# Patient Record
Sex: Male | Born: 1945 | Race: White | Hispanic: No | Marital: Married | Smoking: Former smoker
Health system: Southern US, Community
[De-identification: ages and names within clinical notes are randomized; demographics above are authoritative.]

## PROBLEM LIST (undated history)

## (undated) DIAGNOSIS — E669 Obesity, unspecified: Secondary | ICD-10-CM

## (undated) DIAGNOSIS — N529 Male erectile dysfunction, unspecified: Secondary | ICD-10-CM

## (undated) DIAGNOSIS — J4540 Moderate persistent asthma, uncomplicated: Secondary | ICD-10-CM

## (undated) DIAGNOSIS — R7989 Other specified abnormal findings of blood chemistry: Secondary | ICD-10-CM

## (undated) DIAGNOSIS — E7800 Pure hypercholesterolemia, unspecified: Secondary | ICD-10-CM

## (undated) DIAGNOSIS — K429 Umbilical hernia without obstruction or gangrene: Secondary | ICD-10-CM

## (undated) DIAGNOSIS — E291 Testicular hypofunction: Secondary | ICD-10-CM

## (undated) DIAGNOSIS — E559 Vitamin D deficiency, unspecified: Secondary | ICD-10-CM

## (undated) DIAGNOSIS — J339 Nasal polyp, unspecified: Secondary | ICD-10-CM

## (undated) DIAGNOSIS — B351 Tinea unguium: Secondary | ICD-10-CM

## (undated) DIAGNOSIS — J4520 Mild intermittent asthma, uncomplicated: Secondary | ICD-10-CM

## (undated) DIAGNOSIS — G4733 Obstructive sleep apnea (adult) (pediatric): Secondary | ICD-10-CM

## (undated) DIAGNOSIS — J45909 Unspecified asthma, uncomplicated: Secondary | ICD-10-CM

## (undated) DIAGNOSIS — J3089 Other allergic rhinitis: Secondary | ICD-10-CM

## (undated) DIAGNOSIS — F3341 Major depressive disorder, recurrent, in partial remission: Secondary | ICD-10-CM

## (undated) DIAGNOSIS — M7740 Metatarsalgia, unspecified foot: Secondary | ICD-10-CM

## (undated) DIAGNOSIS — I10 Essential (primary) hypertension: Secondary | ICD-10-CM

## (undated) DIAGNOSIS — E785 Hyperlipidemia, unspecified: Secondary | ICD-10-CM

## (undated) DIAGNOSIS — J309 Allergic rhinitis, unspecified: Secondary | ICD-10-CM

## (undated) HISTORY — DX: Unspecified asthma, uncomplicated: J45.909

## (undated) HISTORY — DX: Obesity, unspecified: E66.9

## (undated) HISTORY — DX: Tinea unguium: B35.1

## (undated) HISTORY — DX: Vitamin D deficiency, unspecified: E55.9

## (undated) HISTORY — DX: Nasal polyp, unspecified: J33.9

## (undated) HISTORY — DX: Other allergic rhinitis: J30.89

## (undated) HISTORY — DX: Male erectile dysfunction, unspecified: N52.9

## (undated) HISTORY — DX: Moderate persistent asthma, uncomplicated: J45.40

## (undated) HISTORY — DX: Allergic rhinitis, unspecified: J30.9

## (undated) HISTORY — DX: Mild intermittent asthma, uncomplicated: J45.20

## (undated) HISTORY — DX: Pure hypercholesterolemia, unspecified: E78.00

## (undated) HISTORY — DX: Hyperlipidemia, unspecified: E78.5

## (undated) HISTORY — PX: OTHER SURGICAL HISTORY: SHX169

## (undated) HISTORY — DX: Metatarsalgia, unspecified foot: M77.40

## (undated) HISTORY — PX: EYE SURGERY: SHX253

## (undated) HISTORY — DX: Testicular hypofunction: E29.1

## (undated) HISTORY — DX: Essential (primary) hypertension: I10

## (undated) HISTORY — DX: Major depressive disorder, recurrent, in partial remission: F33.41

## (undated) HISTORY — DX: Other specified abnormal findings of blood chemistry: R79.89

## (undated) HISTORY — DX: Obstructive sleep apnea (adult) (pediatric): G47.33

## (undated) HISTORY — PX: CARDIAC CATHETERIZATION: SHX172

---

## 2000-02-26 DIAGNOSIS — I10 Essential (primary) hypertension: Secondary | ICD-10-CM | POA: Insufficient documentation

## 2001-03-07 DIAGNOSIS — L209 Atopic dermatitis, unspecified: Secondary | ICD-10-CM | POA: Insufficient documentation

## 2001-03-16 DIAGNOSIS — E7800 Pure hypercholesterolemia, unspecified: Secondary | ICD-10-CM | POA: Insufficient documentation

## 2003-03-23 DIAGNOSIS — G4730 Sleep apnea, unspecified: Secondary | ICD-10-CM | POA: Insufficient documentation

## 2004-04-25 DIAGNOSIS — I872 Venous insufficiency (chronic) (peripheral): Secondary | ICD-10-CM | POA: Insufficient documentation

## 2008-11-09 DIAGNOSIS — E349 Endocrine disorder, unspecified: Secondary | ICD-10-CM | POA: Insufficient documentation

## 2008-11-09 DIAGNOSIS — N529 Male erectile dysfunction, unspecified: Secondary | ICD-10-CM | POA: Insufficient documentation

## 2012-07-28 DIAGNOSIS — J3089 Other allergic rhinitis: Secondary | ICD-10-CM | POA: Diagnosis not present

## 2012-08-18 DIAGNOSIS — L820 Inflamed seborrheic keratosis: Secondary | ICD-10-CM | POA: Diagnosis not present

## 2012-08-23 DIAGNOSIS — J3089 Other allergic rhinitis: Secondary | ICD-10-CM | POA: Diagnosis not present

## 2012-08-24 DIAGNOSIS — J3089 Other allergic rhinitis: Secondary | ICD-10-CM | POA: Diagnosis not present

## 2012-08-30 DIAGNOSIS — IMO0002 Reserved for concepts with insufficient information to code with codable children: Secondary | ICD-10-CM | POA: Diagnosis not present

## 2012-08-30 DIAGNOSIS — L509 Urticaria, unspecified: Secondary | ICD-10-CM | POA: Diagnosis not present

## 2012-08-30 DIAGNOSIS — I10 Essential (primary) hypertension: Secondary | ICD-10-CM | POA: Diagnosis not present

## 2012-08-30 DIAGNOSIS — T783XXA Angioneurotic edema, initial encounter: Secondary | ICD-10-CM | POA: Diagnosis not present

## 2012-08-31 DIAGNOSIS — J339 Nasal polyp, unspecified: Secondary | ICD-10-CM | POA: Diagnosis not present

## 2012-08-31 DIAGNOSIS — J3089 Other allergic rhinitis: Secondary | ICD-10-CM | POA: Diagnosis not present

## 2012-08-31 DIAGNOSIS — J449 Chronic obstructive pulmonary disease, unspecified: Secondary | ICD-10-CM | POA: Diagnosis not present

## 2012-08-31 DIAGNOSIS — J45902 Unspecified asthma with status asthmaticus: Secondary | ICD-10-CM | POA: Diagnosis not present

## 2012-08-31 DIAGNOSIS — J4489 Other specified chronic obstructive pulmonary disease: Secondary | ICD-10-CM | POA: Diagnosis not present

## 2012-08-31 DIAGNOSIS — J329 Chronic sinusitis, unspecified: Secondary | ICD-10-CM | POA: Diagnosis not present

## 2012-09-08 DIAGNOSIS — I10 Essential (primary) hypertension: Secondary | ICD-10-CM | POA: Diagnosis not present

## 2012-09-08 DIAGNOSIS — H6120 Impacted cerumen, unspecified ear: Secondary | ICD-10-CM | POA: Diagnosis not present

## 2012-09-20 DIAGNOSIS — J3089 Other allergic rhinitis: Secondary | ICD-10-CM | POA: Diagnosis not present

## 2012-10-19 DIAGNOSIS — J3089 Other allergic rhinitis: Secondary | ICD-10-CM | POA: Diagnosis not present

## 2012-11-03 DIAGNOSIS — J3089 Other allergic rhinitis: Secondary | ICD-10-CM | POA: Diagnosis not present

## 2012-11-18 DIAGNOSIS — J3089 Other allergic rhinitis: Secondary | ICD-10-CM | POA: Diagnosis not present

## 2012-12-15 DIAGNOSIS — J3089 Other allergic rhinitis: Secondary | ICD-10-CM | POA: Diagnosis not present

## 2012-12-27 DIAGNOSIS — G4733 Obstructive sleep apnea (adult) (pediatric): Secondary | ICD-10-CM | POA: Diagnosis not present

## 2013-01-16 DIAGNOSIS — J3089 Other allergic rhinitis: Secondary | ICD-10-CM | POA: Diagnosis not present

## 2013-02-13 DIAGNOSIS — J3089 Other allergic rhinitis: Secondary | ICD-10-CM | POA: Diagnosis not present

## 2013-03-07 DIAGNOSIS — T782XXA Anaphylactic shock, unspecified, initial encounter: Secondary | ICD-10-CM | POA: Diagnosis not present

## 2013-03-07 DIAGNOSIS — J339 Nasal polyp, unspecified: Secondary | ICD-10-CM | POA: Diagnosis not present

## 2013-03-07 DIAGNOSIS — J3089 Other allergic rhinitis: Secondary | ICD-10-CM | POA: Diagnosis not present

## 2013-03-07 DIAGNOSIS — G4733 Obstructive sleep apnea (adult) (pediatric): Secondary | ICD-10-CM | POA: Diagnosis not present

## 2013-03-07 DIAGNOSIS — J328 Other chronic sinusitis: Secondary | ICD-10-CM | POA: Diagnosis not present

## 2013-03-07 DIAGNOSIS — J4489 Other specified chronic obstructive pulmonary disease: Secondary | ICD-10-CM | POA: Diagnosis not present

## 2013-03-07 DIAGNOSIS — J449 Chronic obstructive pulmonary disease, unspecified: Secondary | ICD-10-CM | POA: Diagnosis not present

## 2013-03-23 DIAGNOSIS — J3089 Other allergic rhinitis: Secondary | ICD-10-CM | POA: Diagnosis not present

## 2013-04-06 DIAGNOSIS — J3089 Other allergic rhinitis: Secondary | ICD-10-CM | POA: Diagnosis not present

## 2013-04-26 DIAGNOSIS — J3089 Other allergic rhinitis: Secondary | ICD-10-CM | POA: Diagnosis not present

## 2013-05-03 DIAGNOSIS — Z125 Encounter for screening for malignant neoplasm of prostate: Secondary | ICD-10-CM | POA: Diagnosis not present

## 2013-05-03 DIAGNOSIS — I10 Essential (primary) hypertension: Secondary | ICD-10-CM | POA: Diagnosis not present

## 2013-05-03 DIAGNOSIS — Z136 Encounter for screening for cardiovascular disorders: Secondary | ICD-10-CM | POA: Diagnosis not present

## 2013-05-03 DIAGNOSIS — Z23 Encounter for immunization: Secondary | ICD-10-CM | POA: Diagnosis not present

## 2013-05-03 DIAGNOSIS — E7800 Pure hypercholesterolemia, unspecified: Secondary | ICD-10-CM | POA: Diagnosis not present

## 2013-05-03 DIAGNOSIS — Z0000 Encounter for general adult medical examination without abnormal findings: Secondary | ICD-10-CM | POA: Diagnosis not present

## 2013-05-08 DIAGNOSIS — J45909 Unspecified asthma, uncomplicated: Secondary | ICD-10-CM | POA: Diagnosis not present

## 2013-05-08 DIAGNOSIS — G4733 Obstructive sleep apnea (adult) (pediatric): Secondary | ICD-10-CM | POA: Diagnosis not present

## 2013-05-15 DIAGNOSIS — Z87891 Personal history of nicotine dependence: Secondary | ICD-10-CM | POA: Diagnosis not present

## 2013-05-15 DIAGNOSIS — Z136 Encounter for screening for cardiovascular disorders: Secondary | ICD-10-CM | POA: Diagnosis not present

## 2013-06-05 DIAGNOSIS — J3089 Other allergic rhinitis: Secondary | ICD-10-CM | POA: Diagnosis not present

## 2013-06-06 DIAGNOSIS — J3089 Other allergic rhinitis: Secondary | ICD-10-CM | POA: Diagnosis not present

## 2013-06-20 DIAGNOSIS — J3089 Other allergic rhinitis: Secondary | ICD-10-CM | POA: Diagnosis not present

## 2013-07-06 DIAGNOSIS — J3089 Other allergic rhinitis: Secondary | ICD-10-CM | POA: Diagnosis not present

## 2013-07-24 DIAGNOSIS — J3089 Other allergic rhinitis: Secondary | ICD-10-CM | POA: Diagnosis not present

## 2013-08-07 DIAGNOSIS — J3089 Other allergic rhinitis: Secondary | ICD-10-CM | POA: Diagnosis not present

## 2013-08-17 DIAGNOSIS — J111 Influenza due to unidentified influenza virus with other respiratory manifestations: Secondary | ICD-10-CM | POA: Diagnosis not present

## 2013-08-31 DIAGNOSIS — J45909 Unspecified asthma, uncomplicated: Secondary | ICD-10-CM | POA: Diagnosis not present

## 2013-08-31 DIAGNOSIS — J3089 Other allergic rhinitis: Secondary | ICD-10-CM | POA: Diagnosis not present

## 2013-08-31 DIAGNOSIS — G4733 Obstructive sleep apnea (adult) (pediatric): Secondary | ICD-10-CM | POA: Diagnosis not present

## 2013-09-06 DIAGNOSIS — J339 Nasal polyp, unspecified: Secondary | ICD-10-CM | POA: Diagnosis not present

## 2013-09-06 DIAGNOSIS — L508 Other urticaria: Secondary | ICD-10-CM | POA: Diagnosis not present

## 2013-09-06 DIAGNOSIS — J329 Chronic sinusitis, unspecified: Secondary | ICD-10-CM | POA: Diagnosis not present

## 2013-09-06 DIAGNOSIS — J45902 Unspecified asthma with status asthmaticus: Secondary | ICD-10-CM | POA: Diagnosis not present

## 2013-09-06 DIAGNOSIS — J3089 Other allergic rhinitis: Secondary | ICD-10-CM | POA: Diagnosis not present

## 2013-09-06 DIAGNOSIS — J4489 Other specified chronic obstructive pulmonary disease: Secondary | ICD-10-CM | POA: Diagnosis not present

## 2013-09-06 DIAGNOSIS — J449 Chronic obstructive pulmonary disease, unspecified: Secondary | ICD-10-CM | POA: Diagnosis not present

## 2013-09-06 DIAGNOSIS — G4733 Obstructive sleep apnea (adult) (pediatric): Secondary | ICD-10-CM | POA: Diagnosis not present

## 2013-09-06 LAB — PULMONARY FUNCTION TEST

## 2013-09-22 DIAGNOSIS — J3089 Other allergic rhinitis: Secondary | ICD-10-CM | POA: Diagnosis not present

## 2013-10-17 DIAGNOSIS — J3089 Other allergic rhinitis: Secondary | ICD-10-CM | POA: Diagnosis not present

## 2013-10-17 DIAGNOSIS — I10 Essential (primary) hypertension: Secondary | ICD-10-CM | POA: Diagnosis not present

## 2013-10-17 DIAGNOSIS — E7800 Pure hypercholesterolemia, unspecified: Secondary | ICD-10-CM | POA: Diagnosis not present

## 2013-10-17 DIAGNOSIS — H6120 Impacted cerumen, unspecified ear: Secondary | ICD-10-CM | POA: Diagnosis not present

## 2013-11-01 DIAGNOSIS — J3089 Other allergic rhinitis: Secondary | ICD-10-CM | POA: Diagnosis not present

## 2013-11-23 DIAGNOSIS — J45902 Unspecified asthma with status asthmaticus: Secondary | ICD-10-CM | POA: Diagnosis not present

## 2013-11-23 DIAGNOSIS — J4489 Other specified chronic obstructive pulmonary disease: Secondary | ICD-10-CM | POA: Diagnosis not present

## 2013-11-23 DIAGNOSIS — T783XXA Angioneurotic edema, initial encounter: Secondary | ICD-10-CM | POA: Diagnosis not present

## 2013-11-23 DIAGNOSIS — J339 Nasal polyp, unspecified: Secondary | ICD-10-CM | POA: Diagnosis not present

## 2013-11-23 DIAGNOSIS — J449 Chronic obstructive pulmonary disease, unspecified: Secondary | ICD-10-CM | POA: Diagnosis not present

## 2013-11-23 DIAGNOSIS — L508 Other urticaria: Secondary | ICD-10-CM | POA: Diagnosis not present

## 2013-11-23 DIAGNOSIS — J329 Chronic sinusitis, unspecified: Secondary | ICD-10-CM | POA: Diagnosis not present

## 2013-11-23 DIAGNOSIS — J3089 Other allergic rhinitis: Secondary | ICD-10-CM | POA: Diagnosis not present

## 2013-11-23 LAB — PULMONARY FUNCTION TEST

## 2013-11-29 DIAGNOSIS — J3089 Other allergic rhinitis: Secondary | ICD-10-CM | POA: Diagnosis not present

## 2013-12-21 DIAGNOSIS — J3089 Other allergic rhinitis: Secondary | ICD-10-CM | POA: Diagnosis not present

## 2013-12-22 DIAGNOSIS — J3089 Other allergic rhinitis: Secondary | ICD-10-CM | POA: Diagnosis not present

## 2014-01-10 DIAGNOSIS — J3089 Other allergic rhinitis: Secondary | ICD-10-CM | POA: Diagnosis not present

## 2014-01-24 DIAGNOSIS — J3089 Other allergic rhinitis: Secondary | ICD-10-CM | POA: Diagnosis not present

## 2014-02-06 DIAGNOSIS — J3089 Other allergic rhinitis: Secondary | ICD-10-CM | POA: Diagnosis not present

## 2014-02-28 DIAGNOSIS — J45909 Unspecified asthma, uncomplicated: Secondary | ICD-10-CM | POA: Diagnosis not present

## 2014-02-28 DIAGNOSIS — J3089 Other allergic rhinitis: Secondary | ICD-10-CM | POA: Diagnosis not present

## 2014-02-28 DIAGNOSIS — G4733 Obstructive sleep apnea (adult) (pediatric): Secondary | ICD-10-CM | POA: Diagnosis not present

## 2014-03-20 DIAGNOSIS — H43819 Vitreous degeneration, unspecified eye: Secondary | ICD-10-CM | POA: Diagnosis not present

## 2014-03-20 DIAGNOSIS — H26499 Other secondary cataract, unspecified eye: Secondary | ICD-10-CM | POA: Diagnosis not present

## 2014-03-20 DIAGNOSIS — J3089 Other allergic rhinitis: Secondary | ICD-10-CM | POA: Diagnosis not present

## 2014-04-01 DIAGNOSIS — G4733 Obstructive sleep apnea (adult) (pediatric): Secondary | ICD-10-CM | POA: Diagnosis not present

## 2014-04-11 DIAGNOSIS — J449 Chronic obstructive pulmonary disease, unspecified: Secondary | ICD-10-CM | POA: Diagnosis not present

## 2014-04-11 DIAGNOSIS — T783XXA Angioneurotic edema, initial encounter: Secondary | ICD-10-CM | POA: Diagnosis not present

## 2014-04-11 DIAGNOSIS — G4733 Obstructive sleep apnea (adult) (pediatric): Secondary | ICD-10-CM | POA: Diagnosis not present

## 2014-04-11 DIAGNOSIS — J338 Other polyp of sinus: Secondary | ICD-10-CM | POA: Diagnosis not present

## 2014-04-11 DIAGNOSIS — J329 Chronic sinusitis, unspecified: Secondary | ICD-10-CM | POA: Diagnosis not present

## 2014-04-11 DIAGNOSIS — J4489 Other specified chronic obstructive pulmonary disease: Secondary | ICD-10-CM | POA: Diagnosis not present

## 2014-04-11 DIAGNOSIS — J45902 Unspecified asthma with status asthmaticus: Secondary | ICD-10-CM | POA: Diagnosis not present

## 2014-04-11 DIAGNOSIS — J3089 Other allergic rhinitis: Secondary | ICD-10-CM | POA: Diagnosis not present

## 2014-04-11 LAB — PULMONARY FUNCTION TEST

## 2014-04-19 DIAGNOSIS — G4733 Obstructive sleep apnea (adult) (pediatric): Secondary | ICD-10-CM | POA: Diagnosis not present

## 2014-04-23 DIAGNOSIS — H26493 Other secondary cataract, bilateral: Secondary | ICD-10-CM | POA: Diagnosis not present

## 2014-04-23 DIAGNOSIS — H35373 Puckering of macula, bilateral: Secondary | ICD-10-CM | POA: Diagnosis not present

## 2014-04-23 DIAGNOSIS — H43811 Vitreous degeneration, right eye: Secondary | ICD-10-CM | POA: Diagnosis not present

## 2014-04-23 DIAGNOSIS — H524 Presbyopia: Secondary | ICD-10-CM | POA: Diagnosis not present

## 2014-05-07 DIAGNOSIS — J302 Other seasonal allergic rhinitis: Secondary | ICD-10-CM | POA: Diagnosis not present

## 2014-05-10 DIAGNOSIS — Z23 Encounter for immunization: Secondary | ICD-10-CM | POA: Diagnosis not present

## 2014-05-21 DIAGNOSIS — E785 Hyperlipidemia, unspecified: Secondary | ICD-10-CM | POA: Diagnosis not present

## 2014-05-21 DIAGNOSIS — Z125 Encounter for screening for malignant neoplasm of prostate: Secondary | ICD-10-CM | POA: Diagnosis not present

## 2014-05-21 DIAGNOSIS — I509 Heart failure, unspecified: Secondary | ICD-10-CM | POA: Diagnosis not present

## 2014-05-21 DIAGNOSIS — E291 Testicular hypofunction: Secondary | ICD-10-CM | POA: Diagnosis not present

## 2014-05-21 DIAGNOSIS — J309 Allergic rhinitis, unspecified: Secondary | ICD-10-CM | POA: Diagnosis not present

## 2014-05-31 DIAGNOSIS — J9801 Acute bronchospasm: Secondary | ICD-10-CM | POA: Diagnosis not present

## 2014-05-31 DIAGNOSIS — J218 Acute bronchiolitis due to other specified organisms: Secondary | ICD-10-CM | POA: Diagnosis not present

## 2014-06-04 DIAGNOSIS — J302 Other seasonal allergic rhinitis: Secondary | ICD-10-CM | POA: Diagnosis not present

## 2014-06-05 ENCOUNTER — Encounter: Payer: Self-pay | Admitting: Pulmonary Disease

## 2014-06-05 ENCOUNTER — Encounter (INDEPENDENT_AMBULATORY_CARE_PROVIDER_SITE_OTHER): Payer: Self-pay

## 2014-06-05 ENCOUNTER — Ambulatory Visit (INDEPENDENT_AMBULATORY_CARE_PROVIDER_SITE_OTHER): Payer: Medicare Other | Admitting: Pulmonary Disease

## 2014-06-05 VITALS — BP 124/70 | HR 69 | Temp 97.0°F | Ht 70.0 in | Wt 313.2 lb

## 2014-06-05 DIAGNOSIS — G4733 Obstructive sleep apnea (adult) (pediatric): Secondary | ICD-10-CM | POA: Diagnosis not present

## 2014-06-05 DIAGNOSIS — J45909 Unspecified asthma, uncomplicated: Secondary | ICD-10-CM | POA: Diagnosis not present

## 2014-06-05 DIAGNOSIS — I509 Heart failure, unspecified: Secondary | ICD-10-CM | POA: Diagnosis not present

## 2014-06-05 DIAGNOSIS — E785 Hyperlipidemia, unspecified: Secondary | ICD-10-CM | POA: Diagnosis not present

## 2014-06-05 DIAGNOSIS — I10 Essential (primary) hypertension: Secondary | ICD-10-CM | POA: Diagnosis not present

## 2014-06-05 NOTE — Progress Notes (Signed)
Subjective:    Patient ID: Erik Wiggins, male    DOB: 1946/07/07, 68 y.o.   MRN: [id number]  HPI The patient is a 68 year old male who comes in today as a self-referral to establish for management of obstructive sleep apnea. He underwent a sleep study in 2012 in [place] which showed severe obstructive sleep apnea, and was started on C Pap appropriately. He is currently on a pressure of 14 cm of water, and uses a full face mask. His supplies are fairly up-to-date. He feels that he has done very well with the C Pap device, and currently is satisfied with his sleep and daytime alertness. He feels rested in the mornings upon arising. He has just moved to the [place] area, and needs to establish with a home care company. His current machine is only about 75-38 years old.   Sleep Questionnaire What time do you typically go to bed?( Between what hours) 10pm-12AM 10pm-12AM at 1416 on 06/05/14 by Inge Rise, CMA How long does it take you to fall asleep? 10 min 10 min at 1416 on 06/05/14 by Inge Rise, CMA How many times during the night do you wake up? 4 4 at 1416 on 06/05/14 by Inge Rise, CMA What time do you get out of bed to start your day? 0730 0730 at 1416 on 06/05/14 by Inge Rise, CMA Do you drive or operate heavy machinery in your occupation? No No at 1416 on 06/05/14 by Inge Rise, CMA How much has your weight changed (up or down) over the past two years? (In pounds) 10 lb (4.536 kg) 10 lb (4.536 kg) at 1416 on 06/05/14 by Inge Rise, CMA Have you ever had a sleep study before? Yes Yes at 1416 on 06/05/14 by Inge Rise, CMA If yes, location of study? [place] at 1416 on 06/05/14 by Inge Rise, CMA If yes, date of study? 3-4 years ago 3-4 years ago at 1416 on 06/05/14 by Inge Rise, CMA Do you currently use CPAP? Yes Yes at 1416 on 06/05/14 by Inge Rise, CMA If so, what pressure? 14 14 at 1416 on 06/05/14 by Inge Rise, CMA  Do you wear oxygen at any time? No No at 1416 on 06/05/14 by Inge Rise, CMA   Review of Systems  Constitutional: Negative for fever and unexpected weight change.  HENT: Positive for congestion. Negative for dental problem, ear pain, nosebleeds, postnasal drip, rhinorrhea, sinus pressure, sneezing, sore throat and trouble swallowing.   Eyes: Negative for redness and itching.  Respiratory: Positive for cough and shortness of breath. Negative for chest tightness and wheezing.   Cardiovascular: Negative for palpitations and leg swelling.  Gastrointestinal: Negative for nausea and vomiting.  Genitourinary: Negative for dysuria.  Musculoskeletal: Positive for arthralgias. Negative for joint swelling.  Skin: Negative for rash.  Neurological: Negative for headaches.  Hematological: Does not bruise/bleed easily.  Psychiatric/Behavioral: Negative for dysphoric mood. The patient is not nervous/anxious.        Objective:   Physical Exam Constitutional:  Obese male, no acute distress  HENT:  Nares patent without discharge, large turbinates noted.  Oropharynx without exudate, palate and uvula are thick and elongated.  Eyes:  Perrla, eomi, no scleral icterus  Neck:  No JVD, no TMG  Cardiovascular:  Normal rate, regular rhythm, no rubs or gallops.  No murmurs        Intact distal pulses  Pulmonary :  Normal  breath sounds, no stridor or respiratory distress   No rales, rhonchi, or wheezing  Abdominal:  Soft, nondistended, bowel sounds present.  No tenderness noted.   Musculoskeletal:  mild lower extremity edema noted.  Lymph Nodes:  No cervical lymphadenopathy noted  Skin:  No cyanosis noted  Neurologic:  Alert, appropriate, moves all 4 extremities without obvious deficit.         Assessment & Plan:

## 2014-06-05 NOTE — Patient Instructions (Signed)
Will refer to a new home care company here in town for supplies. Work on weight loss If doing well, followup with me again in one year.

## 2014-06-05 NOTE — Assessment & Plan Note (Signed)
The patient has a history of severe obstructive sleep apnea, but has done very well since being on C Pap. He has now moved to the [place] area, and wishes to establish with a home care company. He feels that he sleeps well with his device, has a mask that fits very well, and is satisfied with his daytime alertness. I have asked him to keep up with his mask changes and supplies, and to work aggressively on weight loss. I will see him back in one year if he continues to do well.

## 2014-06-21 ENCOUNTER — Encounter: Payer: Self-pay | Admitting: Internal Medicine

## 2014-06-21 ENCOUNTER — Ambulatory Visit (INDEPENDENT_AMBULATORY_CARE_PROVIDER_SITE_OTHER): Payer: Medicare Other | Admitting: Internal Medicine

## 2014-06-21 VITALS — BP 130/62 | HR 70 | Ht 70.0 in | Wt 314.4 lb

## 2014-06-21 DIAGNOSIS — J3089 Other allergic rhinitis: Secondary | ICD-10-CM

## 2014-06-21 DIAGNOSIS — J339 Nasal polyp, unspecified: Secondary | ICD-10-CM

## 2014-06-21 DIAGNOSIS — J302 Other seasonal allergic rhinitis: Secondary | ICD-10-CM

## 2014-06-21 DIAGNOSIS — E669 Obesity, unspecified: Secondary | ICD-10-CM | POA: Diagnosis not present

## 2014-06-21 DIAGNOSIS — G4733 Obstructive sleep apnea (adult) (pediatric): Secondary | ICD-10-CM | POA: Diagnosis not present

## 2014-06-21 DIAGNOSIS — J309 Allergic rhinitis, unspecified: Secondary | ICD-10-CM | POA: Diagnosis not present

## 2014-06-21 HISTORY — DX: Obesity, unspecified: E66.9

## 2014-06-21 HISTORY — DX: Other allergic rhinitis: J30.89

## 2014-06-21 HISTORY — DX: Nasal polyp, unspecified: J33.9

## 2014-06-21 NOTE — Assessment & Plan Note (Signed)
He feels allergy vaccine has helped.  Plan- records requested. We can give his shots here and take over as his current vaccine runs out. Medication adjustment as needed.

## 2014-06-21 NOTE — Assessment & Plan Note (Signed)
Managed by Dr Gwenette Greet. Obesity, very narrow pharynx and nasal congestion all contribute.

## 2014-06-21 NOTE — Assessment & Plan Note (Signed)
Plan-continue nasal steroid with discussion

## 2014-06-21 NOTE — Assessment & Plan Note (Signed)
Significant obesity Plan-encourage weight loss

## 2014-06-21 NOTE — Patient Instructions (Signed)
When we get your records and left-over vaccine we will be able to continue your allergy vaccine here.   Please call if we can help

## 2014-06-21 NOTE — Progress Notes (Signed)
06/21/14- 66 yoM former smoker Self referral; moved here about 1 year ago and wants to establish here for allergy as well. Currently on vaccine through Dr Suanne Marker,  ([place]) Followed here by Dr Gwenette Greet for OSA             Has had flu vax History of allergic rhinitis, worse in spring and fall but perennial. Allergy evaluation in [place] with positive skin tests and started allergy vaccine in 2012. He feels he has been doing better on allergy shots. Wants to continue here. We are requesting leftover vaccine and shot records from his previous allergist. History of nasal polyps on CT scan. No recognized problems with aspirin and takes daily BASA. No ENT surgery. Asthma-mild intermittent, using only occasional rescue inhaler. He is unclear about specific triggers other than seasonal pollen changes.   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 Living in new home he built himself. Small dogs, no mold, no smokers. Dry skin often itches but without history of urticaria or atopic dermatitis. Denies food or insect sting problems.  Prior to Admission medications   Medication Sig Start Date End Date Taking? Authorizing Provider  ADVAIR DISKUS 250-50 MCG/DOSE AEPB 1 puff twice daily 05/31/14  Yes Historical Provider, MD  aspirin 81 MG tablet Take 81 mg by mouth daily.   Yes Historical Provider, MD  indapamide (LOZOL) 2.5 MG tablet Once dailt 05/31/14  Yes Historical Provider, MD  ipratropium (ATROVENT) 0.03 % nasal spray As needed 05/16/14  Yes Historical Provider, MD  montelukast (SINGULAIR) 10 MG tablet Once daily 05/31/14  Yes Historical Provider, MD  PROAIR HFA 108 (90 BASE) MCG/ACT inhaler 2 puffs every 6 hrs as needed 05/31/14  Yes Historical Provider, MD  simvastatin (ZOCOR) 40 MG tablet Once daily 05/31/14  Yes Historical Provider, MD  triamcinolone (NASACORT ALLERGY 24HR) 55 MCG/ACT AERO nasal inhaler Place 1 spray into the nose 2 (two) times daily.   Yes  Historical Provider, MD  testosterone cypionate (DEPOTESTOTERONE CYPIONATE) 100 MG/ML injection  06/05/14   Historical Provider, MD   Past Medical History  Diagnosis Date  . Hypertension   . Asthma   . High cholesterol   . OSA (obstructive sleep apnea)    No past surgical history on file. Family History  Problem Relation Age of Onset  . Cancer Maternal Grandfather   . Heart attack Father   . Multiple sclerosis Son     youngest   History   Social History  . Marital Status: Married    Spouse Name: N/A    Number of Children: 3  . Years of Education: N/A   Occupational History  . retired-warehouse supervisor    Social History Main Topics  . Smoking status: Former Smoker -- 1 years    Types: Cigars    Quit date: 07/20/1998  . Smokeless tobacco: Not on file  . Alcohol Use: No  . Drug Use: No  . Sexual Activity: Not on file   Other Topics Concern  . Not on file   Social History Narrative   ROS-see HPI Constitutional:   No-   weight loss, night sweats, fevers, chills, fatigue, lassitude. HEENT:   No-  headaches, difficulty swallowing, tooth/dental problems, sore throat,      + sneezing, itching, ear ache, + nasal congestion, + post nasal drip,  CV:  No-   chest pain, orthopnea, PND, swelling in lower extremities, anasarca,  dizziness, palpitations Resp: +  shortness of breath with exertion or at rest.              No-   productive cough,  + non-productive cough,  No- coughing up of blood.              No-   change in color of mucus.  No- wheezing.   Skin: No-   rash or lesions. GI:  No-   heartburn, indigestion, abdominal pain, nausea, vomiting, diarrhea,                 change in bowel habits, loss of appetite GU: No-   dysuria, change in color of urine, no urgency or frequency.  No- flank pain. MS:  + joint pain or swelling.  No- decreased range of motion.  No- back pain. Neuro-     nothing unusual Psych:  No- change in mood or  affect. No depression or anxiety.  No memory loss.  OBJ- Physical Exam General- Alert, Oriented, Affect-appropriate, Distress- none acute, + obese Skin- rash-none, lesions- none, excoriation- none, + dry hyperkeratotic Lymphadenopathy- none Head- atraumatic            Eyes- Gross vision intact, PERRLA, conjunctivae and secretions clear            Ears- Hearing, canals-normal            Nose- Clear, no-Septal dev, mucus, no-polyps, erosion, perforation             Throat- Mallampati IV , mucosa clear , drainage- none, tonsils- atrophic Neck- flexible , trachea midline, no stridor , thyroid nl, carotid no bruit Chest - symmetrical excursion , unlabored           Heart/CV- RRR , no murmur , no gallop  , no rub, nl s1 s2                           - JVD- none , edema- none, stasis changes- none, varices- none           Lung- clear to P&A, wheeze- none, cough- none , dullness-none, rub- none           Chest wall-  Abd- tender-no, distended-no, bowel sounds-present, HSM- no Br/ Gen/ Rectal- Not done, not indicated Extrem- cyanosis- none, clubbing, none, atrophy- none, strength- nl Neuro- grossly intact to observation

## 2014-06-22 DIAGNOSIS — E291 Testicular hypofunction: Secondary | ICD-10-CM | POA: Diagnosis not present

## 2014-06-28 ENCOUNTER — Telehealth: Payer: Self-pay

## 2014-06-28 ENCOUNTER — Telehealth: Payer: Self-pay | Admitting: Internal Medicine

## 2014-06-28 NOTE — Telephone Encounter (Signed)
Pt called back & states he is going to [facility] where his serum is. He is going to get his shot there and will get the serum & bring it back with him - keep it in the frig & will bring to our office on Monday.

## 2014-06-28 NOTE — Telephone Encounter (Signed)
Spoke with patient-he will get his allergy injection tomorrow at [facility] office and will ask about bringing serum here with him and continue shots here in our office. Pt is aware to call the office to speak with me directly if any troubles while at [place] office or they need to confirm patient will be bringing serum to our office. Nothing more needed at this time.

## 2014-06-28 NOTE — Telephone Encounter (Signed)
Err

## 2014-06-29 DIAGNOSIS — J302 Other seasonal allergic rhinitis: Secondary | ICD-10-CM | POA: Diagnosis not present

## 2014-07-04 ENCOUNTER — Emergency Department (HOSPITAL_BASED_OUTPATIENT_CLINIC_OR_DEPARTMENT_OTHER)
Admission: EM | Admit: 2014-07-04 | Discharge: 2014-07-04 | Disposition: A | Discharge status: Home / Self Care | Payer: Medicare Other | Attending: Emergency Medicine | Admitting: Emergency Medicine

## 2014-07-04 ENCOUNTER — Emergency Department (HOSPITAL_BASED_OUTPATIENT_CLINIC_OR_DEPARTMENT_OTHER): Payer: Medicare Other

## 2014-07-04 ENCOUNTER — Encounter (HOSPITAL_BASED_OUTPATIENT_CLINIC_OR_DEPARTMENT_OTHER): Payer: Self-pay | Admitting: *Deleted

## 2014-07-04 DIAGNOSIS — Z87891 Personal history of nicotine dependence: Secondary | ICD-10-CM | POA: Diagnosis not present

## 2014-07-04 DIAGNOSIS — E780 Pure hypercholesterolemia: Secondary | ICD-10-CM | POA: Diagnosis not present

## 2014-07-04 DIAGNOSIS — X58XXXA Exposure to other specified factors, initial encounter: Secondary | ICD-10-CM | POA: Diagnosis not present

## 2014-07-04 DIAGNOSIS — Y9289 Other specified places as the place of occurrence of the external cause: Secondary | ICD-10-CM | POA: Diagnosis not present

## 2014-07-04 DIAGNOSIS — S93401A Sprain of unspecified ligament of right ankle, initial encounter: Secondary | ICD-10-CM | POA: Insufficient documentation

## 2014-07-04 DIAGNOSIS — I10 Essential (primary) hypertension: Secondary | ICD-10-CM | POA: Insufficient documentation

## 2014-07-04 DIAGNOSIS — Z7951 Long term (current) use of inhaled steroids: Secondary | ICD-10-CM | POA: Diagnosis not present

## 2014-07-04 DIAGNOSIS — Y998 Other external cause status: Secondary | ICD-10-CM | POA: Diagnosis not present

## 2014-07-04 DIAGNOSIS — Z79899 Other long term (current) drug therapy: Secondary | ICD-10-CM | POA: Diagnosis not present

## 2014-07-04 DIAGNOSIS — Z8669 Personal history of other diseases of the nervous system and sense organs: Secondary | ICD-10-CM | POA: Diagnosis not present

## 2014-07-04 DIAGNOSIS — M25571 Pain in right ankle and joints of right foot: Secondary | ICD-10-CM | POA: Diagnosis not present

## 2014-07-04 DIAGNOSIS — Y9389 Activity, other specified: Secondary | ICD-10-CM | POA: Insufficient documentation

## 2014-07-04 DIAGNOSIS — S99921A Unspecified injury of right foot, initial encounter: Secondary | ICD-10-CM | POA: Insufficient documentation

## 2014-07-04 DIAGNOSIS — J45909 Unspecified asthma, uncomplicated: Secondary | ICD-10-CM | POA: Diagnosis not present

## 2014-07-04 DIAGNOSIS — M79671 Pain in right foot: Secondary | ICD-10-CM | POA: Diagnosis not present

## 2014-07-04 DIAGNOSIS — S99911A Unspecified injury of right ankle, initial encounter: Secondary | ICD-10-CM | POA: Diagnosis present

## 2014-07-04 DIAGNOSIS — T1490XA Injury, unspecified, initial encounter: Secondary | ICD-10-CM

## 2014-07-04 DIAGNOSIS — S99929A Unspecified injury of unspecified foot, initial encounter: Secondary | ICD-10-CM

## 2014-07-04 IMAGING — CR DG ANKLE COMPLETE 3+V*R*
3 series · 3 of 3 positions shown · non-contrast
Comparison: None.

CLINICAL DATA: Right ankle twisting injury, pain

EXAM:
RIGHT ANKLE - COMPLETE 3+ VIEW

[t ankle joint ap right]
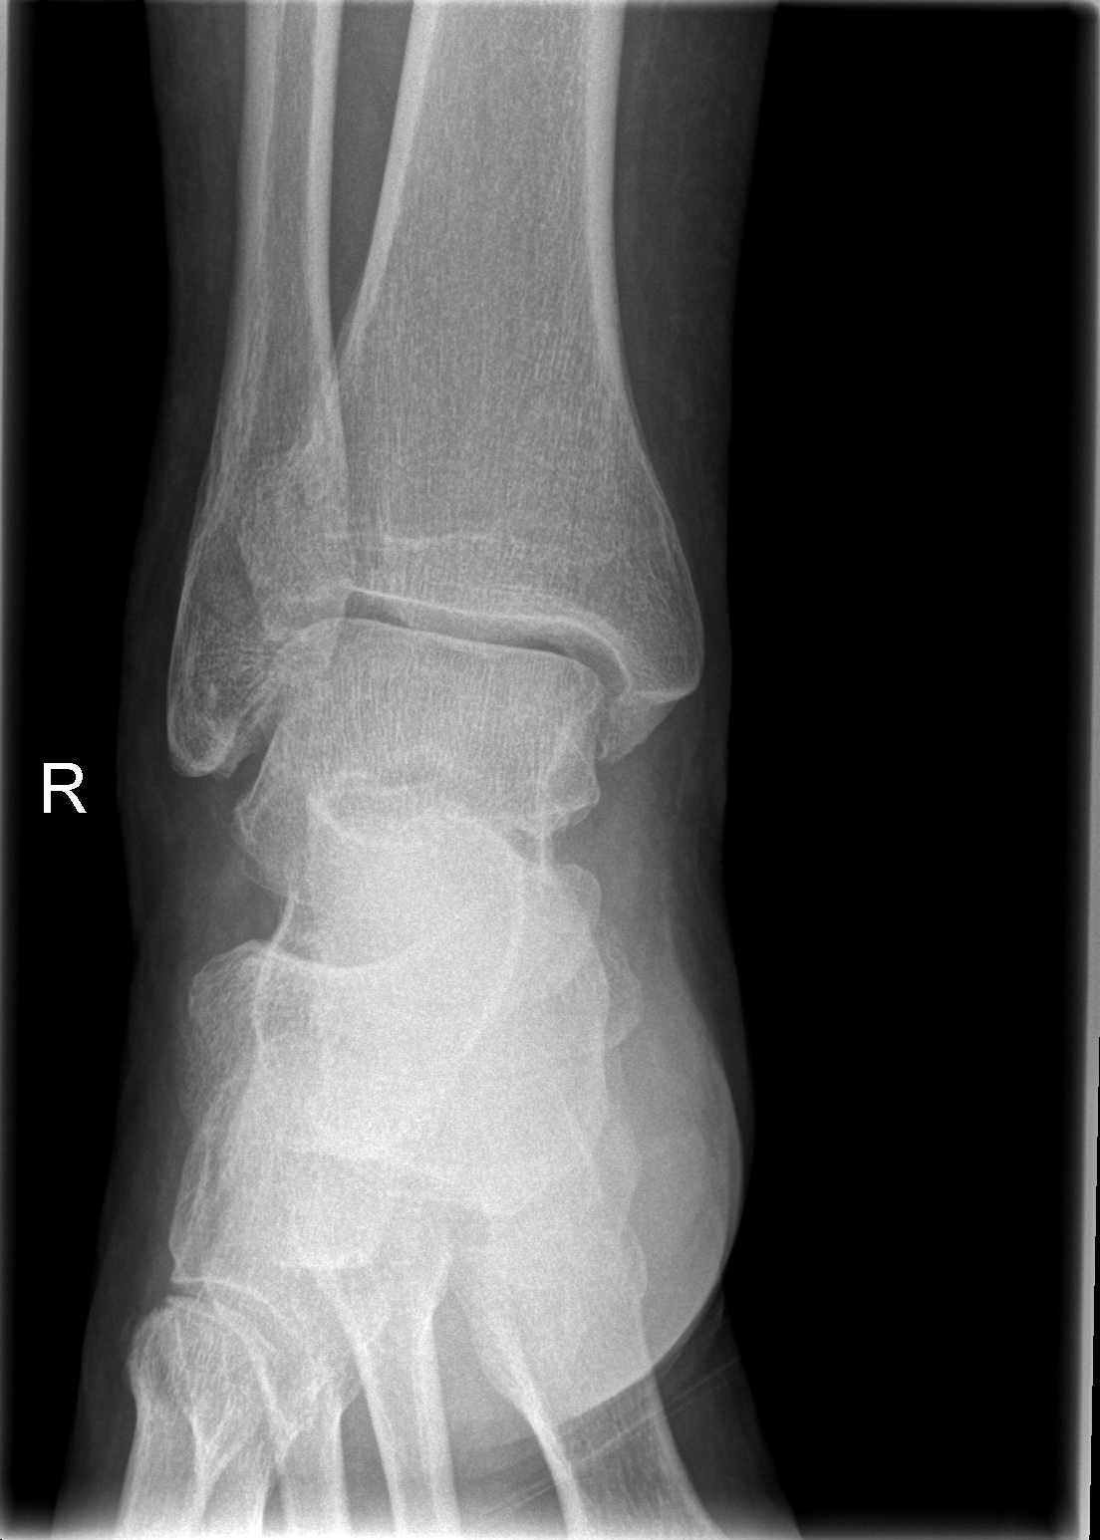

[t ankle joint oblique right]
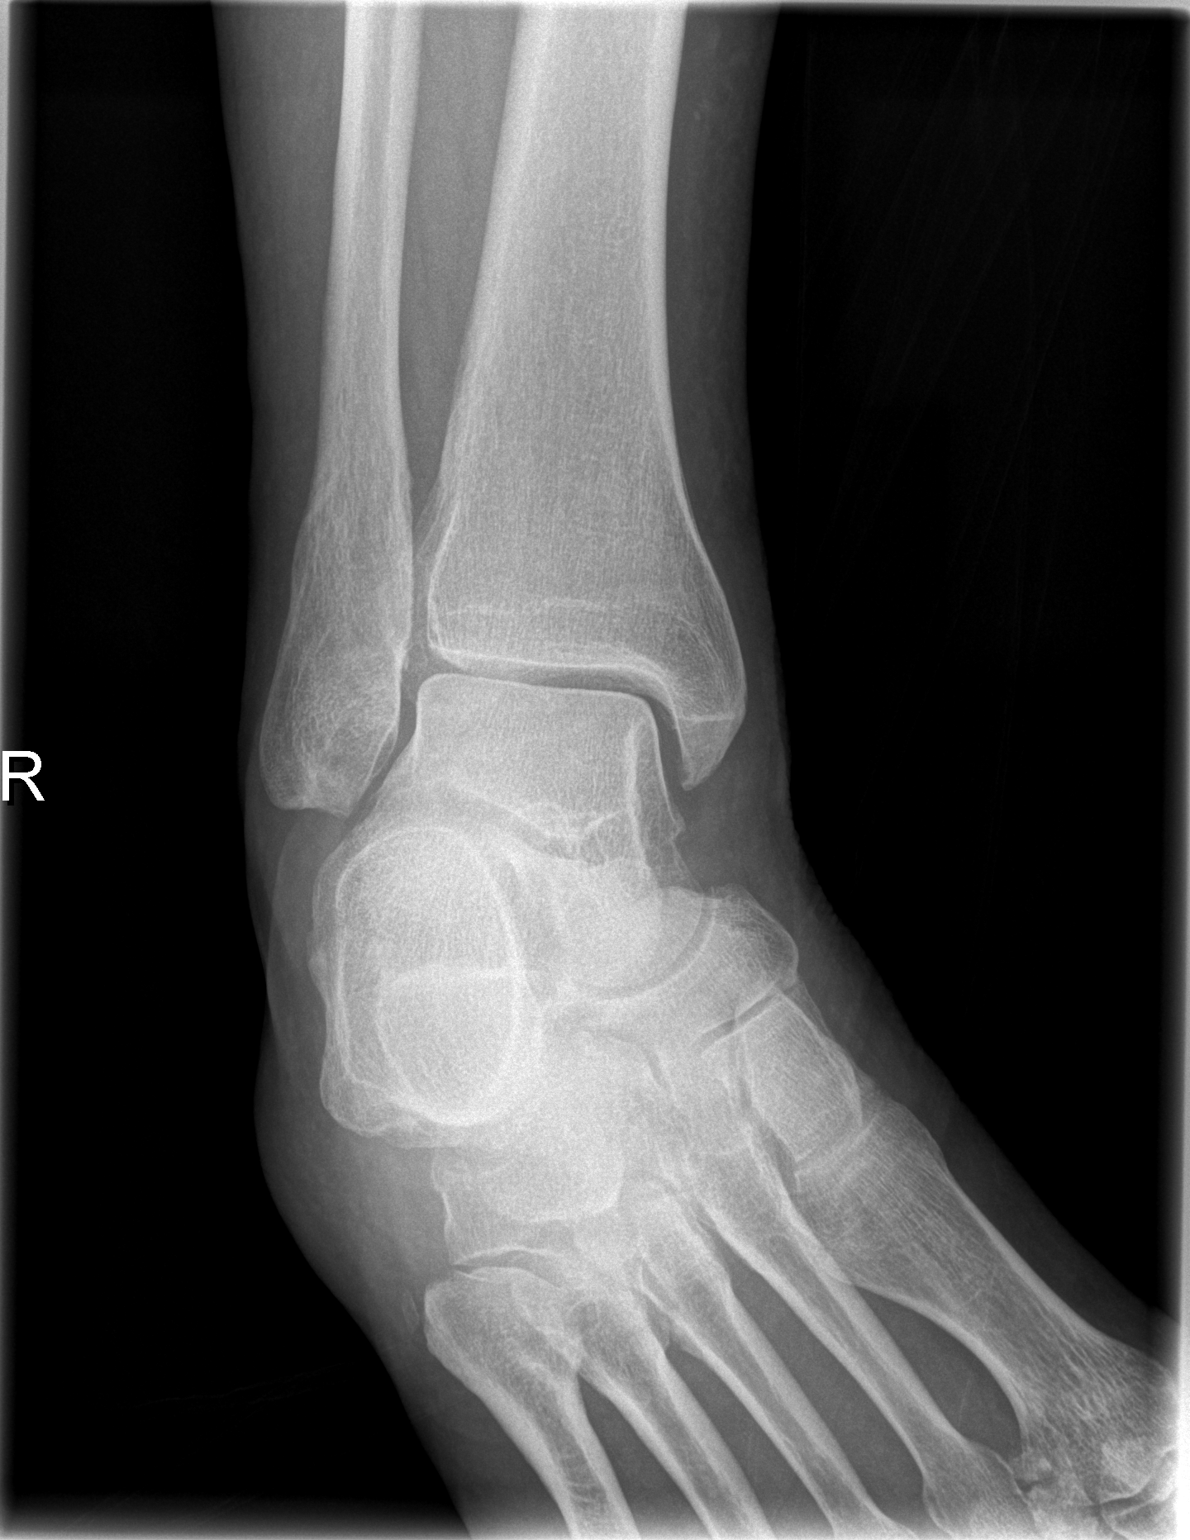

[t ankle joint lat right]
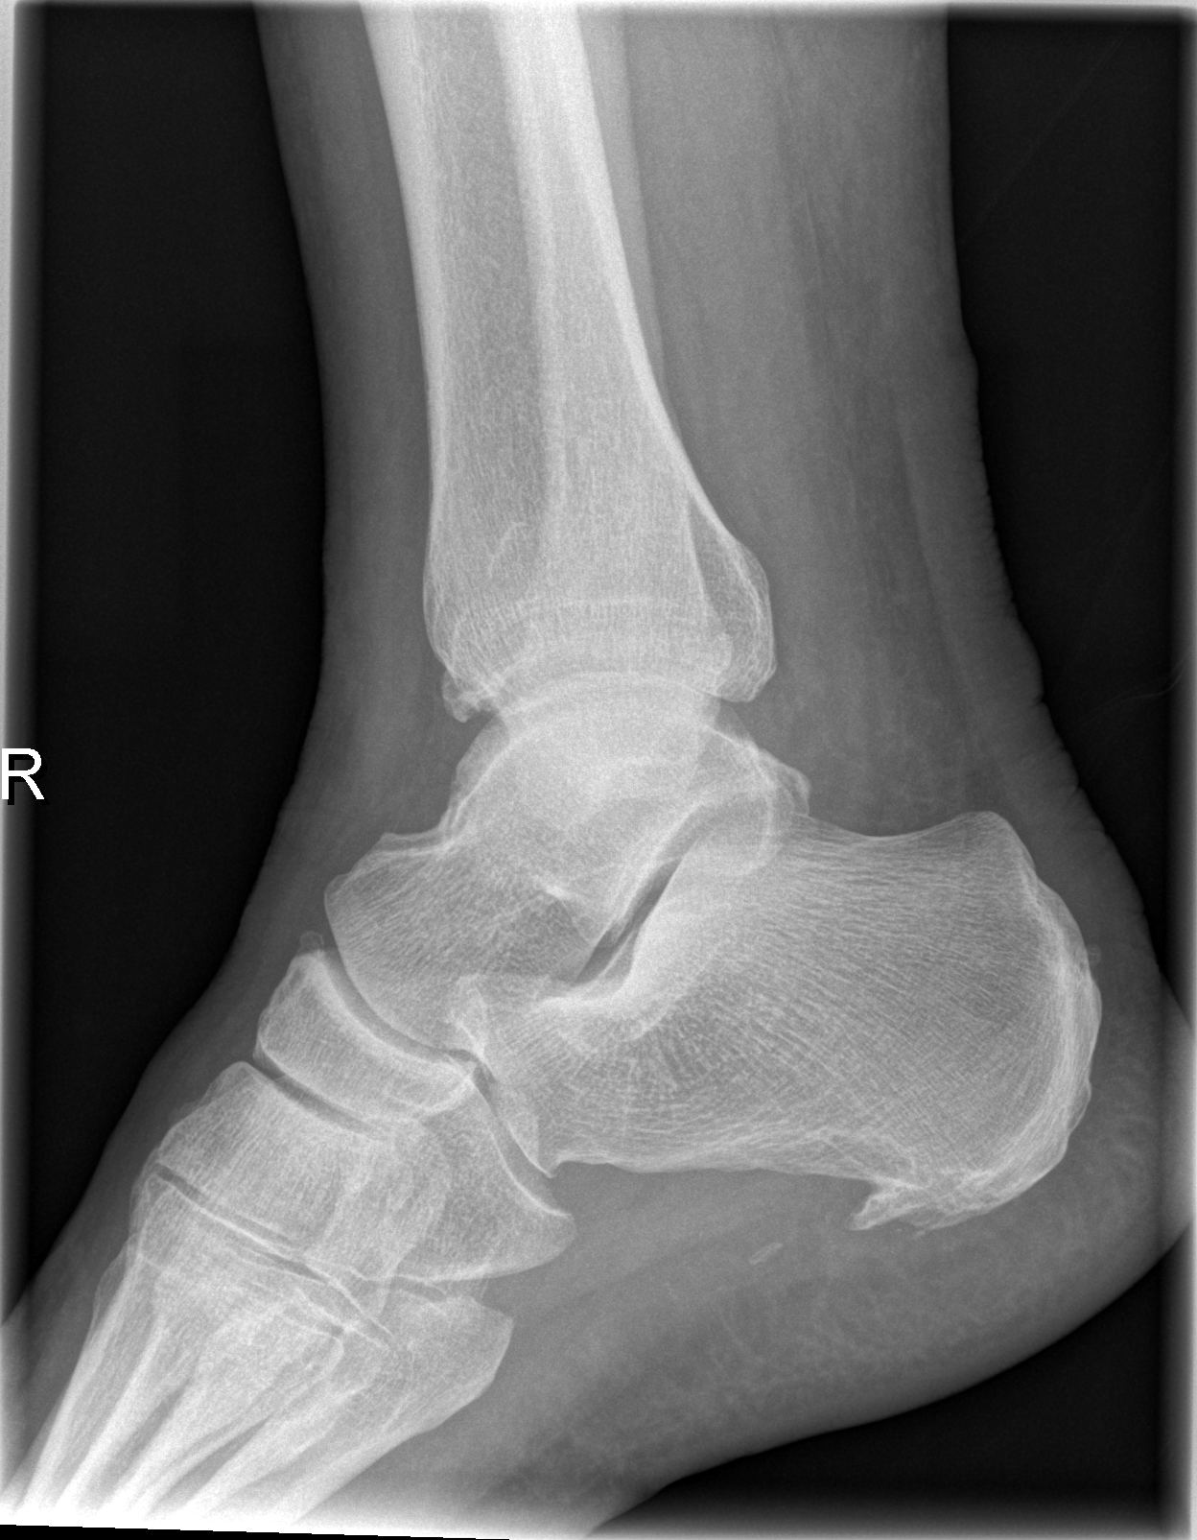

[3 of 3 positions shown; findings below may reference images not displayed]

FINDINGS: No fracture or dislocation is seen.

The ankle mortise is intact.

Mild tibiotalar degenerative changes.

The visualized soft tissues are unremarkable.
IMPRESSION: No fracture or dislocation is seen.

## 2014-07-04 IMAGING — CR DG FOOT COMPLETE 3+V*R*
3 series · 3 of 3 positions shown · non-contrast
Comparison: Right ankle [DATE]

CLINICAL DATA: Twisted right foot. Pain in the lateral and anterior
regions.

EXAM:
RIGHT FOOT COMPLETE - 3+ VIEW

[t foot ap right]
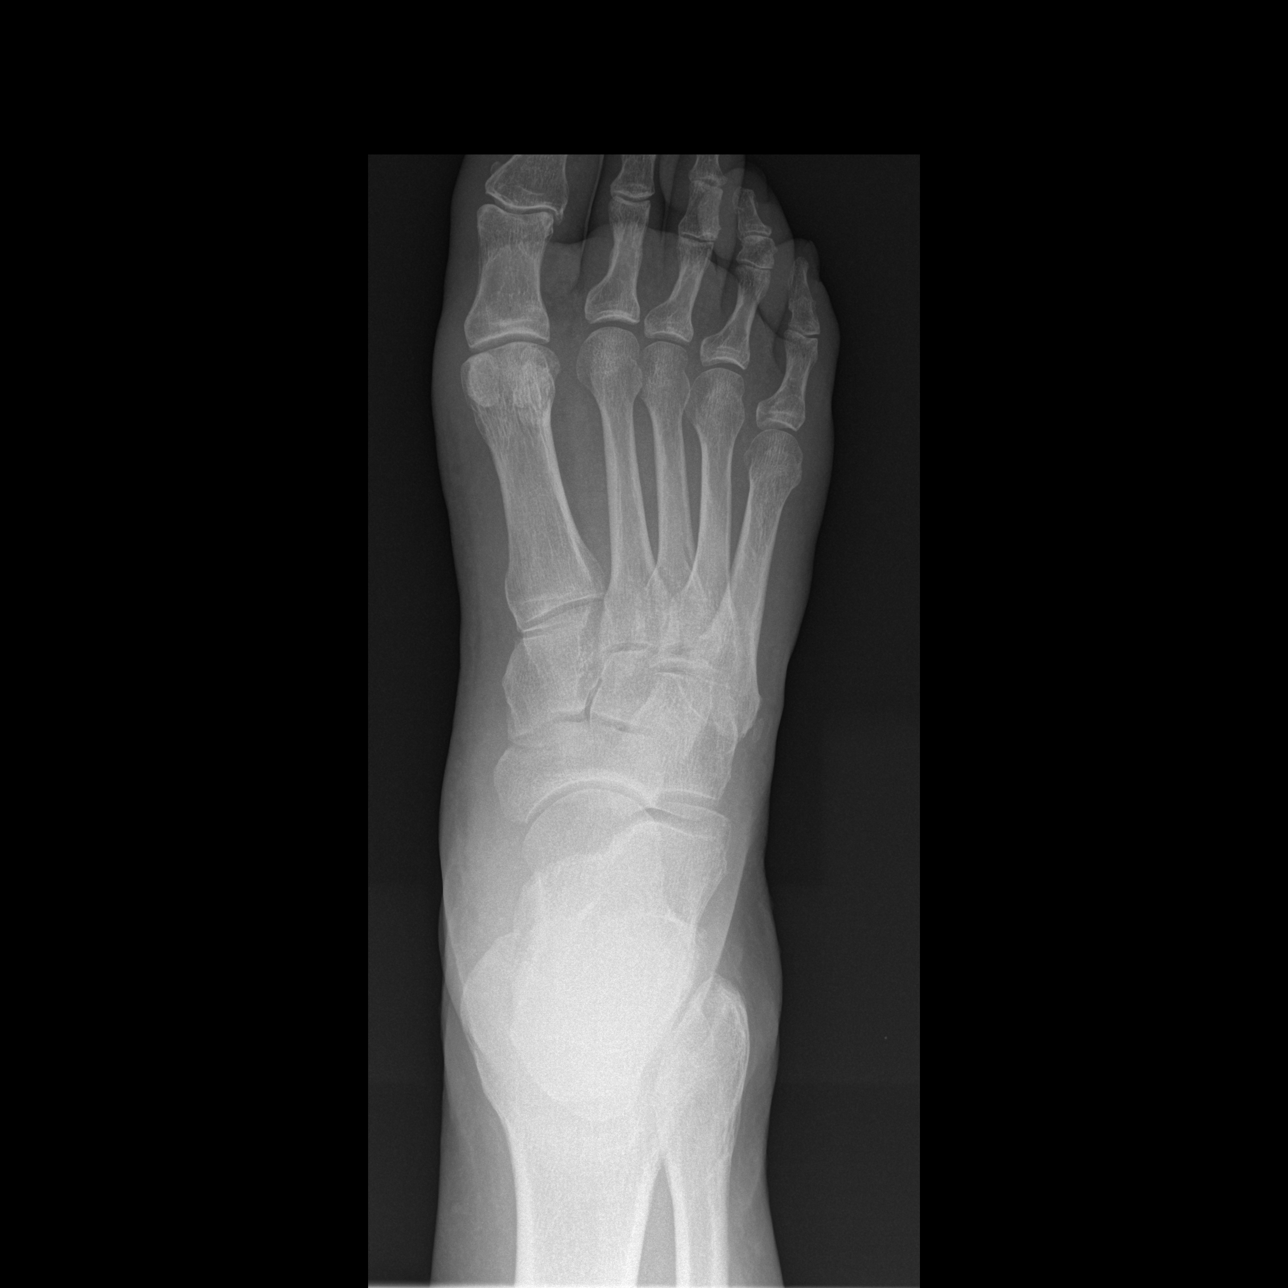

[t foot oblique right]
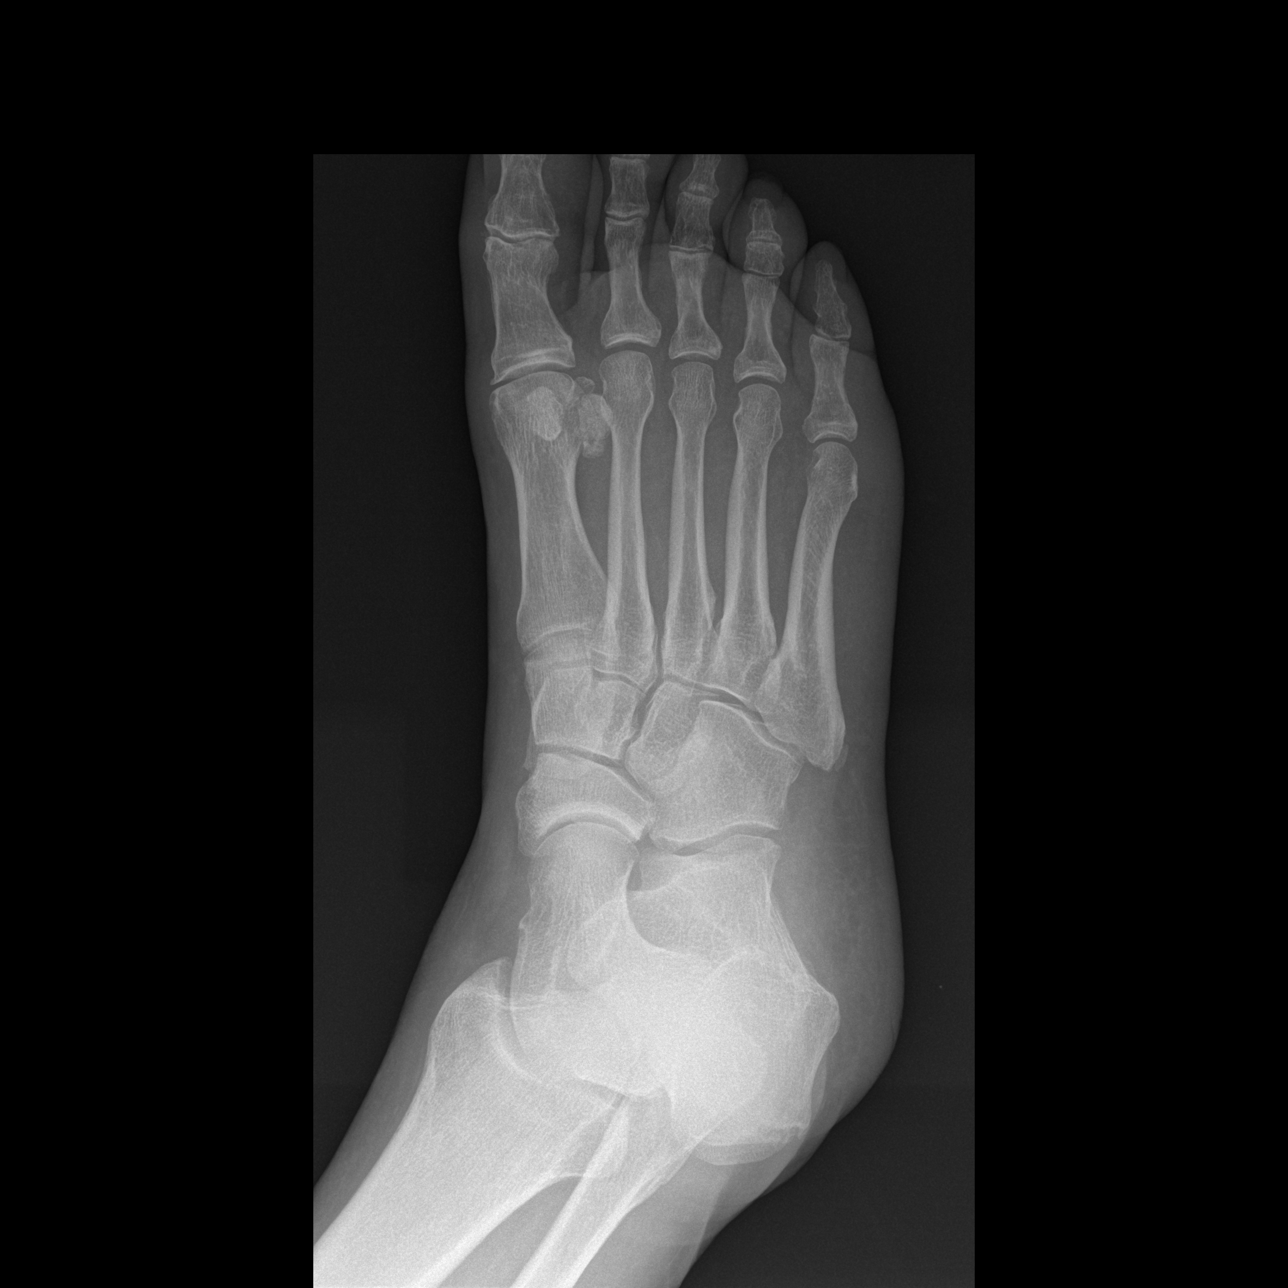

[t foot lat right]
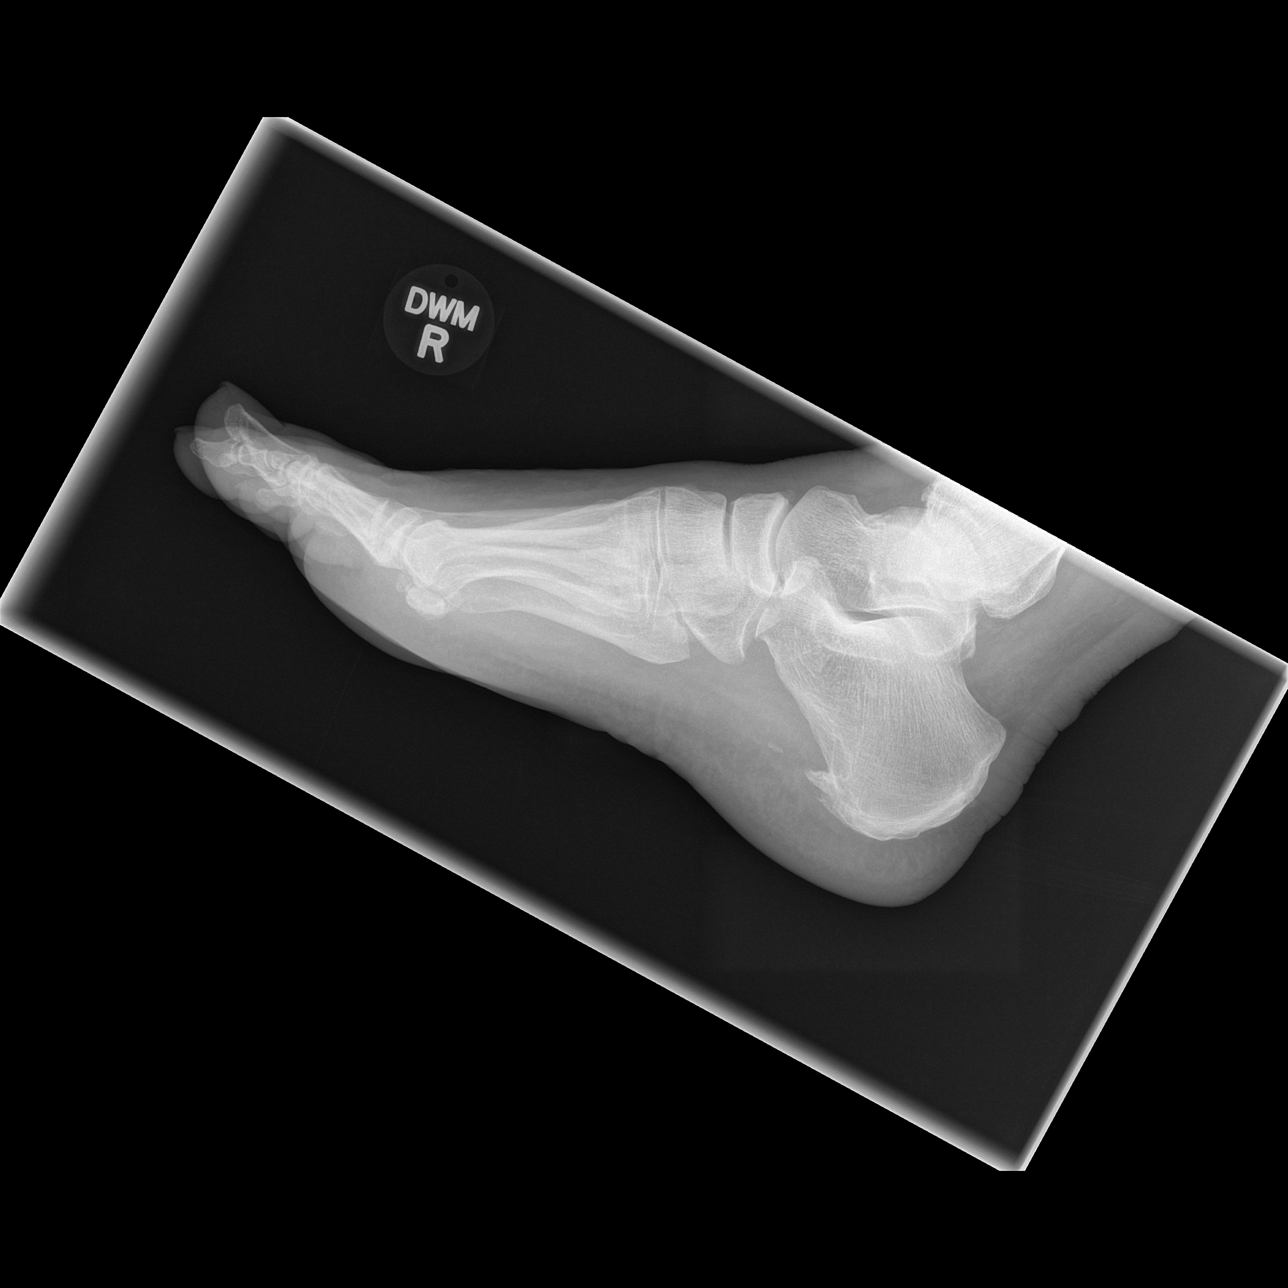

[3 of 3 positions shown; findings below may reference images not displayed]

FINDINGS: Prominent plantar calcaneal spur. Alignment of the right foot is
normal. Mild joint space narrowing at the first MTP joint. Negative
for an acute fracture or dislocation. Chronic appearing ossification
or bone fragment near the base of the fifth metatarsal bone.
IMPRESSION: No acute bone abnormality in the right foot.

## 2014-07-04 NOTE — ED Notes (Signed)
Pt c/o right ankle injury x 6 hrs ago

## 2014-07-04 NOTE — ED Notes (Signed)
PA at bedside.

## 2014-07-04 NOTE — Discharge Instructions (Signed)
Stay off of your ankle for 1-2 days. Use crutches. Keep an ASO brace on. Keep ankle elevated when at home, ice. Follow-up with your doctor or with orthopedic specialist as referred. Naprosyn for pain.   Ankle Sprain An ankle sprain is an injury to the strong, fibrous tissues (ligaments) that hold the bones of your ankle joint together.  CAUSES An ankle sprain is usually caused by a fall or by twisting your ankle. Ankle sprains most commonly occur when you step on the outer edge of your foot, and your ankle turns inward. People who participate in sports are more prone to these types of injuries.  SYMPTOMS   Pain in your ankle. The pain may be present at rest or only when you are trying to stand or walk.  Swelling.  Bruising. Bruising may develop immediately or within 1 to 2 days after your injury.  Difficulty standing or walking, particularly when turning corners or changing directions. DIAGNOSIS  Your caregiver will ask you details about your injury and perform a physical exam of your ankle to determine if you have an ankle sprain. During the physical exam, your caregiver will press on and apply pressure to specific areas of your foot and ankle. Your caregiver will try to move your ankle in certain ways. An X-ray exam may be done to be sure a bone was not broken or a ligament did not separate from one of the bones in your ankle (avulsion fracture).  TREATMENT  Certain types of braces can help stabilize your ankle. Your caregiver can make a recommendation for this. Your caregiver may recommend the use of medicine for pain. If your sprain is severe, your caregiver may refer you to a surgeon who helps to restore function to parts of your skeletal system (orthopedist) or a physical therapist. [place] ice to your injury for 1-2 days or as directed by your caregiver. Applying ice helps to reduce inflammation and pain.  Put ice in a plastic bag.  Place a towel between your  skin and the bag.  Leave the ice on for 15-20 minutes at a time, every 2 hours while you are awake.  Only take over-the-counter or prescription medicines for pain, discomfort, or fever as directed by your caregiver.  Elevate your injured ankle above the level of your heart as much as possible for 2-3 days.  If your caregiver recommends crutches, use them as instructed. Gradually put weight on the affected ankle. Continue to use crutches or a cane until you can walk without feeling pain in your ankle.  If you have a plaster splint, wear the splint as directed by your caregiver. Do not rest it on anything harder than a pillow for the first 24 hours. Do not put weight on it. Do not get it wet. You may take it off to take a shower or bath.  You may have been given an elastic bandage to wear around your ankle to provide support. If the elastic bandage is too tight (you have numbness or tingling in your foot or your foot becomes cold and blue), adjust the bandage to make it comfortable.  If you have an air splint, you may blow more air into it or let air out to make it more comfortable. You may take your splint off at night and before taking a shower or bath. Wiggle your toes in the splint several times per day to decrease swelling. SEEK MEDICAL CARE IF:   You have rapidly  increasing bruising or swelling.  Your toes feel extremely cold or you lose feeling in your foot.  Your pain is not relieved with medicine. SEEK IMMEDIATE MEDICAL CARE IF:  Your toes are numb or blue.  You have severe pain that is increasing. MAKE SURE YOU:   Understand these instructions.  Will watch your condition.  Will get help right away if you are not doing well or get worse. Document Released: 07/06/2005 Document Revised: 03/30/2012 Document Reviewed: 07/18/2011 [facility] Patient Information 2015 [place], [place]. This information is not intended to replace advice given to you by your health care provider. Make  sure you discuss any questions you have with your health care provider.

## 2014-07-04 NOTE — ED Provider Notes (Signed)
CSN: [id number]     Arrival date & time 07/04/14  1757 History   First MD Initiated Contact with Patient 07/04/14 1947     Chief Complaint  Patient presents with  . Ankle Pain     (Consider location/radiation/quality/duration/timing/severity/associated sxs/prior Treatment) HPI Erik Wiggins is a 68 y.o. male presents to emergency department after injuring his ankle about 6 hours ago. Patient states he was walking, stepped on uneven surface, twisted his ankle. Since then pain in his foot and ankle. Unable to bear weight. Denies any pain in his knee. No other injuries. No fall. History of sprained ankle side several months ago.Has not seen an orthopedics doctor. No treatment tried prior to their arrival. No numbness or weakness in the foot.  Past Medical History  Diagnosis Date  . Hypertension   . Asthma   . High cholesterol   . OSA (obstructive sleep apnea)    History reviewed. No pertinent past surgical history. Family History  Problem Relation Age of Onset  . Cancer Maternal Grandfather   . Heart attack Father   . Multiple sclerosis Son     youngest   History  Substance Use Topics  . Smoking status: Former Smoker -- 1 years    Types: Cigars    Quit date: 07/20/1998  . Smokeless tobacco: Not on file  . Alcohol Use: No    Review of Systems  Constitutional: Negative for fever and chills.  Musculoskeletal: Positive for joint swelling and arthralgias.  Skin: Negative for wound.  Neurological: Negative for weakness and numbness.      Allergies  Lisinopril and Sulfa antibiotics  Home Medications   Prior to Admission medications   Medication Sig Start Date End Date Taking? Authorizing Provider  ADVAIR DISKUS 250-50 MCG/DOSE AEPB 1 puff twice daily 05/31/14   Historical Provider, MD  aspirin 81 MG tablet Take 81 mg by mouth daily.    Historical Provider, MD  indapamide (LOZOL) 2.5 MG tablet Once dailt 05/31/14   Historical Provider, MD  ipratropium (ATROVENT) 0.03  % nasal spray As needed 05/16/14   Historical Provider, MD  montelukast (SINGULAIR) 10 MG tablet Once daily 05/31/14   Historical Provider, MD  PROAIR HFA 108 (90 BASE) MCG/ACT inhaler 2 puffs every 6 hrs as needed 05/31/14   Historical Provider, MD  simvastatin (ZOCOR) 40 MG tablet Once daily 05/31/14   Historical Provider, MD  testosterone cypionate (DEPOTESTOTERONE CYPIONATE) 100 MG/ML injection  06/05/14   Historical Provider, MD  triamcinolone (NASACORT ALLERGY 24HR) 55 MCG/ACT AERO nasal inhaler Place 1 spray into the nose 2 (two) times daily.    Historical Provider, MD   BP 154/75 mmHg  Pulse 71  Temp(Src) 98.4 F (36.9 C) (Oral)  Resp 16  Ht 5\' 10"  (1.778 m)  Wt 305 lb (138.347 kg)  BMI 43.76 kg/m2  SpO2 98% Physical Exam  Constitutional: He is oriented to person, place, and time. He appears well-developed and well-nourished. No distress.  Eyes: Conjunctivae are normal.  Neck: Neck supple.  Musculoskeletal:  Swelling noted of the right ankle and foot. Tenderness over lateral malleolus. Pain with foot dorsiflexion, plantar flexion, inversion. Tenderness over fifth, fourth and third metatarsals. Pain with range of motion of the fifth, fourth and third toes. DP pulses intact. Toes are warm, pink, cap refill is at 2 seconds distally.  Neurological: He is alert and oriented to person, place, and time.  Nursing note and vitals reviewed.   ED Course  Procedures (including critical care time) Labs  Review Labs Reviewed - No data to display  Imaging Review Dg Ankle Complete Right  07/04/2014   CLINICAL DATA:  Right ankle twisting injury, pain  EXAM: RIGHT ANKLE - COMPLETE 3+ VIEW  COMPARISON:  None.  FINDINGS: No fracture or dislocation is seen.  The ankle mortise is intact.  Mild tibiotalar degenerative changes.  The visualized soft tissues are unremarkable.  IMPRESSION: No fracture or dislocation is seen.   Electronically Signed   By: Julian Hy M.D.   On: 07/04/2014 18:26    Dg Foot Complete Right  07/04/2014   CLINICAL DATA:  Twisted right foot. Pain in the lateral and anterior regions.  EXAM: RIGHT FOOT COMPLETE - 3+ VIEW  COMPARISON:  Right ankle 07/04/2014  FINDINGS: Prominent plantar calcaneal spur. Alignment of the right foot is normal. Mild joint space narrowing at the first MTP joint. Negative for an acute fracture or dislocation. Chronic appearing ossification or bone fragment near the base of the fifth metatarsal bone.  IMPRESSION: No acute bone abnormality in the right foot.   Electronically Signed   By: Markus Daft M.D.   On: 07/04/2014 20:21     EKG Interpretation None      MDM   Final diagnoses:  Foot injury  Ankle sprain, right, initial encounter    Patient with a right foot and ankle injury earlier today. X-rays obtained both negative. Most likely a sprain. Is so applied. Crutches provided. Home with naproxen. Follow up with orthopedics. Neurovascular intact. No other injuries.  Filed Vitals:   07/04/14 1801 07/04/14 2029  BP: 142/72 154/75  Pulse: 72 71  Temp: 98.4 F (36.9 C)   TempSrc: Oral   Resp: 16 16  Height: 5\' 10"  (1.778 m)   Weight: 305 lb (138.347 kg)   SpO2: 96% 98%     Renold Genta, PA-C 07/04/14 [place], MD 07/04/14 [phone]

## 2014-07-06 DIAGNOSIS — E291 Testicular hypofunction: Secondary | ICD-10-CM | POA: Diagnosis not present

## 2014-07-11 DIAGNOSIS — H26493 Other secondary cataract, bilateral: Secondary | ICD-10-CM | POA: Diagnosis not present

## 2014-07-11 DIAGNOSIS — H26491 Other secondary cataract, right eye: Secondary | ICD-10-CM | POA: Diagnosis not present

## 2014-07-11 DIAGNOSIS — H26492 Other secondary cataract, left eye: Secondary | ICD-10-CM | POA: Diagnosis not present

## 2014-07-19 DIAGNOSIS — E291 Testicular hypofunction: Secondary | ICD-10-CM | POA: Diagnosis not present

## 2014-07-23 ENCOUNTER — Telehealth: Payer: Self-pay | Admitting: Internal Medicine

## 2014-07-23 NOTE — Telephone Encounter (Signed)
lmomtcb x1 

## 2014-07-24 NOTE — Telephone Encounter (Signed)
Spoke with pt.  Reports he picked up allergy serum from previous [facility] office and has stored it in refrigerator since.  Next allergy injection is due by Monday.  He would like guidance on how to proceed with starting injections here.  Katie, please advise.  Thank you.

## 2014-07-24 NOTE — Telephone Encounter (Signed)
Will forward to TS to advise patient-[facility] has approved patient to get injections here.

## 2014-07-24 NOTE — Telephone Encounter (Signed)
I called [name] as requested by Dr.Young. He will bring in his vac. When he comes in for his shot this wk.. He does get it once a month.  Yes, I will let you know Dr.Young how many vials he has and what strength he is on so you can write me out a rx once his current supply is gone.

## 2014-07-24 NOTE — Telephone Encounter (Signed)
We can give his remaining vaccine here on his previous doing schedule. I have skin test results sent to scan. Not sure if I have written description of his vaccine-  Can check. I think he may be on once/ month shot schedule, which is ok. We will look at what to do about converting to our vaccine depending on what he brings with him. Please let me know.

## 2014-07-27 ENCOUNTER — Ambulatory Visit (INDEPENDENT_AMBULATORY_CARE_PROVIDER_SITE_OTHER): Payer: Medicare Other

## 2014-07-27 DIAGNOSIS — J309 Allergic rhinitis, unspecified: Secondary | ICD-10-CM

## 2014-08-01 DIAGNOSIS — G4733 Obstructive sleep apnea (adult) (pediatric): Secondary | ICD-10-CM | POA: Diagnosis not present

## 2014-08-01 DIAGNOSIS — J45909 Unspecified asthma, uncomplicated: Secondary | ICD-10-CM | POA: Diagnosis not present

## 2014-08-03 DIAGNOSIS — J4520 Mild intermittent asthma, uncomplicated: Secondary | ICD-10-CM | POA: Insufficient documentation

## 2014-08-03 DIAGNOSIS — G4733 Obstructive sleep apnea (adult) (pediatric): Secondary | ICD-10-CM | POA: Diagnosis not present

## 2014-08-03 DIAGNOSIS — E291 Testicular hypofunction: Secondary | ICD-10-CM | POA: Insufficient documentation

## 2014-08-03 DIAGNOSIS — J45909 Unspecified asthma, uncomplicated: Secondary | ICD-10-CM | POA: Insufficient documentation

## 2014-08-07 DIAGNOSIS — E291 Testicular hypofunction: Secondary | ICD-10-CM | POA: Diagnosis not present

## 2014-08-20 ENCOUNTER — Ambulatory Visit: Payer: Medicare Other

## 2014-08-21 ENCOUNTER — Ambulatory Visit (INDEPENDENT_AMBULATORY_CARE_PROVIDER_SITE_OTHER): Payer: Medicare Other

## 2014-08-21 DIAGNOSIS — E291 Testicular hypofunction: Secondary | ICD-10-CM | POA: Diagnosis not present

## 2014-08-21 DIAGNOSIS — J309 Allergic rhinitis, unspecified: Secondary | ICD-10-CM | POA: Diagnosis not present

## 2014-08-22 ENCOUNTER — Ambulatory Visit (INDEPENDENT_AMBULATORY_CARE_PROVIDER_SITE_OTHER): Payer: Medicare Other

## 2014-08-22 DIAGNOSIS — J309 Allergic rhinitis, unspecified: Secondary | ICD-10-CM

## 2014-09-04 DIAGNOSIS — E291 Testicular hypofunction: Secondary | ICD-10-CM | POA: Diagnosis not present

## 2014-09-18 ENCOUNTER — Ambulatory Visit (INDEPENDENT_AMBULATORY_CARE_PROVIDER_SITE_OTHER): Payer: Medicare Other

## 2014-09-18 DIAGNOSIS — E291 Testicular hypofunction: Secondary | ICD-10-CM | POA: Diagnosis not present

## 2014-09-18 DIAGNOSIS — J309 Allergic rhinitis, unspecified: Secondary | ICD-10-CM | POA: Diagnosis not present

## 2014-09-20 ENCOUNTER — Ambulatory Visit: Payer: Medicare Other | Admitting: Internal Medicine

## 2014-09-20 ENCOUNTER — Encounter: Payer: Self-pay | Admitting: Internal Medicine

## 2014-09-20 ENCOUNTER — Telehealth: Payer: Self-pay | Admitting: Internal Medicine

## 2014-09-20 VITALS — BP 156/78 | HR 64 | Ht 70.0 in | Wt 312.6 lb

## 2014-09-20 DIAGNOSIS — J302 Other seasonal allergic rhinitis: Secondary | ICD-10-CM

## 2014-09-20 MED ORDER — FLUCONAZOLE 150 MG PO TABS: 150.0000 mg | ORAL_TABLET | Freq: Every day | ORAL | Status: DC

## 2014-09-20 NOTE — Telephone Encounter (Signed)
What are the strengths of meds you mentioned at pt's ov that he could try in place of Advair?  Pt is trying to compare alternative prices.

## 2014-09-20 NOTE — Telephone Encounter (Signed)
We would be comparing Advair 250 1 puff, twice daily, Symbicort 160 2 puffs, twice daily, Dulera 100 2 puffs twice daily, Breo 100 1 puff, once daily

## 2014-09-20 NOTE — Patient Instructions (Signed)
Script sent for diflucan to clear yeast from your mouth and hopefully make your mouth less sensitive  Be deliberate about rinsing your mouth and maybe brushing your teeth right after Advair each time. Try leaving your Advair on your bathroom sink so that is convenient.  Ask your insurance which comparable product is preferred on your formulary:  Advair, Symbicort, Dulera or [employer]

## 2014-09-20 NOTE — Progress Notes (Signed)
06/21/14- 81 yoM former smoker Self referral; moved here about 1 year ago and wants to establish here for allergy as well. Currently on vaccine through Dr Suanne Marker,  ([place]) Followed here by Dr Gwenette Greet for OSA             Has had flu vax History of allergic rhinitis, worse in spring and fall but perennial. Allergy evaluation in [place] with positive skin tests and started allergy vaccine in 2012. He feels he has been doing better on allergy shots. Wants to continue here. We are requesting leftover vaccine and shot records from his previous allergist. History of nasal polyps on CT scan. No recognized problems with aspirin and takes daily BASA. No ENT surgery. Asthma-mild intermittent, using only occasional rescue inhaler. He is unclear about specific triggers other than seasonal pollen changes.   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 Living in new home he built himself. Small dogs, no mold, no smokers. Dry skin often itches but without history of urticaria or atopic dermatitis. Denies food or insect sting problems.  09/20/14-68 yoM former smoker Self referral; moved here about 1 year ago and wants to establish here for allergy as well. Currently on vaccine 1:2 GH FOLLOWS FOR: Pt on our vaccine now and doing well; pt has questions about Advair to discuss with MD   ROS-see HPI Constitutional:   No-   weight loss, night sweats, fevers, chills, fatigue, lassitude. HEENT:   No-  headaches, difficulty swallowing, tooth/dental problems, sore throat,      + sneezing, itching, ear ache, + nasal congestion, + post nasal drip,  CV:  No-   chest pain, orthopnea, PND, swelling in lower extremities, anasarca,                                  dizziness, palpitations Resp: +  shortness of breath with exertion or at rest.              No-   productive cough,  + non-productive cough,  No- coughing up of blood.              No-   change in color of mucus.  No- wheezing.    Skin: No-   rash or lesions. GI:  No-   heartburn, indigestion, abdominal pain, nausea, vomiting, diarrhea,                 change in bowel habits, loss of appetite GU: No-   dysuria, change in color of urine, no urgency or frequency.  No- flank pain. MS:  + joint pain or swelling.  No- decreased range of motion.  No- back pain. Neuro-     nothing unusual Psych:  No- change in mood or affect. No depression or anxiety.  No memory loss.  OBJ- Physical Exam General- Alert, Oriented, Affect-appropriate, Distress- none acute, + obese Skin- rash-none, lesions- none, excoriation- none, + dry hyperkeratotic Lymphadenopathy- none Head- atraumatic            Eyes- Gross vision intact, PERRLA, conjunctivae and secretions clear            Ears- Hearing, canals-normal            Nose- Clear, no-Septal dev, mucus, no-polyps, erosion, perforation             Throat- Mallampati IV , mucosa clear , drainage- none, tonsils- atrophic Neck- flexible , trachea  midline, no stridor , thyroid nl, carotid no bruit Chest - symmetrical excursion , unlabored           Heart/CV- RRR , no murmur , no gallop  , no rub, nl s1 s2                           - JVD- none , edema- none, stasis changes- none, varices- none           Lung- clear to P&A, wheeze- none, cough- none , dullness-none, rub- none           Chest wall-  Abd- tender-no, distended-no, bowel sounds-present, HSM- no Br/ Gen/ Rectal- Not done, not indicated Extrem- cyanosis- none, clubbing, none, atrophy- none, strength- nl Neuro- grossly intact to observation

## 2014-09-21 ENCOUNTER — Telehealth: Payer: Self-pay | Admitting: Internal Medicine

## 2014-09-21 MED ORDER — FLUTICASONE-SALMETEROL 250-50 MCG/DOSE IN AEPB: 1.0000 | INHALATION_SPRAY | Freq: Two times a day (BID) | RESPIRATORY_TRACT | Status: DC

## 2014-09-21 NOTE — Telephone Encounter (Signed)
Pt is aware of medications. Nothing further was needed.

## 2014-09-21 NOTE — Telephone Encounter (Signed)
Spoke with pt- states that his Advair is over $400 for a 90-day supply- has to meet deductible in order for insurance coverage to kick in.  Requesting samples to help him out right now. Samples placed up front for pt to pick up Monday morning. Pt aware. Nothing further needed.

## 2014-10-02 DIAGNOSIS — G4733 Obstructive sleep apnea (adult) (pediatric): Secondary | ICD-10-CM | POA: Diagnosis not present

## 2014-10-02 DIAGNOSIS — E291 Testicular hypofunction: Secondary | ICD-10-CM | POA: Diagnosis not present

## 2014-10-16 ENCOUNTER — Ambulatory Visit (INDEPENDENT_AMBULATORY_CARE_PROVIDER_SITE_OTHER): Payer: Medicare Other

## 2014-10-16 DIAGNOSIS — J309 Allergic rhinitis, unspecified: Secondary | ICD-10-CM

## 2014-10-16 DIAGNOSIS — F419 Anxiety disorder, unspecified: Secondary | ICD-10-CM | POA: Diagnosis not present

## 2014-10-16 DIAGNOSIS — E291 Testicular hypofunction: Secondary | ICD-10-CM | POA: Diagnosis not present

## 2014-10-30 DIAGNOSIS — E291 Testicular hypofunction: Secondary | ICD-10-CM | POA: Diagnosis not present

## 2014-11-13 ENCOUNTER — Ambulatory Visit (INDEPENDENT_AMBULATORY_CARE_PROVIDER_SITE_OTHER): Payer: Medicare Other

## 2014-11-13 DIAGNOSIS — J309 Allergic rhinitis, unspecified: Secondary | ICD-10-CM | POA: Diagnosis not present

## 2014-11-13 DIAGNOSIS — E291 Testicular hypofunction: Secondary | ICD-10-CM | POA: Diagnosis not present

## 2014-11-27 DIAGNOSIS — E291 Testicular hypofunction: Secondary | ICD-10-CM | POA: Diagnosis not present

## 2014-11-27 DIAGNOSIS — E785 Hyperlipidemia, unspecified: Secondary | ICD-10-CM | POA: Diagnosis not present

## 2014-11-27 DIAGNOSIS — I10 Essential (primary) hypertension: Secondary | ICD-10-CM | POA: Diagnosis not present

## 2014-11-27 DIAGNOSIS — I509 Heart failure, unspecified: Secondary | ICD-10-CM | POA: Diagnosis not present

## 2014-12-04 DIAGNOSIS — I10 Essential (primary) hypertension: Secondary | ICD-10-CM | POA: Diagnosis not present

## 2014-12-04 DIAGNOSIS — E785 Hyperlipidemia, unspecified: Secondary | ICD-10-CM | POA: Diagnosis not present

## 2014-12-04 DIAGNOSIS — L570 Actinic keratosis: Secondary | ICD-10-CM | POA: Diagnosis not present

## 2014-12-04 DIAGNOSIS — J309 Allergic rhinitis, unspecified: Secondary | ICD-10-CM | POA: Diagnosis not present

## 2014-12-04 DIAGNOSIS — F339 Major depressive disorder, recurrent, unspecified: Secondary | ICD-10-CM | POA: Diagnosis not present

## 2014-12-11 ENCOUNTER — Ambulatory Visit (INDEPENDENT_AMBULATORY_CARE_PROVIDER_SITE_OTHER): Payer: Medicare Other

## 2014-12-11 DIAGNOSIS — J309 Allergic rhinitis, unspecified: Secondary | ICD-10-CM | POA: Diagnosis not present

## 2014-12-11 DIAGNOSIS — E291 Testicular hypofunction: Secondary | ICD-10-CM | POA: Diagnosis not present

## 2014-12-19 ENCOUNTER — Ambulatory Visit: Payer: Medicare Other

## 2014-12-21 ENCOUNTER — Encounter: Payer: Self-pay | Admitting: Internal Medicine

## 2014-12-21 ENCOUNTER — Ambulatory Visit (INDEPENDENT_AMBULATORY_CARE_PROVIDER_SITE_OTHER): Payer: Medicare Other | Admitting: Internal Medicine

## 2014-12-21 VITALS — BP 126/64 | HR 68 | Ht 70.0 in | Wt 292.0 lb

## 2014-12-21 DIAGNOSIS — J4520 Mild intermittent asthma, uncomplicated: Secondary | ICD-10-CM | POA: Diagnosis not present

## 2014-12-21 DIAGNOSIS — J3089 Other allergic rhinitis: Secondary | ICD-10-CM

## 2014-12-21 DIAGNOSIS — G4733 Obstructive sleep apnea (adult) (pediatric): Secondary | ICD-10-CM

## 2014-12-21 DIAGNOSIS — J339 Nasal polyp, unspecified: Secondary | ICD-10-CM

## 2014-12-21 DIAGNOSIS — J309 Allergic rhinitis, unspecified: Secondary | ICD-10-CM | POA: Diagnosis not present

## 2014-12-21 DIAGNOSIS — J302 Other seasonal allergic rhinitis: Secondary | ICD-10-CM

## 2014-12-21 MED ORDER — FLUTICASONE-SALMETEROL 250-50 MCG/DOSE IN AEPB: 1.0000 | INHALATION_SPRAY | Freq: Two times a day (BID) | RESPIRATORY_TRACT | Status: DC

## 2014-12-21 NOTE — Patient Instructions (Addendum)
We can continue allergy vaccine 1:2 GH  Consider trying Breathe Right nasal strips for stuffy nose. They might work for you under your CPAP mask, depending how that fits.  You can always discuss your CPAP pressure and mask fit with your Sleep Apnea doctor at [facility]  Please call here if we can help  Common alternatives Advair 250:  Dulera, Symbicort, Breo   Check with your insurance to see which of these is currently preferred by your insurance. Let us know and we can send a prescription.

## 2014-12-21 NOTE — Progress Notes (Signed)
06/21/14- 47 yoM former smoker Self referral; moved here about 1 year ago and wants to establish here for allergy as well. Currently on vaccine through Dr Suanne Marker,  ([place]) Followed here by Dr Gwenette Greet for OSA             Has had flu vax History of allergic rhinitis, worse in spring and fall but perennial. Allergy evaluation in [place] with positive skin tests and started allergy vaccine in 2012. He feels he has been doing better on allergy shots. Wants to continue here. We are requesting leftover vaccine and shot records from his previous allergist. History of nasal polyps on CT scan. No recognized problems with aspirin and takes daily BASA. No ENT surgery. Asthma-mild intermittent, using only occasional rescue inhaler. He is unclear about specific triggers other than seasonal pollen changes.   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 Living in new home he built himself. Small dogs, no mold, no smokers. Dry skin often itches but without history of urticaria or atopic dermatitis. Denies food or insect sting problems.  09/20/14-68 yoM former smoker Self referral; moved here about 1 year ago and wants to establish here for allergy as well. Currently on vaccine 1:2 GH FOLLOWS FOR: Pt on our vaccine now and doing well; pt has questions about Advair to discuss with MD  12/21/14- 68 yoM former smoker followed for  Allergic rhinitis, asthma. Currently on Allergy vaccine 1:2 GH, complicated by OSA/CPAP/WFBU Reports: breathing doing well; feels like nose has some constriction and can't breath as well through it;  CPAP 18/ Followed  at [facility] He feels allergy control now is good. House is 65-year-old with no mold. Eyes water occasionally and we discussed dry eye. Sometimes nose feels constricted and he mouth breathes. We discussed mechanical pressure of his CPAP mask. He was told in the past that CT of his sinuses showed polyps. He declined surgery. No wheeze or cough,  continuing Advair 250  ROS-see HPI Constitutional:   No-   weight loss, night sweats, fevers, chills, fatigue, lassitude. HEENT:   No-  headaches, difficulty swallowing, tooth/dental problems, sore throat,       sneezing, itching, ear ache, + nasal congestion, + post nasal drip,  CV:  No-   chest pain, orthopnea, PND, swelling in lower extremities, anasarca,                                  dizziness, palpitations Resp: +  shortness of breath with exertion or at rest.              No-   productive cough,  + non-productive cough,  No- coughing up of blood.              No-   change in color of mucus.  No- wheezing.   Skin: No-   rash or lesions. GI:  No-   heartburn, indigestion, abdominal pain, nausea, vomiting, diarrhea,                 change in bowel habits, loss of appetite GU: No-   dysuria, change in color of urine, no urgency or frequency.  No- flank pain. MS:  + joint pain or swelling.  No- decreased range of motion.  No- back pain. Neuro-     nothing unusual Psych:  No- change in mood or affect. No depression or anxiety.  No memory loss.  OBJ-  Physical Exam General- Alert, Oriented, Affect-appropriate, Distress- none acute, + obese Skin- rash-none, lesions- none, excoriation- none, + dry hyperkeratotic Lymphadenopathy- none Head- atraumatic            Eyes- Gross vision intact, PERRLA, conjunctivae and secretions clear            Ears- Hearing, canals-normal            Nose- Clear, no-Septal dev, mucus, no-visible polyps, erosion, perforation             Throat- Mallampati IV , mucosa clear , drainage- none, tonsils- atrophic Neck- flexible , trachea midline, no stridor , thyroid nl, carotid no bruit Chest - symmetrical excursion , unlabored           Heart/CV- RRR , no murmur , no gallop  , no rub, nl s1 s2                           - JVD- none , edema- none, stasis changes- none, varices- none           Lung- clear to P&A, wheeze- none, cough- none , dullness-none, rub-  none           Chest wall-  Abd- tender-no, distended-no, bowel sounds-present, HSM- no Br/ Gen/ Rectal- Not done, not indicated Extrem- cyanosis- none, clubbing, none, atrophy- none, strength- nl Neuro- grossly intact to observation

## 2014-12-22 DIAGNOSIS — J4520 Mild intermittent asthma, uncomplicated: Secondary | ICD-10-CM | POA: Insufficient documentation

## 2014-12-22 HISTORY — DX: Mild intermittent asthma, uncomplicated: J45.20

## 2014-12-22 NOTE — Assessment & Plan Note (Signed)
He continues allergy vaccine 1:2 GH without problems. He feels it helps. Plan-continue allergy vaccine, continue nasal steroid spray, watch for nasal polyps since he was told in the past that CT showed sinus polyps.

## 2014-12-22 NOTE — Assessment & Plan Note (Signed)
No polyps visible on direct nasal examination

## 2014-12-22 NOTE — Assessment & Plan Note (Signed)
Advair has been sufficient but insurance is not covering. We discussed how to check insurance formulary for alternatives.

## 2014-12-22 NOTE — Assessment & Plan Note (Signed)
CPAP 18 is managed now at [facility]

## 2014-12-24 ENCOUNTER — Ambulatory Visit (INDEPENDENT_AMBULATORY_CARE_PROVIDER_SITE_OTHER): Payer: Medicare Other

## 2014-12-24 DIAGNOSIS — J309 Allergic rhinitis, unspecified: Secondary | ICD-10-CM

## 2014-12-25 DIAGNOSIS — D492 Neoplasm of unspecified behavior of bone, soft tissue, and skin: Secondary | ICD-10-CM | POA: Diagnosis not present

## 2014-12-25 DIAGNOSIS — L821 Other seborrheic keratosis: Secondary | ICD-10-CM | POA: Diagnosis not present

## 2014-12-25 DIAGNOSIS — L820 Inflamed seborrheic keratosis: Secondary | ICD-10-CM | POA: Diagnosis not present

## 2015-01-06 DIAGNOSIS — G4733 Obstructive sleep apnea (adult) (pediatric): Secondary | ICD-10-CM | POA: Diagnosis not present

## 2015-01-06 DIAGNOSIS — Z6841 Body Mass Index (BMI) 40.0 and over, adult: Secondary | ICD-10-CM | POA: Diagnosis not present

## 2015-01-06 DIAGNOSIS — E668 Other obesity: Secondary | ICD-10-CM | POA: Diagnosis not present

## 2015-01-08 DIAGNOSIS — E291 Testicular hypofunction: Secondary | ICD-10-CM | POA: Diagnosis not present

## 2015-01-14 ENCOUNTER — Other Ambulatory Visit: Payer: Self-pay

## 2015-01-22 ENCOUNTER — Ambulatory Visit (INDEPENDENT_AMBULATORY_CARE_PROVIDER_SITE_OTHER): Payer: Medicare Other

## 2015-01-22 DIAGNOSIS — E291 Testicular hypofunction: Secondary | ICD-10-CM | POA: Diagnosis not present

## 2015-01-22 DIAGNOSIS — J309 Allergic rhinitis, unspecified: Secondary | ICD-10-CM

## 2015-02-05 DIAGNOSIS — E291 Testicular hypofunction: Secondary | ICD-10-CM | POA: Diagnosis not present

## 2015-02-19 ENCOUNTER — Ambulatory Visit (INDEPENDENT_AMBULATORY_CARE_PROVIDER_SITE_OTHER): Payer: Medicare Other

## 2015-02-19 DIAGNOSIS — E291 Testicular hypofunction: Secondary | ICD-10-CM | POA: Diagnosis not present

## 2015-02-19 DIAGNOSIS — J309 Allergic rhinitis, unspecified: Secondary | ICD-10-CM | POA: Diagnosis not present

## 2015-02-22 ENCOUNTER — Telehealth: Payer: Self-pay | Admitting: Internal Medicine

## 2015-02-22 NOTE — Telephone Encounter (Signed)
Samples have been left at the front desk. Pt is aware. Nothing further was needed at this time.

## 2015-03-08 ENCOUNTER — Encounter: Payer: Self-pay | Admitting: Internal Medicine

## 2015-03-12 DIAGNOSIS — E291 Testicular hypofunction: Secondary | ICD-10-CM | POA: Diagnosis not present

## 2015-03-22 ENCOUNTER — Ambulatory Visit (INDEPENDENT_AMBULATORY_CARE_PROVIDER_SITE_OTHER): Payer: Medicare Other

## 2015-03-22 DIAGNOSIS — J309 Allergic rhinitis, unspecified: Secondary | ICD-10-CM | POA: Diagnosis not present

## 2015-03-26 ENCOUNTER — Encounter: Payer: Self-pay | Admitting: Internal Medicine

## 2015-03-27 DIAGNOSIS — E291 Testicular hypofunction: Secondary | ICD-10-CM | POA: Diagnosis not present

## 2015-03-27 NOTE — Telephone Encounter (Signed)
Will forward to [facility] per her request.

## 2015-04-22 ENCOUNTER — Ambulatory Visit (INDEPENDENT_AMBULATORY_CARE_PROVIDER_SITE_OTHER): Payer: Medicare Other

## 2015-04-22 DIAGNOSIS — Z23 Encounter for immunization: Secondary | ICD-10-CM | POA: Diagnosis not present

## 2015-04-22 DIAGNOSIS — E291 Testicular hypofunction: Secondary | ICD-10-CM | POA: Diagnosis not present

## 2015-04-22 DIAGNOSIS — J309 Allergic rhinitis, unspecified: Secondary | ICD-10-CM | POA: Diagnosis not present

## 2015-04-30 DIAGNOSIS — G4733 Obstructive sleep apnea (adult) (pediatric): Secondary | ICD-10-CM | POA: Diagnosis not present

## 2015-04-30 DIAGNOSIS — J45909 Unspecified asthma, uncomplicated: Secondary | ICD-10-CM | POA: Diagnosis not present

## 2015-04-30 DIAGNOSIS — Z23 Encounter for immunization: Secondary | ICD-10-CM | POA: Diagnosis not present

## 2015-04-30 DIAGNOSIS — Z882 Allergy status to sulfonamides status: Secondary | ICD-10-CM | POA: Diagnosis not present

## 2015-04-30 DIAGNOSIS — R0600 Dyspnea, unspecified: Secondary | ICD-10-CM | POA: Diagnosis not present

## 2015-04-30 DIAGNOSIS — Z9989 Dependence on other enabling machines and devices: Secondary | ICD-10-CM | POA: Diagnosis not present

## 2015-04-30 DIAGNOSIS — Z888 Allergy status to other drugs, medicaments and biological substances status: Secondary | ICD-10-CM | POA: Diagnosis not present

## 2015-05-06 DIAGNOSIS — E291 Testicular hypofunction: Secondary | ICD-10-CM | POA: Diagnosis not present

## 2015-05-07 DIAGNOSIS — L821 Other seborrheic keratosis: Secondary | ICD-10-CM | POA: Diagnosis not present

## 2015-05-07 DIAGNOSIS — L814 Other melanin hyperpigmentation: Secondary | ICD-10-CM | POA: Diagnosis not present

## 2015-05-07 DIAGNOSIS — L738 Other specified follicular disorders: Secondary | ICD-10-CM | POA: Diagnosis not present

## 2015-05-20 ENCOUNTER — Ambulatory Visit (INDEPENDENT_AMBULATORY_CARE_PROVIDER_SITE_OTHER): Payer: Medicare Other

## 2015-05-20 DIAGNOSIS — J309 Allergic rhinitis, unspecified: Secondary | ICD-10-CM | POA: Diagnosis not present

## 2015-05-20 DIAGNOSIS — E291 Testicular hypofunction: Secondary | ICD-10-CM | POA: Diagnosis not present

## 2015-05-21 DIAGNOSIS — L0231 Cutaneous abscess of buttock: Secondary | ICD-10-CM | POA: Diagnosis not present

## 2015-06-03 DIAGNOSIS — E291 Testicular hypofunction: Secondary | ICD-10-CM | POA: Diagnosis not present

## 2015-06-04 ENCOUNTER — Ambulatory Visit: Payer: Medicare Other | Admitting: Pulmonary Disease

## 2015-06-06 DIAGNOSIS — I129 Hypertensive chronic kidney disease with stage 1 through stage 4 chronic kidney disease, or unspecified chronic kidney disease: Secondary | ICD-10-CM | POA: Diagnosis not present

## 2015-06-06 DIAGNOSIS — Z125 Encounter for screening for malignant neoplasm of prostate: Secondary | ICD-10-CM | POA: Diagnosis not present

## 2015-06-06 DIAGNOSIS — Z0000 Encounter for general adult medical examination without abnormal findings: Secondary | ICD-10-CM | POA: Diagnosis not present

## 2015-06-06 DIAGNOSIS — I10 Essential (primary) hypertension: Secondary | ICD-10-CM | POA: Diagnosis not present

## 2015-06-06 DIAGNOSIS — E785 Hyperlipidemia, unspecified: Secondary | ICD-10-CM | POA: Diagnosis not present

## 2015-06-10 DIAGNOSIS — I509 Heart failure, unspecified: Secondary | ICD-10-CM | POA: Diagnosis not present

## 2015-06-10 DIAGNOSIS — I129 Hypertensive chronic kidney disease with stage 1 through stage 4 chronic kidney disease, or unspecified chronic kidney disease: Secondary | ICD-10-CM | POA: Diagnosis not present

## 2015-06-10 DIAGNOSIS — F339 Major depressive disorder, recurrent, unspecified: Secondary | ICD-10-CM | POA: Diagnosis not present

## 2015-06-17 ENCOUNTER — Ambulatory Visit (INDEPENDENT_AMBULATORY_CARE_PROVIDER_SITE_OTHER): Payer: Medicare Other

## 2015-06-17 DIAGNOSIS — E291 Testicular hypofunction: Secondary | ICD-10-CM | POA: Diagnosis not present

## 2015-06-17 DIAGNOSIS — J309 Allergic rhinitis, unspecified: Secondary | ICD-10-CM | POA: Diagnosis not present

## 2015-07-01 DIAGNOSIS — E291 Testicular hypofunction: Secondary | ICD-10-CM | POA: Diagnosis not present

## 2015-07-01 DIAGNOSIS — H6120 Impacted cerumen, unspecified ear: Secondary | ICD-10-CM | POA: Diagnosis not present

## 2015-07-16 ENCOUNTER — Ambulatory Visit (INDEPENDENT_AMBULATORY_CARE_PROVIDER_SITE_OTHER): Payer: Medicare Other

## 2015-07-16 DIAGNOSIS — J309 Allergic rhinitis, unspecified: Secondary | ICD-10-CM | POA: Diagnosis not present

## 2015-07-17 DIAGNOSIS — E291 Testicular hypofunction: Secondary | ICD-10-CM | POA: Diagnosis not present

## 2015-07-31 DIAGNOSIS — E291 Testicular hypofunction: Secondary | ICD-10-CM | POA: Diagnosis not present

## 2015-08-09 DIAGNOSIS — H04123 Dry eye syndrome of bilateral lacrimal glands: Secondary | ICD-10-CM | POA: Diagnosis not present

## 2015-08-09 DIAGNOSIS — Z961 Presence of intraocular lens: Secondary | ICD-10-CM | POA: Diagnosis not present

## 2015-08-09 DIAGNOSIS — H35373 Puckering of macula, bilateral: Secondary | ICD-10-CM | POA: Diagnosis not present

## 2015-08-11 ENCOUNTER — Encounter: Payer: Self-pay | Admitting: Internal Medicine

## 2015-08-12 ENCOUNTER — Ambulatory Visit: Payer: Medicare Other

## 2015-08-14 ENCOUNTER — Ambulatory Visit (INDEPENDENT_AMBULATORY_CARE_PROVIDER_SITE_OTHER): Payer: Medicare Other

## 2015-08-14 DIAGNOSIS — E291 Testicular hypofunction: Secondary | ICD-10-CM | POA: Diagnosis not present

## 2015-08-14 DIAGNOSIS — J309 Allergic rhinitis, unspecified: Secondary | ICD-10-CM | POA: Diagnosis not present

## 2015-08-28 DIAGNOSIS — E291 Testicular hypofunction: Secondary | ICD-10-CM | POA: Diagnosis not present

## 2015-09-11 ENCOUNTER — Ambulatory Visit (INDEPENDENT_AMBULATORY_CARE_PROVIDER_SITE_OTHER): Payer: Medicare Other

## 2015-09-11 DIAGNOSIS — E291 Testicular hypofunction: Secondary | ICD-10-CM | POA: Diagnosis not present

## 2015-09-11 DIAGNOSIS — J309 Allergic rhinitis, unspecified: Secondary | ICD-10-CM | POA: Diagnosis not present

## 2015-10-01 DIAGNOSIS — E291 Testicular hypofunction: Secondary | ICD-10-CM | POA: Diagnosis not present

## 2015-10-09 ENCOUNTER — Ambulatory Visit (INDEPENDENT_AMBULATORY_CARE_PROVIDER_SITE_OTHER): Payer: Medicare Other | Admitting: *Deleted

## 2015-10-09 DIAGNOSIS — J309 Allergic rhinitis, unspecified: Secondary | ICD-10-CM

## 2015-10-15 DIAGNOSIS — E291 Testicular hypofunction: Secondary | ICD-10-CM | POA: Diagnosis not present

## 2015-10-30 ENCOUNTER — Ambulatory Visit (INDEPENDENT_AMBULATORY_CARE_PROVIDER_SITE_OTHER): Payer: Medicare Other | Admitting: *Deleted

## 2015-10-30 DIAGNOSIS — J019 Acute sinusitis, unspecified: Secondary | ICD-10-CM | POA: Diagnosis not present

## 2015-10-30 DIAGNOSIS — E291 Testicular hypofunction: Secondary | ICD-10-CM | POA: Diagnosis not present

## 2015-10-30 DIAGNOSIS — J309 Allergic rhinitis, unspecified: Secondary | ICD-10-CM

## 2015-11-13 DIAGNOSIS — E291 Testicular hypofunction: Secondary | ICD-10-CM | POA: Diagnosis not present

## 2015-11-27 ENCOUNTER — Ambulatory Visit (INDEPENDENT_AMBULATORY_CARE_PROVIDER_SITE_OTHER): Payer: Medicare Other | Admitting: *Deleted

## 2015-11-27 DIAGNOSIS — J309 Allergic rhinitis, unspecified: Secondary | ICD-10-CM | POA: Diagnosis not present

## 2015-11-27 DIAGNOSIS — E291 Testicular hypofunction: Secondary | ICD-10-CM | POA: Diagnosis not present

## 2015-11-29 ENCOUNTER — Telehealth: Payer: Self-pay | Admitting: Internal Medicine

## 2015-11-29 DIAGNOSIS — J309 Allergic rhinitis, unspecified: Secondary | ICD-10-CM | POA: Diagnosis not present

## 2015-11-29 NOTE — Telephone Encounter (Signed)
Allergy Serum Extract Date Mixed: 11/29/15 Vial: 1 Strength: 1:2 Here/Mail/Pick Up: here Mixed By: tbs Last OV: 12/21/14 Pending OV: 12/23/15

## 2015-12-05 ENCOUNTER — Encounter: Payer: Self-pay | Admitting: Internal Medicine

## 2015-12-09 DIAGNOSIS — M8580 Other specified disorders of bone density and structure, unspecified site: Secondary | ICD-10-CM | POA: Diagnosis not present

## 2015-12-09 DIAGNOSIS — Z0000 Encounter for general adult medical examination without abnormal findings: Secondary | ICD-10-CM | POA: Diagnosis not present

## 2015-12-09 DIAGNOSIS — Z125 Encounter for screening for malignant neoplasm of prostate: Secondary | ICD-10-CM | POA: Diagnosis not present

## 2015-12-09 DIAGNOSIS — Z23 Encounter for immunization: Secondary | ICD-10-CM | POA: Diagnosis not present

## 2015-12-09 DIAGNOSIS — Z8781 Personal history of (healed) traumatic fracture: Secondary | ICD-10-CM | POA: Diagnosis not present

## 2015-12-09 DIAGNOSIS — M859 Disorder of bone density and structure, unspecified: Secondary | ICD-10-CM | POA: Diagnosis not present

## 2015-12-09 DIAGNOSIS — E785 Hyperlipidemia, unspecified: Secondary | ICD-10-CM | POA: Diagnosis not present

## 2015-12-09 DIAGNOSIS — I129 Hypertensive chronic kidney disease with stage 1 through stage 4 chronic kidney disease, or unspecified chronic kidney disease: Secondary | ICD-10-CM | POA: Diagnosis not present

## 2015-12-09 DIAGNOSIS — I10 Essential (primary) hypertension: Secondary | ICD-10-CM | POA: Diagnosis not present

## 2015-12-09 DIAGNOSIS — Z1389 Encounter for screening for other disorder: Secondary | ICD-10-CM | POA: Diagnosis not present

## 2015-12-12 DIAGNOSIS — E291 Testicular hypofunction: Secondary | ICD-10-CM | POA: Diagnosis not present

## 2015-12-17 DIAGNOSIS — F3341 Major depressive disorder, recurrent, in partial remission: Secondary | ICD-10-CM | POA: Diagnosis not present

## 2015-12-17 DIAGNOSIS — F17291 Nicotine dependence, other tobacco product, in remission: Secondary | ICD-10-CM | POA: Diagnosis not present

## 2015-12-17 DIAGNOSIS — E785 Hyperlipidemia, unspecified: Secondary | ICD-10-CM | POA: Diagnosis not present

## 2015-12-17 DIAGNOSIS — I129 Hypertensive chronic kidney disease with stage 1 through stage 4 chronic kidney disease, or unspecified chronic kidney disease: Secondary | ICD-10-CM | POA: Diagnosis not present

## 2015-12-23 ENCOUNTER — Ambulatory Visit (INDEPENDENT_AMBULATORY_CARE_PROVIDER_SITE_OTHER): Payer: Medicare Other | Admitting: Internal Medicine

## 2015-12-23 ENCOUNTER — Encounter: Payer: Self-pay | Admitting: Internal Medicine

## 2015-12-23 VITALS — BP 124/66 | HR 72 | Ht 70.0 in | Wt 296.0 lb

## 2015-12-23 DIAGNOSIS — J309 Allergic rhinitis, unspecified: Secondary | ICD-10-CM

## 2015-12-23 DIAGNOSIS — G4733 Obstructive sleep apnea (adult) (pediatric): Secondary | ICD-10-CM | POA: Diagnosis not present

## 2015-12-23 DIAGNOSIS — J302 Other seasonal allergic rhinitis: Secondary | ICD-10-CM

## 2015-12-23 DIAGNOSIS — J3089 Other allergic rhinitis: Secondary | ICD-10-CM

## 2015-12-23 NOTE — Patient Instructions (Signed)
Ok to coninue getting asthma medicines through your primary physician  Ok to continue your VPAP through [facility]. You can call them about pressure adjustments and renewal orders for supplies  As discussed- we can continue allergy vaccine through this year, or stop at any time  Please call as needed

## 2015-12-23 NOTE — Progress Notes (Signed)
06/21/14- 72 yoM former smoker Self referral; moved here about 1 year ago and wants to establish here for allergy as well. Currently on vaccine through Dr Suanne Marker,  ([place]) Followed here by Dr Gwenette Greet for OSA             Has had flu vax History of allergic rhinitis, worse in spring and fall but perennial. Allergy evaluation in [place] with positive skin tests and started allergy vaccine in 2012. He feels he has been doing better on allergy shots. Wants to continue here. We are requesting leftover vaccine and shot records from his previous allergist. History of nasal polyps on CT scan. No recognized problems with aspirin and takes daily BASA. No ENT surgery. Asthma-mild intermittent, using only occasional rescue inhaler. He is unclear about specific triggers other than seasonal pollen changes.   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 Living in new home he built himself. Small dogs, no mold, no smokers. Dry skin often itches but without history of urticaria or atopic dermatitis. Denies food or insect sting problems.  09/20/14-68 yoM former smoker Self referral; moved here about 1 year ago and wants to establish here for allergy as well. Currently on vaccine 1:2 GH FOLLOWS FOR: Pt on our vaccine now and doing well; pt has questions about Advair to discuss with MD  12/21/14- 68 yoM former smoker followed for  Allergic rhinitis, asthma. Currently on Allergy vaccine 1:2 GH, complicated by OSA/CPAP/[facility]BU Reports: breathing doing well; feels like nose has some constriction and can't breath as well through it;  CPAP 18/ Followed  at [facility] He feels allergy control now is good. House is 39-year-old with no mold. Eyes water occasionally and we discussed dry eye. Sometimes nose feels constricted and he mouth breathes. We discussed mechanical pressure of his CPAP mask. He was told in the past that CT of his sinuses showed polyps. He declined surgery. No wheeze or cough,  continuing Advair 250  12/23/2015-70 year old male former smoker followed for Allergic rhinitis, asthma, complicated by OSA/CPAP/WFBU CPAP [facility] VPAP max Insp 25, Min Exp 12 managed by [facility] Allergy Vaccine 1:2 [place] QM FOLLOWS FOR: DME [facility]-pt wears CPAP nightly-DL attached; will need new order for supplies. Continues allergy vaccine and denies any reactions.   ROS-see HPI Constitutional:   No-   weight loss, night sweats, fevers, chills, fatigue, lassitude. HEENT:   No-  headaches, difficulty swallowing, tooth/dental problems, sore throat,       sneezing, itching, ear ache, + nasal congestion, + post nasal drip,  CV:  No-   chest pain, orthopnea, PND, swelling in lower extremities, anasarca,                                                             dizziness, palpitations Resp: +  shortness of breath with exertion or at rest.              No-   productive cough,  + non-productive cough,  No- coughing up of blood.              No-   change in color of mucus.  No- wheezing.   Skin: No-   rash or lesions. GI:  No-   heartburn, indigestion, abdominal pain, nausea, vomiting, diarrhea,  change in bowel habits, loss of appetite GU: No-   dysuria, change in color of urine, no urgency or frequency.  No- flank pain. MS:  + joint pain or swelling.  No- decreased range of motion.  No- back pain. Neuro-     nothing unusual Psych:  No- change in mood or affect. No depression or anxiety.  No memory loss.  OBJ- Physical Exam General- Alert, Oriented, Affect-appropriate, Distress- none acute, + obese Skin- rash-none, lesions- none, excoriation- none, + dry hyperkeratotic Lymphadenopathy- none Head- atraumatic            Eyes- Gross vision intact, PERRLA, conjunctivae and secretions clear            Ears- Hearing, canals-normal            Nose- Clear, no-Septal dev, mucus, no-visible polyps, erosion, perforation             Throat- Mallampati IV , mucosa clear , drainage- none,  tonsils- atrophic Neck- flexible , trachea midline, no stridor , thyroid nl, carotid no bruit Chest - symmetrical excursion , unlabored           Heart/CV- RRR , no murmur , no gallop  , no rub, nl s1 s2                           - JVD- none , edema- none, stasis changes- none, varices- none           Lung- clear to P&A, wheeze- none, cough- none , dullness-none, rub- none           Chest wall-  Abd-  Br/ Gen/ Rectal- Not done, not indicated Extrem- cyanosis- none, clubbing, none, atrophy- none, strength- nl Neuro- grossly intact to observation

## 2015-12-25 ENCOUNTER — Ambulatory Visit (INDEPENDENT_AMBULATORY_CARE_PROVIDER_SITE_OTHER): Payer: Medicare Other | Admitting: *Deleted

## 2015-12-25 DIAGNOSIS — J309 Allergic rhinitis, unspecified: Secondary | ICD-10-CM

## 2015-12-25 NOTE — Assessment & Plan Note (Signed)
CPAP [facility] VPAP max Insp 25, Min Exp 12 managed by [facility] He needs replacement supplies. Since he is being managed from [facility], we asked that he contact them. He describes good compliance and control.

## 2015-12-25 NOTE — Assessment & Plan Note (Signed)
Vaccine covers cat and dog. He denies problems. We discussed anticipated closing of the allergy clinic at this office next year. He will have option to stop vaccine or transfer care to another allergy practice.

## 2016-01-02 DIAGNOSIS — E291 Testicular hypofunction: Secondary | ICD-10-CM | POA: Diagnosis not present

## 2016-01-03 DIAGNOSIS — R9431 Abnormal electrocardiogram [ECG] [EKG]: Secondary | ICD-10-CM | POA: Diagnosis not present

## 2016-01-09 ENCOUNTER — Other Ambulatory Visit (HOSPITAL_COMMUNITY): Payer: Self-pay | Admitting: Internal Medicine

## 2016-01-09 DIAGNOSIS — R9439 Abnormal result of other cardiovascular function study: Secondary | ICD-10-CM

## 2016-01-13 ENCOUNTER — Encounter: Payer: Self-pay | Admitting: Internal Medicine

## 2016-01-15 DIAGNOSIS — E291 Testicular hypofunction: Secondary | ICD-10-CM | POA: Diagnosis not present

## 2016-01-16 DIAGNOSIS — M6748 Ganglion, other site: Secondary | ICD-10-CM | POA: Diagnosis not present

## 2016-01-16 DIAGNOSIS — L729 Follicular cyst of the skin and subcutaneous tissue, unspecified: Secondary | ICD-10-CM | POA: Diagnosis not present

## 2016-01-22 ENCOUNTER — Telehealth (HOSPITAL_COMMUNITY): Payer: Self-pay | Admitting: *Deleted

## 2016-01-22 NOTE — Telephone Encounter (Signed)
Patient given detailed instructions per Myocardial Perfusion Study Information Sheet for the test on 01/27/16. Patient notified to arrive 15 minutes early and that it is imperative to arrive on time for appointment to keep from having the test rescheduled.  If you need to cancel or reschedule your appointment, please call the office within 24 hours of your appointment. Failure to do so may result in a cancellation of your appointment, and a $50 no show fee. Patient verbalized understanding. Hubbard Robinson, RN

## 2016-01-27 ENCOUNTER — Ambulatory Visit (HOSPITAL_COMMUNITY): Payer: Medicare Other | Attending: Cardiology

## 2016-01-27 ENCOUNTER — Ambulatory Visit: Payer: Medicare Other

## 2016-01-27 DIAGNOSIS — R9439 Abnormal result of other cardiovascular function study: Secondary | ICD-10-CM | POA: Insufficient documentation

## 2016-01-27 DIAGNOSIS — I10 Essential (primary) hypertension: Secondary | ICD-10-CM | POA: Diagnosis not present

## 2016-01-27 IMAGING — NM NM MISC PROCEDURE
9 series · 54 of 54 positions shown · non-contrast
Comparison: none

[Series 1: stress-sum-em · 6.40mm/px · 6 of 64 frames shown]
[frame 6/64]
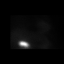
[frame 16/64]
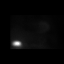
[frame 27/64]
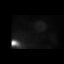
[frame 38/64]
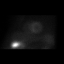
[frame 48/64]
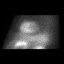
[frame 59/64]
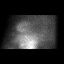

[Series 1: wbr_s-proj_st stress-gsp · 6.40mm/px · 6 of 512 frames shown]
[frame 43/512]
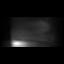
[frame 128/512]
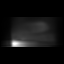
[frame 214/512]
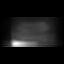
[frame 299/512]
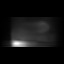
[frame 384/512]
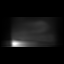
[frame 470/512]
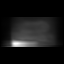

[Series 1: stress-gsp_(id)_sa · 6.4mm · 6.40mm/px · 6 of 512 frames shown]
[frame 43/512]
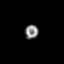
[frame 128/512]
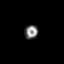
[frame 214/512]
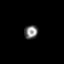
[frame 299/512]
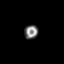
[frame 384/512]
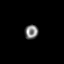
[frame 470/512]
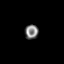

[Series 1: wbr_s-proj_st stress-sum-em · 6.40mm/px · 6 of 64 frames shown]
[frame 6/64]
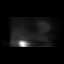
[frame 16/64]
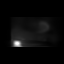
[frame 27/64]
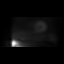
[frame 38/64]
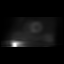
[frame 48/64]
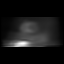
[frame 59/64]
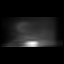

[Series 1: stress-gsp · 6.40mm/px · 6 of 512 frames shown]
[frame 43/512]
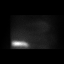
[frame 128/512]
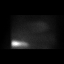
[frame 214/512]
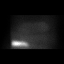
[frame 299/512]
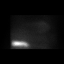
[frame 384/512]
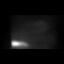
[frame 470/512]
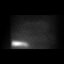

[Series 1: stress-sum-em_(id)_sa · 6.4mm · 6.40mm/px · 6 of 64 frames shown]
[frame 6/64]
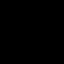
[frame 16/64]
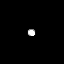
[frame 27/64]
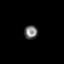
[frame 38/64]
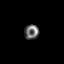
[frame 48/64]
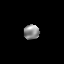
[frame 59/64]
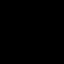

[Series 1: rest · 6.40mm/px · 6 of 64 frames shown]
[frame 6/64]
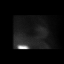
[frame 16/64]
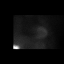
[frame 27/64]
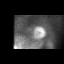
[frame 38/64]
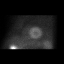
[frame 48/64]
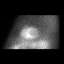
[frame 59/64]
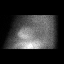

[Series 1: rest_(id)_sa · 6.4mm · 6.40mm/px · 6 of 64 frames shown]
[frame 6/64]
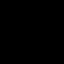
[frame 16/64]
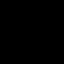
[frame 27/64]
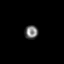
[frame 38/64]
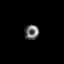
[frame 48/64]
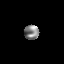
[frame 59/64]
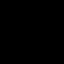

[Series 1: wbr_r-proj_st rest · 6.40mm/px · 6 of 64 frames shown]
[frame 6/64]
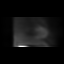
[frame 16/64]
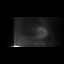
[frame 27/64]
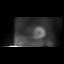
[frame 38/64]
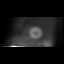
[frame 48/64]
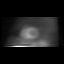
[frame 59/64]
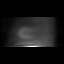

[54 of 54 positions shown; findings below may reference images not displayed]

Canned report from images found in remote index.

Refer to host system for actual result text.

## 2016-01-27 MED ORDER — REGADENOSON 0.4 MG/5ML IV SOLN
0.4000 mg | Freq: Once | INTRAVENOUS | Status: AC
  Administered 2016-01-27: 0.4 mg via INTRAVENOUS

## 2016-01-27 MED ORDER — TECHNETIUM TC 99M TETROFOSMIN IV KIT
33.0000 | PACK | Freq: Once | INTRAVENOUS | Status: AC | PRN
  Administered 2016-01-27: 33 via INTRAVENOUS
  Filled 2016-01-27: qty 33

## 2016-01-28 ENCOUNTER — Ambulatory Visit (HOSPITAL_COMMUNITY): Payer: Medicare Other | Attending: Cardiology

## 2016-01-28 MED ORDER — TECHNETIUM TC 99M TETROFOSMIN IV KIT
32.8000 | PACK | Freq: Once | INTRAVENOUS | Status: AC | PRN
  Administered 2016-01-28: 32.8 via INTRAVENOUS
  Filled 2016-01-28: qty 33

## 2016-01-29 DIAGNOSIS — E291 Testicular hypofunction: Secondary | ICD-10-CM | POA: Diagnosis not present

## 2016-01-29 LAB — MYOCARDIAL PERFUSION IMAGING
LV dias vol: 128 mL (ref 62–150)
LV sys vol: 46 mL
Peak HR: 55 {beats}/min
RATE: 0.18
Rest HR: 55 {beats}/min
SDS: 1
SRS: 0
SSS: 1
TID: 0.87

## 2016-02-03 DIAGNOSIS — L738 Other specified follicular disorders: Secondary | ICD-10-CM | POA: Diagnosis not present

## 2016-02-11 DIAGNOSIS — E291 Testicular hypofunction: Secondary | ICD-10-CM | POA: Diagnosis not present

## 2016-02-26 DIAGNOSIS — E291 Testicular hypofunction: Secondary | ICD-10-CM | POA: Diagnosis not present

## 2016-03-11 ENCOUNTER — Ambulatory Visit (INDEPENDENT_AMBULATORY_CARE_PROVIDER_SITE_OTHER): Payer: Medicare Other

## 2016-03-11 DIAGNOSIS — E291 Testicular hypofunction: Secondary | ICD-10-CM | POA: Diagnosis not present

## 2016-03-11 DIAGNOSIS — J309 Allergic rhinitis, unspecified: Secondary | ICD-10-CM

## 2016-03-16 ENCOUNTER — Other Ambulatory Visit: Payer: Self-pay

## 2016-03-18 ENCOUNTER — Ambulatory Visit (INDEPENDENT_AMBULATORY_CARE_PROVIDER_SITE_OTHER): Payer: Medicare Other | Admitting: *Deleted

## 2016-03-18 DIAGNOSIS — J309 Allergic rhinitis, unspecified: Secondary | ICD-10-CM

## 2016-03-25 ENCOUNTER — Ambulatory Visit: Payer: Medicare Other

## 2016-03-26 ENCOUNTER — Ambulatory Visit (INDEPENDENT_AMBULATORY_CARE_PROVIDER_SITE_OTHER): Payer: Medicare Other | Admitting: *Deleted

## 2016-03-26 DIAGNOSIS — J309 Allergic rhinitis, unspecified: Secondary | ICD-10-CM

## 2016-04-22 ENCOUNTER — Ambulatory Visit (INDEPENDENT_AMBULATORY_CARE_PROVIDER_SITE_OTHER): Payer: Medicare Other | Admitting: *Deleted

## 2016-04-22 DIAGNOSIS — J309 Allergic rhinitis, unspecified: Secondary | ICD-10-CM | POA: Diagnosis not present

## 2016-05-05 DIAGNOSIS — E291 Testicular hypofunction: Secondary | ICD-10-CM | POA: Diagnosis not present

## 2016-05-19 DIAGNOSIS — E291 Testicular hypofunction: Secondary | ICD-10-CM | POA: Diagnosis not present

## 2016-05-20 ENCOUNTER — Ambulatory Visit (INDEPENDENT_AMBULATORY_CARE_PROVIDER_SITE_OTHER): Payer: Medicare Other | Admitting: *Deleted

## 2016-05-20 ENCOUNTER — Ambulatory Visit (INDEPENDENT_AMBULATORY_CARE_PROVIDER_SITE_OTHER): Payer: Medicare Other

## 2016-05-20 DIAGNOSIS — Z23 Encounter for immunization: Secondary | ICD-10-CM | POA: Diagnosis not present

## 2016-05-20 DIAGNOSIS — J309 Allergic rhinitis, unspecified: Secondary | ICD-10-CM | POA: Diagnosis not present

## 2016-06-02 DIAGNOSIS — E291 Testicular hypofunction: Secondary | ICD-10-CM | POA: Diagnosis not present

## 2016-06-16 DIAGNOSIS — E291 Testicular hypofunction: Secondary | ICD-10-CM | POA: Diagnosis not present

## 2016-06-17 ENCOUNTER — Ambulatory Visit (INDEPENDENT_AMBULATORY_CARE_PROVIDER_SITE_OTHER): Payer: Medicare Other | Admitting: *Deleted

## 2016-06-17 DIAGNOSIS — J309 Allergic rhinitis, unspecified: Secondary | ICD-10-CM | POA: Diagnosis not present

## 2016-06-30 DIAGNOSIS — E291 Testicular hypofunction: Secondary | ICD-10-CM | POA: Diagnosis not present

## 2016-07-14 ENCOUNTER — Ambulatory Visit: Payer: Medicare Other | Admitting: *Deleted

## 2016-07-14 DIAGNOSIS — J309 Allergic rhinitis, unspecified: Secondary | ICD-10-CM

## 2016-07-15 DIAGNOSIS — E291 Testicular hypofunction: Secondary | ICD-10-CM | POA: Diagnosis not present

## 2016-07-29 DIAGNOSIS — E291 Testicular hypofunction: Secondary | ICD-10-CM | POA: Diagnosis not present

## 2016-07-30 DIAGNOSIS — E785 Hyperlipidemia, unspecified: Secondary | ICD-10-CM | POA: Diagnosis not present

## 2016-07-30 DIAGNOSIS — M8580 Other specified disorders of bone density and structure, unspecified site: Secondary | ICD-10-CM | POA: Diagnosis not present

## 2016-07-30 DIAGNOSIS — E559 Vitamin D deficiency, unspecified: Secondary | ICD-10-CM | POA: Diagnosis not present

## 2016-07-30 DIAGNOSIS — I10 Essential (primary) hypertension: Secondary | ICD-10-CM | POA: Diagnosis not present

## 2016-07-30 DIAGNOSIS — I129 Hypertensive chronic kidney disease with stage 1 through stage 4 chronic kidney disease, or unspecified chronic kidney disease: Secondary | ICD-10-CM | POA: Diagnosis not present

## 2016-07-30 DIAGNOSIS — E291 Testicular hypofunction: Secondary | ICD-10-CM | POA: Diagnosis not present

## 2016-07-31 DIAGNOSIS — H01025 Squamous blepharitis left lower eyelid: Secondary | ICD-10-CM | POA: Diagnosis not present

## 2016-07-31 DIAGNOSIS — Z961 Presence of intraocular lens: Secondary | ICD-10-CM | POA: Diagnosis not present

## 2016-07-31 DIAGNOSIS — H04123 Dry eye syndrome of bilateral lacrimal glands: Secondary | ICD-10-CM | POA: Diagnosis not present

## 2016-07-31 DIAGNOSIS — H43392 Other vitreous opacities, left eye: Secondary | ICD-10-CM | POA: Diagnosis not present

## 2016-07-31 DIAGNOSIS — H01022 Squamous blepharitis right lower eyelid: Secondary | ICD-10-CM | POA: Diagnosis not present

## 2016-07-31 DIAGNOSIS — H35373 Puckering of macula, bilateral: Secondary | ICD-10-CM | POA: Diagnosis not present

## 2016-08-04 DIAGNOSIS — E785 Hyperlipidemia, unspecified: Secondary | ICD-10-CM | POA: Diagnosis not present

## 2016-08-04 DIAGNOSIS — N183 Chronic kidney disease, stage 3 (moderate): Secondary | ICD-10-CM | POA: Diagnosis not present

## 2016-08-04 DIAGNOSIS — I129 Hypertensive chronic kidney disease with stage 1 through stage 4 chronic kidney disease, or unspecified chronic kidney disease: Secondary | ICD-10-CM | POA: Diagnosis not present

## 2016-08-04 DIAGNOSIS — F3341 Major depressive disorder, recurrent, in partial remission: Secondary | ICD-10-CM | POA: Diagnosis not present

## 2016-08-12 ENCOUNTER — Ambulatory Visit (INDEPENDENT_AMBULATORY_CARE_PROVIDER_SITE_OTHER): Payer: Medicare Other | Admitting: Allergy & Immunology

## 2016-08-12 ENCOUNTER — Encounter: Payer: Self-pay | Admitting: Allergy & Immunology

## 2016-08-12 VITALS — BP 130/82 | HR 66 | Temp 97.8°F | Ht 67.0 in | Wt 303.3 lb

## 2016-08-12 DIAGNOSIS — J4540 Moderate persistent asthma, uncomplicated: Secondary | ICD-10-CM

## 2016-08-12 DIAGNOSIS — J3089 Other allergic rhinitis: Secondary | ICD-10-CM | POA: Diagnosis not present

## 2016-08-12 DIAGNOSIS — E291 Testicular hypofunction: Secondary | ICD-10-CM | POA: Diagnosis not present

## 2016-08-12 DIAGNOSIS — G4733 Obstructive sleep apnea (adult) (pediatric): Secondary | ICD-10-CM | POA: Diagnosis not present

## 2016-08-12 HISTORY — DX: Moderate persistent asthma, uncomplicated: J45.40

## 2016-08-12 MED ORDER — AZELASTINE HCL 0.1 % NA SOLN: 1.0000 | Freq: Two times a day (BID) | NASAL | 3 refills | Status: DC

## 2016-08-12 MED ORDER — FLUTICASONE-SALMETEROL 113-14 MCG/ACT IN AEPB: 1.0000 | INHALATION_SPRAY | Freq: Two times a day (BID) | RESPIRATORY_TRACT | 3 refills | Status: DC

## 2016-08-12 MED ORDER — ALBUTEROL SULFATE HFA 108 (90 BASE) MCG/ACT IN AERS: 4.0000 | INHALATION_SPRAY | RESPIRATORY_TRACT | 2 refills | Status: DC | PRN

## 2016-08-12 NOTE — Patient Instructions (Addendum)
1. Perennial allergic rhinitis - We will get some blood work to see if you have evidence of allergies. - Continue with nasal saline lavage twice daily. - Continue with Nasacort one spray per nostril twice daily. - Add Astelin nasal spray one spray per nostril twice daily.   2. Moderate persistent asthma, uncomplicated - Lung testing looked great today. - We will try switching to a cheaper medication: AirDuo 113/14 one inhalation daily. - Daily controller medication(s): AirDuo 113/14 one inhalation daily. - Rescue medications: ProAir 4 puffs every 4-6 hours as needed - Asthma control goals:  * Full participation in all desired activities (may need albuterol before activity) * Albuterol use two time or less a week on average (not counting use with activity) * Cough interfering with sleep two time or less a month * Oral steroids no more than once a year * No hospitalizations  3. OSA (obstructive sleep apnea) - Continue with CPAP nightly.  4. Return in about 3 months (around 11/10/2016).  Please inform us of any Emergency Department visits, hospitalizations, or changes in symptoms. Call us before going to the ED for breathing or allergy symptoms since we might be able to fit you in for a sick visit. Feel free to contact us anytime with any questions, problems, or concerns.  It was a pleasure to meet you today! Best wishes in the [place] Year!   Websites that have reliable patient information: 1. American Academy of Asthma, Allergy, and Immunology: www.aaaai.org 2. Food Allergy Research and Education (FARE): foodallergy.org 3. Mothers of Asthmatics: http://www.asthmacommunitynetwork.org 4. American College of Allergy, Asthma, and Immunology: www.acaai.org

## 2016-08-12 NOTE — Progress Notes (Signed)
NEW PATIENT  Date of Service/Encounter:  08/12/16  Referring provider: Thressa Sheller, MD   Assessment:   Perennial allergic rhinitis - Plan: Allergen Zone 3  Moderate persistent asthma, uncomplicated  OSA (obstructive sleep apnea)   Asthma Reportables:  Severity: moderate persistent  Risk: low Control: well controlled  Seasonal Influenza Vaccine: yes   Plan/Recommendations:   1. Perennial allergic rhinitis - We will get some blood work to see if [name] has evidence of allergies. - I did receive his allergy injection today. - Continue with nasal saline lavage twice daily. - Continue with Nasacort one spray per nostril twice daily. - Add Astelin nasal spray one spray per nostril twice daily.   2. Moderate persistent asthma, uncomplicated - Lung testing looked great today. - We will try switching to a cheaper medication: AirDuo 113/14 one inhalation twice daily. - Daily controller medication(s): AirDuo 113/14 one inhalation twice daily. - Rescue medications: ProAir 4 puffs every 4-6 hours as needed - Asthma control goals:  * Full participation in all desired activities (may need albuterol before activity) * Albuterol use two time or less a week on average (not counting use with activity) * Cough interfering with sleep two time or less a month * Oral steroids no more than once a year * No hospitalizations  3. OSA (obstructive sleep apnea) - Continue with CPAP nightly.  4. Return in about 3 months (around 11/10/2016).   Subjective:   [place] is a 71 y.o. male presenting today for evaluation of  Chief Complaint  Patient presents with  . New Evaluation    Transfer of care from Dr. Farrel Gobble has a history of the following: Patient Active Problem List   Diagnosis Date Noted  . Moderate persistent asthma, uncomplicated 0000000  . Asthma, mild intermittent, well-controlled 12/22/2014  . Perennial allergic rhinitis 06/21/2014    . Nasal polyps 06/21/2014  . Obesity 06/21/2014  . OSA (obstructive sleep apnea) 06/05/2014    History obtained from: chart review and patient.  [place] was referred by Thressa Sheller, MD.     [place] is a 71 y.o. male presenting to establish care. He was most recently followed by Dr. Annamaria Boots and prior to that was followed by Dr. Elpidio Eric at the [facility] in [place]  He has a history of allergic rhinitis as well as asthma. He receives immunotherapy weekly that contains cat and dog (0.5 mL 1:2 concentration per the last injection note). He is on monthly immunotherapy.   [place] was first diagnosed with allergies around 5-6 years ago. At the time, he was worked up in [place], [place]. [place] has asthma (diagnosed 10-11 years ago) and is on Singulair daily as well as Advair 250/50 one puff twice daily. He feels that this is working but his prescriptions have become so expensive. He has never been on Symbicort in the past or other combined ICS/LABA. Even after the doughnut hole, he pays $60-$70 per month. He has now changed to once puff once daily.   [place] also had allergy testing that was positive to cats and dogs. He moved down to [place] three years ago and transferred his care to Dr. Annamaria Boots. He does feel better on the allergy shots and feels that he was breathing much better. He uses Nasacort twice daily and ipratropium nasal spray as needed. He has thought of getting off of his shots. He has two dogs at home who do not  shed much. He has never been on Astelin or Dymista. Last shot was the end of December.   [place] has a diagnosis of OSA and is on CPAP. He was iagnosed with nasal polyps at some point. Surgery was recommended but he never went through with it. He does NOT have NSAID sensitivity.  Despite the medications, he does feel short of breath at times. He was more fully worked up for this within the last few months. His PCP did do an EKG that showed  some abnormalities. He then had a stress test done and was out of breath in a pretty short time per the patient. According to the patient there was something abnormal so he was sent for a nuclear medicine test that was normal.  Otherwise, there is no history of other atopic diseases, including drug allergies, food allergies, stinging insect allergies, or urticaria. There is no significant infectious history. Vaccinations are up to date.    Past Medical History: Patient Active Problem List   Diagnosis Date Noted  . Moderate persistent asthma, uncomplicated 0000000  . Asthma, mild intermittent, well-controlled 12/22/2014  . Perennial allergic rhinitis 06/21/2014  . Nasal polyps 06/21/2014  . Obesity 06/21/2014  . OSA (obstructive sleep apnea) 06/05/2014    Medication List:  Allergies as of 08/12/2016      Reactions   Lisinopril    Swollen lips   Sulfa Antibiotics    hives      Medication List       Accurate as of 08/12/16 12:00 PM. Always use your most recent med list.          aspirin 81 MG tablet Take 81 mg by mouth daily.   cetirizine 10 MG tablet Commonly known as:  ZYRTEC Take 10 mg by mouth daily.   Fluticasone-Salmeterol 250-50 MCG/DOSE Aepb Commonly known as:  ADVAIR DISKUS Inhale 1 puff into the lungs 2 (two) times daily.   indapamide 2.5 MG tablet Commonly known as:  LOZOL Once dailt   ipratropium 0.03 % nasal spray Commonly known as:  ATROVENT As needed   montelukast 10 MG tablet Commonly known as:  SINGULAIR Once daily   NASACORT ALLERGY 24HR 55 MCG/ACT Aero nasal inhaler Generic drug:  triamcinolone Place 1 spray into the nose 2 (two) times daily.   NONFORMULARY OR COMPOUNDED ITEM Allergy Vaccine 1:2 Given at [facility]   PROAIR HFA 108 (90 Base) MCG/ACT inhaler Generic drug:  albuterol 2 puffs every 6 hrs as needed   simvastatin 40 MG tablet Commonly known as:  ZOCOR Once daily   testosterone cypionate 100 MG/ML  injection Commonly known as:  DEPOTESTOTERONE CYPIONATE   Vitamin D 2000 units Caps Take 1 capsule by mouth daily.       Birth History: non-contributory.  Developmental History non-contributory.  Past Surgical History: History reviewed. No pertinent surgical history.   Family History: Family History  Problem Relation Age of Onset  . Heart attack Father   . Cancer Maternal Grandfather   . Multiple sclerosis Son     youngest  . Allergic rhinitis Neg Hx   . Angioedema Neg Hx   . Asthma Neg Hx   . Atopy Neg Hx   . Eczema Neg Hx   . Immunodeficiency Neg Hx   . Urticaria Neg Hx      Social History: [place] lives at home with His wife. They live in [address]. They have hardwood in the main living area is carpeting in the bedrooms. They  have gas heating and central cooling. There were 2 small dogs or non-shedding. Do not have dust mite covers. There is no tobacco exposure. He has never been a smoker at all. 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 He needs to [place] after he retired because he has 2 sons who live in the area. He has 6 grandchildren who keep him quite busy.   Review of Systems: a 14-point review of systems is pertinent for what is mentioned in HPI.  Otherwise, all other systems were negative. Constitutional: negative other than that listed in the HPI Eyes: negative other than that listed in the HPI Ears, nose, mouth, throat, and face: negative other than that listed in the HPI Respiratory: negative other than that listed in the HPI Cardiovascular: negative other than that listed in the HPI Gastrointestinal: negative other than that listed in the HPI Genitourinary: negative other than that listed in the HPI Integument: negative other than that listed in the HPI Hematologic: negative other than that listed in the HPI Musculoskeletal: negative other than that listed in the HPI Neurological: negative  other than that listed in the HPI Allergy/Immunologic: negative other than that listed in the HPI    Objective:   Blood pressure 130/82, pulse 66, temperature 97.8 F (36.6 C), temperature source Oral, height 5\' 7"  (1.702 m), weight (!) 303 lb 4.8 oz (137.6 kg). Body mass index is 47.5 kg/m.   Physical Exam:  General: Alert, interactive, in no acute distress. Obese male. Friendly.  Eyes: No conjunctival injection present on the right, No conjunctival injection present on the left, PERRL bilaterally, No discharge on the right, No discharge on the left and No Horner-Trantas dots present Ears: Right TM unable to be visualized due to cerumen impaction and Left TM unable to be visualized due to cerumen impaction.  Nose/Throat: External nose within normal limits and septum midline, turbinates markedly edematous and pale with clear discharge, post-pharynx erythematous with cobblestoning in the posterior oropharynx. Tonsils 2+ without exudates Neck: Supple without thyromegaly. Adenopathy: no enlarged lymph nodes appreciated in the anterior cervical, occipital, axillary, epitrochlear, inguinal, or popliteal regions Lungs: Clear to auscultation without wheezing, rhonchi or rales. No increased work of breathing. CV: Normal S1/S2, no murmurs. Capillary refill <2 seconds.  Abdomen: Nondistended, nontender. No guarding or rebound tenderness. Bowel sounds present in all fields and hypoactive  Skin: Warm and dry, without lesions or rashes. Extremities:  No clubbing, cyanosis or edema. Neuro:   Grossly intact. No focal deficits appreciated. Responsive to questions.  Diagnostic studies:  Spirometry: results normal (FEV1: 2.64/96%, FVC: 3.15/90%, FEV1/FVC: 83%).    Spirometry consistent with normal pattern.  Allergy Studies: None (patient had taken cetirizine last night)      Salvatore Marvel, MD [facility]

## 2016-08-18 ENCOUNTER — Encounter: Payer: Self-pay | Admitting: *Deleted

## 2016-08-26 DIAGNOSIS — E291 Testicular hypofunction: Secondary | ICD-10-CM | POA: Diagnosis not present

## 2016-08-27 DIAGNOSIS — R0602 Shortness of breath: Secondary | ICD-10-CM | POA: Diagnosis not present

## 2016-08-27 DIAGNOSIS — F39 Unspecified mood [affective] disorder: Secondary | ICD-10-CM | POA: Diagnosis not present

## 2016-08-28 ENCOUNTER — Other Ambulatory Visit (HOSPITAL_COMMUNITY): Payer: Self-pay | Admitting: Respiratory Therapy

## 2016-08-28 DIAGNOSIS — R0602 Shortness of breath: Secondary | ICD-10-CM

## 2016-09-08 DIAGNOSIS — H6123 Impacted cerumen, bilateral: Secondary | ICD-10-CM | POA: Diagnosis not present

## 2016-09-08 DIAGNOSIS — E291 Testicular hypofunction: Secondary | ICD-10-CM | POA: Diagnosis not present

## 2016-09-16 DIAGNOSIS — H9313 Tinnitus, bilateral: Secondary | ICD-10-CM | POA: Diagnosis not present

## 2016-09-16 DIAGNOSIS — J342 Deviated nasal septum: Secondary | ICD-10-CM | POA: Diagnosis not present

## 2016-09-16 DIAGNOSIS — H903 Sensorineural hearing loss, bilateral: Secondary | ICD-10-CM | POA: Diagnosis not present

## 2016-09-22 DIAGNOSIS — E291 Testicular hypofunction: Secondary | ICD-10-CM | POA: Diagnosis not present

## 2016-09-24 DIAGNOSIS — F418 Other specified anxiety disorders: Secondary | ICD-10-CM | POA: Diagnosis not present

## 2016-10-06 DIAGNOSIS — E291 Testicular hypofunction: Secondary | ICD-10-CM | POA: Diagnosis not present

## 2016-10-15 DIAGNOSIS — E291 Testicular hypofunction: Secondary | ICD-10-CM | POA: Diagnosis not present

## 2016-10-30 DIAGNOSIS — E559 Vitamin D deficiency, unspecified: Secondary | ICD-10-CM | POA: Diagnosis not present

## 2016-10-30 DIAGNOSIS — E291 Testicular hypofunction: Secondary | ICD-10-CM | POA: Diagnosis not present

## 2016-10-30 DIAGNOSIS — E785 Hyperlipidemia, unspecified: Secondary | ICD-10-CM | POA: Diagnosis not present

## 2016-10-30 DIAGNOSIS — I129 Hypertensive chronic kidney disease with stage 1 through stage 4 chronic kidney disease, or unspecified chronic kidney disease: Secondary | ICD-10-CM | POA: Diagnosis not present

## 2016-11-03 ENCOUNTER — Other Ambulatory Visit: Payer: Self-pay | Admitting: Allergy

## 2016-11-03 MED ORDER — AZELASTINE HCL 0.1 % NA SOLN: 1.0000 | Freq: Two times a day (BID) | NASAL | 1 refills | Status: DC

## 2016-11-11 DIAGNOSIS — J45909 Unspecified asthma, uncomplicated: Secondary | ICD-10-CM | POA: Diagnosis not present

## 2016-11-11 DIAGNOSIS — Z7982 Long term (current) use of aspirin: Secondary | ICD-10-CM | POA: Diagnosis not present

## 2016-11-11 DIAGNOSIS — E7800 Pure hypercholesterolemia, unspecified: Secondary | ICD-10-CM | POA: Diagnosis not present

## 2016-11-11 DIAGNOSIS — G4733 Obstructive sleep apnea (adult) (pediatric): Secondary | ICD-10-CM | POA: Diagnosis not present

## 2016-11-11 DIAGNOSIS — Z9989 Dependence on other enabling machines and devices: Secondary | ICD-10-CM | POA: Diagnosis not present

## 2016-11-11 DIAGNOSIS — Z7951 Long term (current) use of inhaled steroids: Secondary | ICD-10-CM | POA: Diagnosis not present

## 2016-11-11 DIAGNOSIS — Z79899 Other long term (current) drug therapy: Secondary | ICD-10-CM | POA: Diagnosis not present

## 2016-11-12 ENCOUNTER — Ambulatory Visit (INDEPENDENT_AMBULATORY_CARE_PROVIDER_SITE_OTHER): Payer: Medicare Other | Admitting: Allergy & Immunology

## 2016-11-12 ENCOUNTER — Encounter: Payer: Self-pay | Admitting: Allergy & Immunology

## 2016-11-12 ENCOUNTER — Ambulatory Visit: Payer: Medicare Other | Admitting: Allergy & Immunology

## 2016-11-12 VITALS — BP 142/72 | HR 77 | Temp 98.2°F | Resp 20 | Ht 68.5 in | Wt 286.6 lb

## 2016-11-12 DIAGNOSIS — J4540 Moderate persistent asthma, uncomplicated: Secondary | ICD-10-CM

## 2016-11-12 DIAGNOSIS — G4733 Obstructive sleep apnea (adult) (pediatric): Secondary | ICD-10-CM

## 2016-11-12 DIAGNOSIS — J3089 Other allergic rhinitis: Secondary | ICD-10-CM

## 2016-11-12 NOTE — Progress Notes (Signed)
FOLLOW UP  Date of Service/Encounter:  11/12/16   Assessment:   Moderate persistent asthma, uncomplicated  Perennial allergic rhinitis (weeds, molds, dust mite, cat, cockroach)  OSA (obstructive sleep apnea)   Asthma Reportables:  Severity: moderate persistent  Risk: low Control: well controlled  Plan/Recommendations:   1. Perennial allergic rhinitis - Testing was positive to: weeds, molds, dust mite, cat, and cockroach - Avoidance measures provided.  - If you start to have worsening symptoms, we can restart your allergy shots. - The skin testing will allow [place] to have that information to mix the shots.  - Continue with nasal saline lavage twice daily. - Continue with Nasacort one spray per nostril twice daily. - Continue with Astelin nasal spray one spray per nostril twice daily.   2. Moderate persistent asthma, uncomplicated - Lung testing looked great today. - Daily controller medication(s): Advair 250/50 one inhalation once daily - Rescue medications: ProAir 4 puffs every 4-6 hours as needed - Asthma control goals:  * Full participation in all desired activities (may need albuterol before activity) * Albuterol use two time or less a week on average (not counting use with activity) * Cough interfering with sleep two time or less a month * Oral steroids no more than once a year * No hospitalizations  3. OSA (obstructive sleep apnea) - Continue with CPAP nightly.  4. Return in about 6 months (around 05/14/2017).  Subjective:   [place] is a 71 y.o. male presenting today for follow up of  Chief Complaint  Patient presents with  . Allergies  . Asthma  . Immunotherapy    [place] has a history of the following: Patient Active Problem List   Diagnosis Date Noted  . Moderate persistent asthma, uncomplicated 42/59/5638  . Asthma, mild intermittent, well-controlled 12/22/2014  . Perennial allergic rhinitis 06/21/2014  . Nasal polyps  06/21/2014  . Obesity 06/21/2014  . OSA (obstructive sleep apnea) 06/05/2014    History obtained from: chart review and patient.  [place] was referred by Thressa Sheller, MD.     [place] is a 71 y.o. male presenting for a follow up visit. We last saw the patient is a new patient in January 2018. At that time, we did blood testing to look for evidence of allergies since he had not taken his antihistamines recently. It does not seem that this has been done. At the last visit, he was changed to AirDuo 113/14 one inhalation once daily.   Since the last visit, he has done well. He is on the Advair 250/50 one inhalation twice daily. We had tried to change it to a cheaper alternative but it seems that AirDuo is still more expensive. He is spending $280 per month for his Advair until he meets his deductible. He does feel that this is working well. [place]'s asthma has been well controlled. He has not required rescue medication, experienced nocturnal awakenings due to lower respiratory symptoms, nor have activities of daily living been limited.   From a rhinitis perspective he has done well. He feels that that the Astelin once daily is working well. He also using Nasacort on a daily basis as well. He has ipratropium that he uses as needed. He has not had an injection since January and actually does not feel a difference being off of them. Typically his worst time of the year is now but he reports that his symptoms are actually fairly tolerable. He does keep busy in his retirement with projects  around his new house, which was built three years ago [place] He does have two dogs at home as well as keeping active in a workshop.   I did have labs performed at his last visit with his PCP last week, but this seems to be that it was just routine labs. He was noted to have a low vitamin D and is on a supplement 50,000 units once weekly. This was retested and he was still low  for vitamin D. Otherwise, there have been no changes to his past medical history, surgical history, family history, or social history.    Review of Systems: a 14-point review of systems is pertinent for what is mentioned in HPI.  Otherwise, all other systems were negative. Constitutional: negative other than that listed in the HPI Eyes: negative other than that listed in the HPI Ears, nose, mouth, throat, and face: negative other than that listed in the HPI Respiratory: negative other than that listed in the HPI Cardiovascular: negative other than that listed in the HPI Gastrointestinal: negative other than that listed in the HPI Genitourinary: negative other than that listed in the HPI Integument: negative other than that listed in the HPI Hematologic: negative other than that listed in the HPI Musculoskeletal: negative other than that listed in the HPI Neurological: negative other than that listed in the HPI Allergy/Immunologic: negative other than that listed in the HPI    Objective:   Blood pressure (!) 142/72, pulse 77, temperature 98.2 F (36.8 C), temperature source Oral, resp. rate 20, height 5' 8.5" (1.74 m), weight 286 lb 9.6 oz (130 kg), SpO2 97 %. Body mass index is 42.94 kg/m.   Physical Exam:  General: Alert, interactive, in no acute distress. Cooperative with exam.  Eyes: No conjunctival injection present on the right, No conjunctival injection present on the left, PERRL bilaterally, No discharge on the right, No discharge on the left and No Horner-Trantas dots present Ears: Right TM pearly gray with normal light reflex, Left TM pearly gray with normal light reflex, Right TM intact without perforation and Left TM intact without perforation.  Nose/Throat: External nose within normal limits, nasal crease present and septum midline, turbinates edematous and pale with clear discharge, post-pharynx erythematous without cobblestoning in the posterior oropharynx. Tonsils 2+  without exudates Neck: Supple without thyromegaly. Lungs: Clear to auscultation without wheezing, rhonchi or rales. No increased work of breathing. CV: Normal S1/S2, no murmurs. Capillary refill <2 seconds.  Skin: Warm and dry, without lesions or rashes. Neuro:   Grossly intact. No focal deficits appreciated. Responsive to questions.   Diagnostic studies:  Spirometry: results normal (FEV1: 2.57/83%, FVC: 3.49/90%, FEV1/FVC: 73%).    Spirometry consistent with normal pattern.   Allergy Studies:   Indoor/Outdoor Percutaneous Adult Environmental Panel: positive to Alternaria, Phoma, Tricophyton, Df mite and cat. Otherwise negative with adequate controls.  Indoor/Outdoor Selected Intradermal Environmental Panel: positive to weed mix, mold mix #2, mold mix #3 and cockroach. Otherwise negative with adequate controls.    Salvatore Marvel, MD [facility]

## 2016-11-12 NOTE — Patient Instructions (Addendum)
1. Perennial allergic rhinitis - Come back at 3pm for allergy testing today.  - If you start to have worsening symptoms, we can restart your allergy shots. - The skin testing will allow [place] to have that information to mix the shots.  - Continue with nasal saline lavage twice daily. - Continue with Nasacort one spray per nostril twice daily. - Continue with Astelin nasal spray one spray per nostril twice daily.   2. Moderate persistent asthma, uncomplicated - Lung testing looked great today. - Daily controller medication(s): Advair 250/50 one inhalation once daily - Rescue medications: ProAir 4 puffs every 4-6 hours as needed - Asthma control goals:  * Full participation in all desired activities (may need albuterol before activity) * Albuterol use two time or less a week on average (not counting use with activity) * Cough interfering with sleep two time or less a month * Oral steroids no more than once a year * No hospitalizations  3. OSA (obstructive sleep apnea) - Continue with CPAP nightly.  4. Return in about 6 months (around 05/14/2017).  Please inform us of any Emergency Department visits, hospitalizations, or changes in symptoms. Call us before going to the ED for breathing or allergy symptoms since we might be able to fit you in for a sick visit. Feel free to contact us anytime with any questions, problems, or concerns.  It was a pleasure to see you again today! Happy spring!   Websites that have reliable patient information: 1. American Academy of Asthma, Allergy, and Immunology: www.aaaai.org 2. Food Allergy Research and Education (FARE): foodallergy.org 3. Mothers of Asthmatics: http://www.asthmacommunitynetwork.org 4. American College of Allergy, Asthma, and Immunology: www.acaai.org

## 2016-11-12 NOTE — Progress Notes (Signed)
Mr. Lamoine returns for allergy testing following his appointment this morning. Results charted in the previous encounter from earlier today.   Salvatore Marvel, MD [facility] [place]

## 2016-11-12 NOTE — Patient Instructions (Addendum)
1. Perennial allergic rhinitis - Testing today showed: weeds, molds, dust mite, cat, and cockroach. - Avoidance measures provided.  - If you start to have worsening symptoms, we can restart your allergy shots. - The skin testing will allow [place] to have that information to mix the shots.  - Continue with nasal saline lavage twice daily. - Continue with Nasacort one spray per nostril twice daily. - Continue with Astelin nasal spray one spray per nostril twice daily.   2. Moderate persistent asthma, uncomplicated - Lung testing looked great today. - Daily controller medication(s): Advair 250/50 one inhalation once daily - Rescue medications: ProAir 4 puffs every 4-6 hours as needed - Asthma control goals:  * Full participation in all desired activities (may need albuterol before activity) * Albuterol use two time or less a week on average (not counting use with activity) * Cough interfering with sleep two time or less a month * Oral steroids no more than once a year * No hospitalizations  3. OSA (obstructive sleep apnea) - Continue with CPAP nightly.  4. Return in about 6 months (around 05/14/2017).  Please inform us of any Emergency Department visits, hospitalizations, or changes in symptoms. Call us before going to the ED for breathing or allergy symptoms since we might be able to fit you in for a sick visit. Feel free to contact us anytime with any questions, problems, or concerns.  It was a pleasure to see you again today! Happy spring!   Websites that have reliable patient information: 1. American Academy of Asthma, Allergy, and Immunology: www.aaaai.org 2. Food Allergy Research and Education (FARE): foodallergy.org 3. Mothers of Asthmatics: http://www.asthmacommunitynetwork.org 4. American College of Allergy, Asthma, and Immunology: www.acaai.org  Reducing Pollen Exposure  The American Academy of Allergy, Asthma and Immunology suggests the following steps to reduce your exposure  to pollen during allergy seasons.    1. Do not hang sheets or clothing out to dry; pollen may collect on these items. 2. Do not mow lawns or spend time around freshly cut grass; mowing stirs up pollen. 3. Keep windows closed at night.  Keep car windows closed while driving. 4. Minimize morning activities outdoors, a time when pollen counts are usually at their highest. 5. Stay indoors as much as possible when pollen counts or humidity is high and on windy days when pollen tends to remain in the air longer. 6. Use air conditioning when possible.  Many air conditioners have filters that trap the pollen spores. 7. Use a HEPA room air filter to remove pollen form the indoor air you breathe.  Control of Mold Allergen  Mold and fungi can grow on a variety of surfaces provided certain temperature and moisture conditions exist.  Outdoor molds grow on plants, decaying vegetation and soil.  The major outdoor mold, Alternaria and Cladosporium, are found in very high numbers during hot and dry conditions.  Generally, a late Summer - Fall peak is seen for common outdoor fungal spores.  Rain will temporarily lower outdoor mold spore count, but counts rise rapidly when the rainy period ends.  The most important indoor molds are Aspergillus and Penicillium.  Dark, humid and poorly ventilated basements are ideal sites for mold growth.  The next most common sites of mold growth are the bathroom and the kitchen.  Outdoor [employer] 1. Use air conditioning and keep windows closed 2. Avoid exposure to decaying vegetation. 3. Avoid leaf raking. 4. Avoid grain handling. 5. Consider wearing a face mask if working in  moldy areas.  Indoor Mold Control 1. Maintain humidity below 50%. 2. Clean washable surfaces with 5% bleach solution. 3. Remove sources e.g. contaminated carpets.  Control of Dog or Cat Allergen  Avoidance is the best way to manage a dog or cat allergy. If you have a dog or cat and are allergic to  dog or cats, consider removing the dog or cat from the home. If you have a dog or cat but don't want to find it a new home, or if your family wants a pet even though someone in the household is allergic, here are some strategies that may help keep symptoms at bay:  1. Keep the pet out of your bedroom and restrict it to only a few rooms. Be advised that keeping the dog or cat in only one room will not limit the allergens to that room. 2. Don't pet, hug or kiss the dog or cat; if you do, wash your hands with soap and water. 3. High-efficiency particulate air (HEPA) cleaners run continuously in a bedroom or living room can reduce allergen levels over time. 4. Regular use of a high-efficiency vacuum cleaner or a central vacuum can reduce allergen levels. 5. Giving your dog or cat a bath at least once a week can reduce airborne allergen.  Control of House Dust Mite Allergen    House dust mites play a major role in allergic asthma and rhinitis.  They occur in environments with high humidity wherever human skin, the food for dust mites is found. High levels have been detected in dust obtained from mattresses, pillows, carpets, upholstered furniture, bed covers, clothes and soft toys.  The principal allergen of the house dust mite is found in its feces.  A gram of dust may contain 1,000 mites and 250,000 fecal particles.  Mite antigen is easily measured in the air during house cleaning activities.    1. Encase mattresses, including the box spring, and pillow, in an air tight cover.  Seal the zipper end of the encased mattresses with wide adhesive tape. 2. Wash the bedding in water of 130 degrees Farenheit weekly.  Avoid cotton comforters/quilts and flannel bedding: the most ideal bed covering is the dacron comforter. 3. Remove all upholstered furniture from the bedroom. 4. Remove carpets, carpet padding, rugs, and non-washable window drapes from the bedroom.  Wash drapes weekly or use plastic window  coverings. 5. Remove all non-washable stuffed toys from the bedroom.  Wash stuffed toys weekly. 6. Have the room cleaned frequently with a vacuum cleaner and a damp dust-mop.  The patient should not be in a room which is being cleaned and should wait 1 hour after cleaning before going into the room. 7. Close and seal all heating outlets in the bedroom.  Otherwise, the room will become filled with dust-laden air.  An electric heater can be used to heat the room. 8. Reduce indoor humidity to less than 50%.  Do not use a humidifier.   Control of Cockroach Allergen  Cockroach allergen has been identified as an important cause of acute attacks of asthma, especially in urban settings.  There are fifty-five species of cockroach that exist in the [place], however only three, the [place], [place] species produce allergen that can affect patients with Asthma.  Allergens can be obtained from fecal particles, egg casings and secretions from cockroaches.    1. Remove food sources. 2. Reduce access to water. 3. Seal access and entry points. 4. Spray runways with 0.5-1% Diazinon  or Chlorpyrifos 5. Blow boric acid power under stoves and refrigerator. 6. Place bait stations (hydramethylnon) at feeding sites.

## 2016-11-13 DIAGNOSIS — E291 Testicular hypofunction: Secondary | ICD-10-CM | POA: Diagnosis not present

## 2016-11-27 DIAGNOSIS — E291 Testicular hypofunction: Secondary | ICD-10-CM | POA: Diagnosis not present

## 2016-12-11 DIAGNOSIS — E291 Testicular hypofunction: Secondary | ICD-10-CM | POA: Diagnosis not present

## 2016-12-30 DIAGNOSIS — E291 Testicular hypofunction: Secondary | ICD-10-CM | POA: Diagnosis not present

## 2017-01-13 DIAGNOSIS — E291 Testicular hypofunction: Secondary | ICD-10-CM | POA: Diagnosis not present

## 2017-01-27 DIAGNOSIS — E291 Testicular hypofunction: Secondary | ICD-10-CM | POA: Diagnosis not present

## 2017-02-08 DIAGNOSIS — L738 Other specified follicular disorders: Secondary | ICD-10-CM | POA: Diagnosis not present

## 2017-02-08 DIAGNOSIS — L0101 Non-bullous impetigo: Secondary | ICD-10-CM | POA: Diagnosis not present

## 2017-02-08 DIAGNOSIS — L821 Other seborrheic keratosis: Secondary | ICD-10-CM | POA: Diagnosis not present

## 2017-02-09 DIAGNOSIS — E291 Testicular hypofunction: Secondary | ICD-10-CM | POA: Diagnosis not present

## 2017-02-23 DIAGNOSIS — E291 Testicular hypofunction: Secondary | ICD-10-CM | POA: Diagnosis not present

## 2017-03-16 DIAGNOSIS — E291 Testicular hypofunction: Secondary | ICD-10-CM | POA: Diagnosis not present

## 2017-04-13 DIAGNOSIS — E291 Testicular hypofunction: Secondary | ICD-10-CM | POA: Diagnosis not present

## 2017-04-29 DIAGNOSIS — E291 Testicular hypofunction: Secondary | ICD-10-CM | POA: Diagnosis not present

## 2017-05-06 DIAGNOSIS — E785 Hyperlipidemia, unspecified: Secondary | ICD-10-CM | POA: Diagnosis not present

## 2017-05-06 DIAGNOSIS — Z125 Encounter for screening for malignant neoplasm of prostate: Secondary | ICD-10-CM | POA: Diagnosis not present

## 2017-05-06 DIAGNOSIS — I10 Essential (primary) hypertension: Secondary | ICD-10-CM | POA: Diagnosis not present

## 2017-05-06 DIAGNOSIS — E559 Vitamin D deficiency, unspecified: Secondary | ICD-10-CM | POA: Diagnosis not present

## 2017-05-06 DIAGNOSIS — I129 Hypertensive chronic kidney disease with stage 1 through stage 4 chronic kidney disease, or unspecified chronic kidney disease: Secondary | ICD-10-CM | POA: Diagnosis not present

## 2017-05-13 DIAGNOSIS — I10 Essential (primary) hypertension: Secondary | ICD-10-CM | POA: Diagnosis not present

## 2017-05-13 DIAGNOSIS — J45909 Unspecified asthma, uncomplicated: Secondary | ICD-10-CM | POA: Diagnosis not present

## 2017-05-13 DIAGNOSIS — R9439 Abnormal result of other cardiovascular function study: Secondary | ICD-10-CM | POA: Diagnosis not present

## 2017-05-13 DIAGNOSIS — G4733 Obstructive sleep apnea (adult) (pediatric): Secondary | ICD-10-CM | POA: Diagnosis not present

## 2017-05-13 DIAGNOSIS — L570 Actinic keratosis: Secondary | ICD-10-CM | POA: Diagnosis not present

## 2017-05-13 DIAGNOSIS — E291 Testicular hypofunction: Secondary | ICD-10-CM | POA: Diagnosis not present

## 2017-05-13 DIAGNOSIS — E785 Hyperlipidemia, unspecified: Secondary | ICD-10-CM | POA: Diagnosis not present

## 2017-05-13 DIAGNOSIS — I129 Hypertensive chronic kidney disease with stage 1 through stage 4 chronic kidney disease, or unspecified chronic kidney disease: Secondary | ICD-10-CM | POA: Diagnosis not present

## 2017-05-13 DIAGNOSIS — E559 Vitamin D deficiency, unspecified: Secondary | ICD-10-CM | POA: Diagnosis not present

## 2017-05-13 DIAGNOSIS — J309 Allergic rhinitis, unspecified: Secondary | ICD-10-CM | POA: Diagnosis not present

## 2017-05-13 DIAGNOSIS — N183 Chronic kidney disease, stage 3 (moderate): Secondary | ICD-10-CM | POA: Diagnosis not present

## 2017-05-13 DIAGNOSIS — Z789 Other specified health status: Secondary | ICD-10-CM | POA: Diagnosis not present

## 2017-05-18 ENCOUNTER — Ambulatory Visit (INDEPENDENT_AMBULATORY_CARE_PROVIDER_SITE_OTHER): Payer: Medicare Other | Admitting: Cardiology

## 2017-05-18 VITALS — BP 132/60 | HR 61 | Ht 68.5 in | Wt 281.0 lb

## 2017-05-18 DIAGNOSIS — Z01810 Encounter for preprocedural cardiovascular examination: Secondary | ICD-10-CM | POA: Diagnosis not present

## 2017-05-18 DIAGNOSIS — E7800 Pure hypercholesterolemia, unspecified: Secondary | ICD-10-CM

## 2017-05-18 DIAGNOSIS — I10 Essential (primary) hypertension: Secondary | ICD-10-CM

## 2017-05-18 DIAGNOSIS — R9439 Abnormal result of other cardiovascular function study: Secondary | ICD-10-CM | POA: Diagnosis not present

## 2017-05-18 NOTE — Patient Instructions (Signed)
Medication Instructions:  Your physician recommends that you continue on your current medications as directed. Please refer to the Current Medication list given to you today.   Labwork: none  Testing/Procedures: none  Follow-Up: Your physician wants you to follow-up in: 12 months with Dr. Nelson. You will receive a reminder letter in the mail two months in advance. If you don't receive a letter, please call our office to schedule the follow-up appointment.   Any Other Special Instructions Will Be Listed Below (If Applicable).     If you need a refill on your cardiac medications before your next appointment, please call your pharmacy.   

## 2017-05-18 NOTE — Progress Notes (Signed)
Cardiology Office Note:    Date:  05/18/2017   ID:  Erik Wiggins, DOB 07-Jul-1946, MRN [id number]  PCP:  Thressa Sheller, MD  Cardiologist:  Ena Dawley, MD   Referring MD: Thressa Sheller, MD   New patient, reason for visit: abnormal stress test  History of Present Illness:    Erik Wiggins is a 71 y.o. male with a hx of hypertension, hyperlipidemia, morbid obesity, who is coming with concern of stress test.  The patient underwent cardiac catheterization 11 years ago that was completely normal.  A year ago he was referred for an exercise treadmill stress test that was abnormal T waves in the inferolateral leads but no ST depressions.  As a consequence he underwent an exercise nuclear stress test in our office in July 2017 that showed no prior infarct and no ischemia and LVEF of 64%.  Since then he has been fairly active able to perform all activities of daily living including yard work with some mild dyspnea on moderate exertion but no chest pain. To enter research weight loss study at [facility] that requires stress test prior to inclusion.  He again had abnormal exercise treadmill stress test.  He is coming for clearance.  Past Medical History:  Diagnosis Date  . Allergic rhinitis   . Asthma   . Asthma, mild intermittent, well-controlled 12/22/2014  . High cholesterol   . Hypertension   . Low testosterone   . Moderate persistent asthma, uncomplicated 5/73/2202  . Nasal polyps 06/21/2014   Demonstrated on previous sinus CT by report. No history of aspirin intolerance   . Obesity 06/21/2014  . OSA (obstructive sleep apnea)   . Perennial allergic rhinitis 06/21/2014   Allergy vaccine started 2012 in [place]    Past Surgical History:  Procedure Laterality Date  . CARDIAC CATHETERIZATION    . COLONSCOPY     Current Medications: Current Meds  Medication Sig  . albuterol (PROAIR HFA) 108 (90 Base) MCG/ACT inhaler Inhale 4 puffs into the lungs every 4 (four) hours as  needed for wheezing or shortness of breath. 2 puffs every 6 hrs as needed  . azelastine (ASTELIN) 0.1 % nasal spray Place 1 spray into both nostrils 2 (two) times daily. Use in each nostril as directed  . cetirizine (ZYRTEC) 10 MG tablet Take 10 mg by mouth daily.  . Cholecalciferol (VITAMIN D) 2000 units CAPS Take 1 capsule by mouth daily.  . Fluticasone-Salmeterol (AIRDUO RESPICLICK 542/70) 623-76 MCG/ACT AEPB Inhale 1 puff into the lungs 2 (two) times daily.  . indapamide (LOZOL) 2.5 MG tablet Once dailt  . ipratropium (ATROVENT) 0.03 % nasal spray As needed  . montelukast (SINGULAIR) 10 MG tablet Once daily  . NONFORMULARY OR COMPOUNDED ITEM Allergy Vaccine 1:2 Given at [facility]  . sertraline (ZOLOFT) 50 MG tablet   . simvastatin (ZOCOR) 40 MG tablet Once daily  . triamcinolone (NASACORT ALLERGY 24HR) 55 MCG/ACT AERO nasal inhaler Place 1 spray into the nose 2 (two) times daily.  . [DISCONTINUED] aspirin 81 MG tablet Take 81 mg by mouth daily.  . [DISCONTINUED] testosterone cypionate (DEPOTESTOTERONE CYPIONATE) 100 MG/ML injection   . [DISCONTINUED] Vitamin D, Ergocalciferol, (DRISDOL) 50000 units CAPS capsule Take 50,000 Units by mouth once a week.     Allergies:   Lisinopril and Sulfa antibiotics   Social History   Social History  . Marital status: Married    Spouse name: N/A  . Number of children: 3  . Years of education: N/A  Occupational History  . retired-warehouse supervisor    Social History Main Topics  . Smoking status: Former Smoker    Years: 1.00    Types: Cigars    Quit date: 07/21/1995  . Smokeless tobacco: Never Used  . Alcohol use No  . Drug use: No  . Sexual activity: Not Asked     Comment: MARRIED   Other Topics Concern  . None   Social History Narrative  . None     Family History: The patient's family history includes AAA (abdominal aortic aneurysm) in his father; Cancer in his maternal grandfather; Healthy in his mother, son, son, and  son; Heart attack (age of onset: 67) in his father; Multiple sclerosis in his son. There is no history of Allergic rhinitis, Angioedema, Asthma, Atopy, Eczema, Immunodeficiency, or Urticaria. ROS:   Please see the history of present illness.     All other systems reviewed and are negative.  EKGs/Labs/Other Studies Reviewed:    The following studies were reviewed today:  EKG:  EKG is not ordered today.  The ekg performed during the stress test is reviewed and shows normal sinus rhythm normal EKG unchanged from prior.  This was personally reviewed.  Recent Labs: No results found for requested labs within last 8760 hours.  Recent Lipid Panel No results found for: CHOL, TRIG, HDL, CHOLHDL, VLDL, LDLCALC, LDLDIRECT  Physical Exam:    VS:  BP 132/60   Pulse 61   Ht 5' 8.5" (1.74 m)   Wt 281 lb (127.5 kg)   SpO2 98%   BMI 42.10 kg/m     Wt Readings from Last 3 Encounters:  05/18/17 281 lb (127.5 kg)  11/12/16 286 lb 9.6 oz (130 kg)  08/12/16 (!) 303 lb 4.8 oz (137.6 kg)     GEN:  Well nourished, well developed in no acute distress, morbidly obese HEENT: Normal NECK: No JVD; No carotid bruits LYMPHATICS: No lymphadenopathy CARDIAC: RRR, no murmurs, rubs, gallops RESPIRATORY:  Clear to auscultation without rales, wheezing or rhonchi  ABDOMEN: Soft, non-tender, non-distended MUSCULOSKELETAL:  No edema; No deformity  SKIN: Warm and dry NEUROLOGIC:  Alert and oriented x 3 PSYCHIATRIC:  Normal affect   ASSESSMENT:    1. Abnormal stress test   2. Preoperative cardiovascular examination   3. Essential hypertension   4. Pure hypercholesterolemia   5. Morbid obesity ([place])    PLAN:    In order of problems listed above:  1. With regards to clearance for research study I wrote the patient a letter:  To whom it may concern,  Mr Erik Wiggins was seen in my office today for concern of an abnormal exercise treadmill stress test.  The patient underwent an exercise nuclear stress  test in July 2017 in our office because of the same reason of an abnormal exercise treadmill stress test.  The EKG portion was abnormal with negative T waves in the inferolateral leads however no ST depressions.  Perfusion images were all normal consistent with no prior infarct and no ischemia.  Patient also had normal left ventricular ejection fraction 64%. The patient has had no change in symptoms in the last year and is able to perform or activities of daily living including yard work without any chest pain and no significant dyspnea on exertion. I do not think there is any reason to repeat his stress test just 1 year after normal nuclear stress test since his symptoms have not changed. He should be able to start research study that  includes exercise.  2.  Hypertension -well-controlled continue the same management  3.  Hyperlipidemia -continue simvastatin 40 that he is tolerating well  4.  Obesity -he is encouraged to enter to study, if they still require stress test we will order one or perform coronary CTA.    Medication Adjustments/Labs and Tests Ordered: Current medicines are reviewed at length with the patient today.  Concerns regarding medicines are outlined above.  No orders of the defined types were placed in this encounter.  No orders of the defined types were placed in this encounter.   Signed, Ena Dawley, MD  05/18/2017 2:59 PM    [place]

## 2017-05-20 DIAGNOSIS — E291 Testicular hypofunction: Secondary | ICD-10-CM | POA: Diagnosis not present

## 2017-05-25 DIAGNOSIS — E559 Vitamin D deficiency, unspecified: Secondary | ICD-10-CM | POA: Diagnosis not present

## 2017-05-25 DIAGNOSIS — J45909 Unspecified asthma, uncomplicated: Secondary | ICD-10-CM | POA: Diagnosis not present

## 2017-05-25 DIAGNOSIS — E291 Testicular hypofunction: Secondary | ICD-10-CM | POA: Diagnosis not present

## 2017-05-25 DIAGNOSIS — F3341 Major depressive disorder, recurrent, in partial remission: Secondary | ICD-10-CM | POA: Diagnosis not present

## 2017-06-04 DIAGNOSIS — Z23 Encounter for immunization: Secondary | ICD-10-CM | POA: Diagnosis not present

## 2017-06-16 DIAGNOSIS — I129 Hypertensive chronic kidney disease with stage 1 through stage 4 chronic kidney disease, or unspecified chronic kidney disease: Secondary | ICD-10-CM | POA: Diagnosis not present

## 2017-06-16 DIAGNOSIS — E291 Testicular hypofunction: Secondary | ICD-10-CM | POA: Diagnosis not present

## 2017-06-16 DIAGNOSIS — F3341 Major depressive disorder, recurrent, in partial remission: Secondary | ICD-10-CM | POA: Diagnosis not present

## 2017-07-30 DIAGNOSIS — E291 Testicular hypofunction: Secondary | ICD-10-CM | POA: Diagnosis not present

## 2017-08-02 DIAGNOSIS — D3192 Benign neoplasm of unspecified part of left eye: Secondary | ICD-10-CM | POA: Diagnosis not present

## 2017-08-02 DIAGNOSIS — Z961 Presence of intraocular lens: Secondary | ICD-10-CM | POA: Diagnosis not present

## 2017-08-02 DIAGNOSIS — H35373 Puckering of macula, bilateral: Secondary | ICD-10-CM | POA: Diagnosis not present

## 2017-08-02 DIAGNOSIS — H04123 Dry eye syndrome of bilateral lacrimal glands: Secondary | ICD-10-CM | POA: Diagnosis not present

## 2017-08-06 DIAGNOSIS — E291 Testicular hypofunction: Secondary | ICD-10-CM | POA: Diagnosis not present

## 2017-08-06 DIAGNOSIS — J45909 Unspecified asthma, uncomplicated: Secondary | ICD-10-CM | POA: Diagnosis not present

## 2017-09-27 DIAGNOSIS — E291 Testicular hypofunction: Secondary | ICD-10-CM | POA: Diagnosis not present

## 2017-10-04 DIAGNOSIS — E291 Testicular hypofunction: Secondary | ICD-10-CM | POA: Diagnosis not present

## 2017-11-26 DIAGNOSIS — G4733 Obstructive sleep apnea (adult) (pediatric): Secondary | ICD-10-CM | POA: Diagnosis not present

## 2017-11-26 DIAGNOSIS — Z9989 Dependence on other enabling machines and devices: Secondary | ICD-10-CM | POA: Diagnosis not present

## 2017-12-22 DIAGNOSIS — E291 Testicular hypofunction: Secondary | ICD-10-CM | POA: Diagnosis not present

## 2017-12-27 DIAGNOSIS — I10 Essential (primary) hypertension: Secondary | ICD-10-CM | POA: Diagnosis not present

## 2017-12-27 DIAGNOSIS — E291 Testicular hypofunction: Secondary | ICD-10-CM | POA: Diagnosis not present

## 2018-01-06 DIAGNOSIS — K429 Umbilical hernia without obstruction or gangrene: Secondary | ICD-10-CM | POA: Diagnosis not present

## 2018-01-06 DIAGNOSIS — Z91040 Latex allergy status: Secondary | ICD-10-CM | POA: Diagnosis not present

## 2018-01-06 DIAGNOSIS — M7740 Metatarsalgia, unspecified foot: Secondary | ICD-10-CM | POA: Diagnosis not present

## 2018-01-06 DIAGNOSIS — B351 Tinea unguium: Secondary | ICD-10-CM | POA: Diagnosis not present

## 2018-01-17 DIAGNOSIS — E559 Vitamin D deficiency, unspecified: Secondary | ICD-10-CM | POA: Diagnosis not present

## 2018-01-17 DIAGNOSIS — Z79899 Other long term (current) drug therapy: Secondary | ICD-10-CM | POA: Diagnosis not present

## 2018-02-02 ENCOUNTER — Ambulatory Visit: Payer: Self-pay | Admitting: Surgery

## 2018-02-02 DIAGNOSIS — K429 Umbilical hernia without obstruction or gangrene: Secondary | ICD-10-CM | POA: Diagnosis not present

## 2018-02-07 DIAGNOSIS — Z6839 Body mass index (BMI) 39.0-39.9, adult: Secondary | ICD-10-CM | POA: Diagnosis not present

## 2018-02-07 DIAGNOSIS — K429 Umbilical hernia without obstruction or gangrene: Secondary | ICD-10-CM | POA: Diagnosis not present

## 2018-02-07 DIAGNOSIS — B351 Tinea unguium: Secondary | ICD-10-CM | POA: Diagnosis not present

## 2018-02-07 DIAGNOSIS — M7740 Metatarsalgia, unspecified foot: Secondary | ICD-10-CM | POA: Diagnosis not present

## 2018-02-07 NOTE — Patient Instructions (Addendum)
Erik Wiggins  02/07/2018   Your procedure is scheduled on: 02-11-18   Report to [facility] Main  Entrance    Report to Admitting at 5:30 AM    Call this number if you have problems the morning of surgery [phone]   Remember: Do not eat food or drink liquids :After Midnight.     Take these medicines the morning of surgery with A SIP OF WATER: You may bring and use your inhaler, nasal spray, and eyedrops as needed.                                You may not have any metal on your body including hair pins and              piercings  Do not wear jewelry, lotions, powders, cologne or deodorant             Men may shave face and neck.   Do not bring valuables to the hospital. [place].  Contacts, dentures or bridgework may not be worn into surgery.     Patients discharged the day of surgery will not be allowed to drive home.  Name and phone number of your driver:  Special Instructions: N/A              Please read over the following fact sheets you were given: _____________________________________________________________________             [facility] - Preparing for Surgery Before surgery, you can play an important role.  Because skin is not sterile, your skin needs to be as free of germs as possible.  You can reduce the number of germs on your skin by washing with CHG (chlorahexidine gluconate) soap before surgery.  CHG is an antiseptic cleaner which kills germs and bonds with the skin to continue killing germs even after washing. Please DO NOT use if you have an allergy to CHG or antibacterial soaps.  If your skin becomes reddened/irritated stop using the CHG and inform your nurse when you arrive at Short Stay. Do not shave (including legs and underarms) for at least 48 hours prior to the first CHG shower.  You may shave your face/neck. Please follow these instructions carefully:  1.  Shower with  CHG Soap the night before surgery and the  morning of Surgery.  2.  If you choose to wash your hair, wash your hair first as usual with your  normal  shampoo.  3.  After you shampoo, rinse your hair and body thoroughly to remove the  shampoo.                           4.  Use CHG as you would any other liquid soap.  You can apply chg directly  to the skin and wash                       Gently with a scrungie or clean washcloth.  5.  Apply the CHG Soap to your body ONLY FROM THE NECK DOWN.   Do not use on face/ open  Wound or open sores. Avoid contact with eyes, ears mouth and genitals (private parts).                       Wash face,  Genitals (private parts) with your normal soap.             6.  Wash thoroughly, paying special attention to the area where your surgery  will be performed.  7.  Thoroughly rinse your body with warm water from the neck down.  8.  DO NOT shower/wash with your normal soap after using and rinsing off  the CHG Soap.                9.  Pat yourself dry with a clean towel.            10.  Wear clean pajamas.            11.  Place clean sheets on your bed the night of your first shower and do not  sleep with pets. Day of Surgery : Do not apply any lotions/deodorants the morning of surgery.  Please wear clean clothes to the hospital/surgery center.  FAILURE TO FOLLOW THESE INSTRUCTIONS MAY RESULT IN THE CANCELLATION OF YOUR SURGERY PATIENT SIGNATURE_________________________________  NURSE SIGNATURE__________________________________  ________________________________________________________________________

## 2018-02-07 NOTE — Progress Notes (Signed)
05-19-17 LOV w/Cardiology

## 2018-02-08 ENCOUNTER — Other Ambulatory Visit: Payer: Self-pay

## 2018-02-08 ENCOUNTER — Encounter (HOSPITAL_BASED_OUTPATIENT_CLINIC_OR_DEPARTMENT_OTHER): Payer: Self-pay | Admitting: *Deleted

## 2018-02-08 NOTE — Progress Notes (Signed)
Chart reviewed with Dr Marcie Bal, [place] for [facility], does not need anesthesia consult before surgery.

## 2018-02-09 ENCOUNTER — Encounter (HOSPITAL_BASED_OUTPATIENT_CLINIC_OR_DEPARTMENT_OTHER)
Admission: RE | Admit: 2018-02-09 | Discharge: 2018-02-09 | Disposition: A | Discharge status: Home / Self Care | Payer: Medicare Other | Source: Ambulatory Visit | Attending: Surgery | Admitting: Surgery

## 2018-02-09 ENCOUNTER — Encounter (HOSPITAL_COMMUNITY)
Admission: RE | Admit: 2018-02-09 | Discharge: 2018-02-09 | Disposition: A | Discharge status: Home / Self Care | Payer: Medicare Other | Source: Ambulatory Visit | Attending: Surgery | Admitting: Surgery

## 2018-02-09 ENCOUNTER — Encounter (HOSPITAL_BASED_OUTPATIENT_CLINIC_OR_DEPARTMENT_OTHER): Payer: Self-pay | Admitting: Surgery

## 2018-02-09 DIAGNOSIS — Z87891 Personal history of nicotine dependence: Secondary | ICD-10-CM | POA: Diagnosis not present

## 2018-02-09 DIAGNOSIS — G4730 Sleep apnea, unspecified: Secondary | ICD-10-CM | POA: Diagnosis not present

## 2018-02-09 DIAGNOSIS — Z882 Allergy status to sulfonamides status: Secondary | ICD-10-CM | POA: Diagnosis not present

## 2018-02-09 DIAGNOSIS — Z888 Allergy status to other drugs, medicaments and biological substances status: Secondary | ICD-10-CM | POA: Diagnosis not present

## 2018-02-09 DIAGNOSIS — J45909 Unspecified asthma, uncomplicated: Secondary | ICD-10-CM | POA: Diagnosis not present

## 2018-02-09 DIAGNOSIS — Z79899 Other long term (current) drug therapy: Secondary | ICD-10-CM | POA: Diagnosis not present

## 2018-02-09 DIAGNOSIS — Z9989 Dependence on other enabling machines and devices: Secondary | ICD-10-CM | POA: Diagnosis not present

## 2018-02-09 DIAGNOSIS — K429 Umbilical hernia without obstruction or gangrene: Secondary | ICD-10-CM | POA: Diagnosis not present

## 2018-02-09 DIAGNOSIS — I10 Essential (primary) hypertension: Secondary | ICD-10-CM | POA: Diagnosis not present

## 2018-02-09 DIAGNOSIS — F419 Anxiety disorder, unspecified: Secondary | ICD-10-CM | POA: Diagnosis not present

## 2018-02-09 NOTE — H&P (Signed)
General Surgery [facility]  [name] DOB: Jan 23, 1946 Married / Language: English / Race: White Male   History of Present Illness  The patient is a 72 year old male who presents with an umbilical hernia.  CHIEF COMPLAINT: umbilical hernia  Patient is referred by Dr. Deland Pretty for surgical evaluation and management of umbilical hernia. Patient states that the umbilical hernia occurred approximately 3-4 years ago while he was lifting a sleeper sofa. The hernia has gradually become larger. It causes intermittent mild discomfort. It is always been reducible. Patient has had no prior abdominal surgery. He has had no prior hernia repairs. He is on a weight loss program through [facility] and has lost approximately 30 pounds in the last 6-8 months. He is walking about 5 miles per day. He does use BiPAP and CPAP at home. Patient is now referred for evaluation and management of umbilical hernia.   Past Surgical History  Cataract Surgery  Bilateral. Colon Polyp Removal - Colonoscopy  Colon Polyp Removal - Open   Diagnostic Studies History Colonoscopy  5-10 years ago  Allergies Lisinopril *ANTIHYPERTENSIVES*  Sulfa Antibiotics  Allergies Reconciled   Medication History Simvastatin (40MG  Tablet, Oral) Active. Sertraline HCl (50MG  Tablet, Oral) Active. Montelukast Sodium (10MG  Tablet, Oral) Active. Ipratropium Bromide (0.03% Solution, Nasal) Active. Indapamide (2.5MG  Tablet, Oral) Active. Testosterone Cypionate (200MG /ML Solution, Intramuscular) Active. Medications Reconciled  Social History Alcohol use  Remotely quit alcohol use. Caffeine use  Coffee, Tea. No drug use  Tobacco use  Never smoker.  Family History Arthritis  Father. Hypertension  Father, Son. Seizure disorder  Son. Thyroid problems  Family Members In General.  Other Problems Anxiety Disorder  Asthma  Hemorrhoids  High blood pressure  Sleep  Apnea  Umbilical Hernia Repair   Review of Systems General Present- Weight Loss. Not Present- Appetite Loss, Chills, Fatigue, Fever, Night Sweats and Weight Gain. Skin Present- Dryness and New Lesions. Not Present- Change in Wart/Mole, Hives, Jaundice, Non-Healing Wounds, Rash and Ulcer. HEENT Present- Ringing in the Ears. Not Present- Earache, Hearing Loss, Hoarseness, Nose Bleed, Oral Ulcers, Seasonal Allergies, Sinus Pain, Sore Throat, Visual Disturbances, Wears glasses/contact lenses and Yellow Eyes. Respiratory Present- Snoring. Not Present- Bloody sputum, Chronic Cough, Difficulty Breathing and Wheezing. Cardiovascular Present- Difficulty Breathing Lying Down. Not Present- Chest Pain, Leg Cramps, Palpitations, Rapid Heart Rate, Shortness of Breath and Swelling of Extremities. Gastrointestinal Present- Hemorrhoids. Not Present- Abdominal Pain, Bloating, Bloody Stool, Change in Bowel Habits, Chronic diarrhea, Constipation, Difficulty Swallowing, Excessive gas, Gets full quickly at meals, Indigestion, Nausea, Rectal Pain and Vomiting. Male Genitourinary Present- Frequency and Impotence. Not Present- Blood in Urine, Change in Urinary Stream, Nocturia, Painful Urination, Urgency and Urine Leakage.  Vitals Weight: 268.4 lb Height: 70in Body Surface Area: 2.37 m Body Mass Index: 38.51 kg/m  Temp.: 98.59F  Pulse: 69 (Regular)  BP: 124/82 (Sitting, Left Arm, Standard)  Physical Exam  See vital signs recorded above  GENERAL APPEARANCE Development: normal Nutritional status: normal Gross deformities: none  SKIN Rash, lesions, ulcers: none Induration, erythema: none Nodules: none palpable  EYES Conjunctiva and lids: normal Pupils: equal and reactive Iris: normal bilaterally  EARS, NOSE, MOUTH, THROAT External ears: no lesion or deformity External nose: no lesion or deformity Hearing: grossly normal Lips: no lesion or deformity Dentition: normal for age Oral  mucosa: moist  NECK Symmetric: yes Trachea: midline Thyroid: no palpable nodules in the thyroid bed  CHEST Respiratory effort: normal Retraction or accessory muscle use: no Breath  sounds: normal bilaterally Rales, rhonchi, wheeze: none  CARDIOVASCULAR Auscultation: regular rhythm, normal rate Murmurs: none Pulses: carotid and radial pulse 2+ palpable Lower extremity edema: none Lower extremity varicosities: Mild along greater saphenous vein  ABDOMEN Distension: none Masses: none palpable Tenderness: none Hepatosplenomegaly: not present Hernia: Obvious bulge at the umbilicus. Hernia is reducible. Fascial defect measures approximately 2 cm in diameter. Prolapses with Valsalva and cough.  MUSCULOSKELETAL Station and gait: normal Digits and nails: no clubbing or cyanosis Muscle strength: grossly normal all extremities Range of motion: grossly normal all extremities Deformity: none  LYMPHATIC Cervical: none palpable Supraclavicular: none palpable  PSYCHIATRIC Oriented to person, place, and time: yes Mood and affect: normal for situation Judgment and insight: appropriate for situation    Assessment & Plan  UMBILICAL HERNIA WITHOUT OBSTRUCTION OR GANGRENE (K42.9)  Pt Education - Pamphlet Given - Hernia Surgery: discussed with patient and provided information.  Patient presents on referral from his primary care physician for evaluation of umbilical hernia. Patient is provided with written literature on hernia surgery to review at home.  The patient and I reviewed the written literature and the methods of repair which are available for umbilical hernia repair. I have recommended repair with mesh patch as an open procedure. This can be performed as an outpatient. Given his pulmonary status, I would recommend doing this as an outpatient at the main hospital. We discussed the location of the surgical incision, the use of mesh, the restrictions on his activities after  surgery, and his return to normal activities. He understands and wishes to proceed with surgery in the near future.  The risks and benefits of the procedure have been discussed at length with the patient. The patient understands the proposed procedure, potential alternative treatments, and the course of recovery to be expected. All of the patient's questions have been answered at this time. The patient wishes to proceed with surgery.  Armandina Gemma, [place] [facility] Office: [phone]

## 2018-02-11 ENCOUNTER — Ambulatory Visit (HOSPITAL_BASED_OUTPATIENT_CLINIC_OR_DEPARTMENT_OTHER): Payer: Medicare Other | Admitting: Anesthesiology

## 2018-02-11 ENCOUNTER — Ambulatory Visit (HOSPITAL_BASED_OUTPATIENT_CLINIC_OR_DEPARTMENT_OTHER)
Admission: RE | Admit: 2018-02-11 | Discharge: 2018-02-11 | Disposition: A | Discharge status: Home / Self Care | Payer: Medicare Other | Source: Ambulatory Visit | Attending: Surgery | Admitting: Surgery

## 2018-02-11 ENCOUNTER — Other Ambulatory Visit: Payer: Self-pay

## 2018-02-11 ENCOUNTER — Encounter (HOSPITAL_BASED_OUTPATIENT_CLINIC_OR_DEPARTMENT_OTHER): Payer: Self-pay | Admitting: *Deleted

## 2018-02-11 ENCOUNTER — Encounter (HOSPITAL_BASED_OUTPATIENT_CLINIC_OR_DEPARTMENT_OTHER)
Admission: RE | Disposition: A | Discharge status: Home / Self Care | Payer: Self-pay | Source: Ambulatory Visit | Attending: Surgery

## 2018-02-11 DIAGNOSIS — I10 Essential (primary) hypertension: Secondary | ICD-10-CM | POA: Insufficient documentation

## 2018-02-11 DIAGNOSIS — Z79899 Other long term (current) drug therapy: Secondary | ICD-10-CM | POA: Insufficient documentation

## 2018-02-11 DIAGNOSIS — Z87891 Personal history of nicotine dependence: Secondary | ICD-10-CM | POA: Insufficient documentation

## 2018-02-11 DIAGNOSIS — J4520 Mild intermittent asthma, uncomplicated: Secondary | ICD-10-CM | POA: Diagnosis not present

## 2018-02-11 DIAGNOSIS — J45909 Unspecified asthma, uncomplicated: Secondary | ICD-10-CM | POA: Diagnosis not present

## 2018-02-11 DIAGNOSIS — F419 Anxiety disorder, unspecified: Secondary | ICD-10-CM | POA: Diagnosis not present

## 2018-02-11 DIAGNOSIS — K429 Umbilical hernia without obstruction or gangrene: Secondary | ICD-10-CM | POA: Diagnosis not present

## 2018-02-11 DIAGNOSIS — Z882 Allergy status to sulfonamides status: Secondary | ICD-10-CM | POA: Insufficient documentation

## 2018-02-11 DIAGNOSIS — Z9989 Dependence on other enabling machines and devices: Secondary | ICD-10-CM | POA: Diagnosis not present

## 2018-02-11 DIAGNOSIS — G4730 Sleep apnea, unspecified: Secondary | ICD-10-CM | POA: Insufficient documentation

## 2018-02-11 DIAGNOSIS — Z888 Allergy status to other drugs, medicaments and biological substances status: Secondary | ICD-10-CM | POA: Insufficient documentation

## 2018-02-11 DIAGNOSIS — G4733 Obstructive sleep apnea (adult) (pediatric): Secondary | ICD-10-CM | POA: Diagnosis not present

## 2018-02-11 HISTORY — PX: INSERTION OF MESH: SHX5868

## 2018-02-11 HISTORY — DX: Umbilical hernia without obstruction or gangrene: K42.9

## 2018-02-11 HISTORY — PX: UMBILICAL HERNIA REPAIR: SHX196

## 2018-02-11 SURGERY — REPAIR, HERNIA, UMBILICAL, ADULT
Anesthesia: General

## 2018-02-11 MED ORDER — FENTANYL CITRATE (PF) 100 MCG/2ML IJ SOLN
INTRAMUSCULAR | Status: AC
  Filled 2018-02-11: qty 2

## 2018-02-11 MED ORDER — DEXAMETHASONE SODIUM PHOSPHATE 10 MG/ML IJ SOLN
INTRAMUSCULAR | Status: AC
  Filled 2018-02-11: qty 1

## 2018-02-11 MED ORDER — ONDANSETRON HCL 4 MG/2ML IJ SOLN
INTRAMUSCULAR | Status: DC | PRN
  Administered 2018-02-11: 4 mg via INTRAVENOUS

## 2018-02-11 MED ORDER — DEXAMETHASONE SODIUM PHOSPHATE 4 MG/ML IJ SOLN
INTRAMUSCULAR | Status: DC | PRN
  Administered 2018-02-11: 10 mg via INTRAVENOUS

## 2018-02-11 MED ORDER — MEPERIDINE HCL 25 MG/ML IJ SOLN: 6.2500 mg | INTRAMUSCULAR | Status: DC | PRN

## 2018-02-11 MED ORDER — FENTANYL CITRATE (PF) 100 MCG/2ML IJ SOLN: 25.0000 ug | INTRAMUSCULAR | Status: DC | PRN

## 2018-02-11 MED ORDER — PROPOFOL 10 MG/ML IV BOLUS
INTRAVENOUS | Status: DC | PRN
  Administered 2018-02-11: 200 mg via INTRAVENOUS

## 2018-02-11 MED ORDER — DEXTROSE 5 % IV SOLN
3.0000 g | INTRAVENOUS | Status: AC
  Administered 2018-02-11: 3 g via INTRAVENOUS

## 2018-02-11 MED ORDER — CEFAZOLIN SODIUM-DEXTROSE 2-4 GM/100ML-% IV SOLN
INTRAVENOUS | Status: AC
  Filled 2018-02-11: qty 200

## 2018-02-11 MED ORDER — BUPIVACAINE HCL (PF) 0.5 % IJ SOLN
INTRAMUSCULAR | Status: DC | PRN
  Administered 2018-02-11: 20 mL

## 2018-02-11 MED ORDER — ONDANSETRON HCL 4 MG/2ML IJ SOLN
INTRAMUSCULAR | Status: AC
Start: 2018-02-11 — End: ?
  Filled 2018-02-11: qty 2

## 2018-02-11 MED ORDER — ROCURONIUM BROMIDE 100 MG/10ML IV SOLN
INTRAVENOUS | Status: DC | PRN
  Administered 2018-02-11: 50 mg via INTRAVENOUS

## 2018-02-11 MED ORDER — SCOPOLAMINE 1 MG/3DAYS TD PT72: 1.0000 | MEDICATED_PATCH | Freq: Once | TRANSDERMAL | Status: DC | PRN

## 2018-02-11 MED ORDER — CHLORHEXIDINE GLUCONATE CLOTH 2 % EX PADS: 6.0000 | MEDICATED_PAD | Freq: Once | CUTANEOUS | Status: DC

## 2018-02-11 MED ORDER — MIDAZOLAM HCL 2 MG/2ML IJ SOLN: 1.0000 mg | INTRAMUSCULAR | Status: DC | PRN

## 2018-02-11 MED ORDER — SUGAMMADEX SODIUM 200 MG/2ML IV SOLN
INTRAVENOUS | Status: DC | PRN
  Administered 2018-02-11: 200 mg via INTRAVENOUS

## 2018-02-11 MED ORDER — FENTANYL CITRATE (PF) 100 MCG/2ML IJ SOLN
50.0000 ug | INTRAMUSCULAR | Status: DC | PRN
  Administered 2018-02-11: 50 ug via INTRAVENOUS
  Administered 2018-02-11: 100 ug via INTRAVENOUS

## 2018-02-11 MED ORDER — LACTATED RINGERS IV SOLN
INTRAVENOUS | Status: DC
  Administered 2018-02-11 (×2): via INTRAVENOUS

## 2018-02-11 MED ORDER — OXYCODONE HCL 5 MG/5ML PO SOLN: 5.0000 mg | Freq: Once | ORAL | Status: DC | PRN

## 2018-02-11 MED ORDER — LIDOCAINE HCL (CARDIAC) PF 100 MG/5ML IV SOSY
PREFILLED_SYRINGE | INTRAVENOUS | Status: DC | PRN
  Administered 2018-02-11: 100 mg via INTRAVENOUS

## 2018-02-11 MED ORDER — LIDOCAINE HCL (CARDIAC) PF 100 MG/5ML IV SOSY
PREFILLED_SYRINGE | INTRAVENOUS | Status: AC
  Filled 2018-02-11: qty 5

## 2018-02-11 MED ORDER — OXYCODONE HCL 5 MG PO TABS: 5.0000 mg | ORAL_TABLET | Freq: Once | ORAL | Status: DC | PRN

## 2018-02-11 MED ORDER — EPHEDRINE SULFATE 50 MG/ML IJ SOLN
INTRAMUSCULAR | Status: DC | PRN
  Administered 2018-02-11: 10 mg via INTRAVENOUS

## 2018-02-11 MED ORDER — TRAMADOL HCL 50 MG PO TABS: 50.0000 mg | ORAL_TABLET | Freq: Four times a day (QID) | ORAL | 0 refills | Status: DC | PRN

## 2018-02-11 SURGICAL SUPPLY — 39 items
APL SKNCLS STERI-STRIP NONHPOA (GAUZE/BANDAGES/DRESSINGS) ×1
BALL CTTN LRG ABS STRL LF (GAUZE/BANDAGES/DRESSINGS) ×1
BENZOIN TINCTURE PRP APPL 2/3 (GAUZE/BANDAGES/DRESSINGS) ×2 IMPLANT
BLADE CLIPPER SURG (BLADE) ×1 IMPLANT
BLADE SURG 15 STRL LF DISP TIS (BLADE) ×1 IMPLANT
BLADE SURG 15 STRL SS (BLADE) ×2
CHLORAPREP W/TINT 26ML (MISCELLANEOUS) ×2 IMPLANT
COTTONBALL LRG STERILE PKG (GAUZE/BANDAGES/DRESSINGS) ×2 IMPLANT
COVER BACK TABLE 60X90IN (DRAPES) ×2 IMPLANT
COVER MAYO STAND STRL (DRAPES) ×2 IMPLANT
DECANTER SPIKE VIAL GLASS SM (MISCELLANEOUS) ×1 IMPLANT
DRAPE LAPAROTOMY 100X72 PEDS (DRAPES) ×2 IMPLANT
DRAPE UTILITY XL STRL (DRAPES) ×2 IMPLANT
ELECT REM PT RETURN 9FT ADLT (ELECTROSURGICAL) ×2
ELECTRODE REM PT RTRN 9FT ADLT (ELECTROSURGICAL) ×1 IMPLANT
GAUZE SPONGE 4X4 12PLY STRL LF (GAUZE/BANDAGES/DRESSINGS) ×2 IMPLANT
GAUZE SPONGE 4X4 16PLY XRAY LF (GAUZE/BANDAGES/DRESSINGS) IMPLANT
GLOVE SURG ORTHO 8.0 STRL STRW (GLOVE) ×2 IMPLANT
GOWN STRL REUS W/ TWL LRG LVL3 (GOWN DISPOSABLE) ×1 IMPLANT
GOWN STRL REUS W/ TWL XL LVL3 (GOWN DISPOSABLE) ×1 IMPLANT
GOWN STRL REUS W/TWL LRG LVL3 (GOWN DISPOSABLE) ×2
GOWN STRL REUS W/TWL XL LVL3 (GOWN DISPOSABLE) ×2
MESH VENTRALEX ST 1-7/10 CRC S (Mesh General) ×1 IMPLANT
NDL HYPO 25X1 1.5 SAFETY (NEEDLE) ×1 IMPLANT
NEEDLE HYPO 25X1 1.5 SAFETY (NEEDLE) ×2 IMPLANT
NS IRRIG 1000ML POUR BTL (IV SOLUTION) ×2 IMPLANT
PACK BASIN DAY SURGERY FS (CUSTOM PROCEDURE TRAY) ×2 IMPLANT
PENCIL BUTTON HOLSTER BLD 10FT (ELECTRODE) ×2 IMPLANT
SLEEVE SCD COMPRESS KNEE MED (MISCELLANEOUS) ×2 IMPLANT
STRIP CLOSURE SKIN 1/2X4 (GAUZE/BANDAGES/DRESSINGS) ×2 IMPLANT
SUT ETHIBOND 0 MO6 C/R (SUTURE) IMPLANT
SUT MNCRL AB 4-0 PS2 18 (SUTURE) ×2 IMPLANT
SUT NOVA 0 T19/GS 22DT (SUTURE) ×2 IMPLANT
SUT VICRYL 3-0 CR8 SH (SUTURE) ×2 IMPLANT
SUT VICRYL 4-0 PS2 18IN ABS (SUTURE) IMPLANT
SYR BULB 3OZ (MISCELLANEOUS) IMPLANT
SYR CONTROL 10ML LL (SYRINGE) ×2 IMPLANT
TOWEL GREEN STERILE FF (TOWEL DISPOSABLE) ×4 IMPLANT
TOWEL OR NON WOVEN STRL DISP B (DISPOSABLE) ×1 IMPLANT

## 2018-02-11 NOTE — Anesthesia Postprocedure Evaluation (Signed)
Anesthesia Post Note  Patient: Erik Wiggins  Procedure(s) Performed: OPEN REPAIR UMBILICAL HERNIA (N/A ) INSERTION OF MESH (N/A )     Patient location during evaluation: PACU Anesthesia Type: General Level of consciousness: sedated and patient cooperative Pain management: pain level controlled Vital Signs Assessment: post-procedure vital signs reviewed and stable Respiratory status: spontaneous breathing Cardiovascular status: stable Anesthetic complications: no    Last Vitals:  Vitals:   02/11/18 1249 02/11/18 1330  BP: 119/76 125/79  Pulse: (!) 59 (!) 58  Resp: 14 20  Temp:  36.7 C  SpO2: 96% 98%    Last Pain:  Vitals:   02/11/18 1330  TempSrc:   PainSc: Thomas

## 2018-02-11 NOTE — Anesthesia Procedure Notes (Signed)
Procedure Name: Intubation Date/Time: 02/11/2018 11:22 AM Performed by: Maryella Shivers, CRNA Pre-anesthesia Checklist: Patient identified, Emergency Drugs available, Suction available and Patient being monitored Patient Re-evaluated:Patient Re-evaluated prior to induction Oxygen Delivery Method: Circle system utilized Preoxygenation: Pre-oxygenation with 100% oxygen Induction Type: IV induction Ventilation: Mask ventilation without difficulty Laryngoscope Size: Mac and 4 Grade View: Grade I Tube type: Oral Tube size: 8.0 mm Number of attempts: 1 Airway Equipment and Method: Stylet and Oral airway Placement Confirmation: ETT inserted through vocal cords under direct vision,  positive ETCO2 and breath sounds checked- equal and bilateral Secured at: 22 cm Tube secured with: Tape Dental Injury: Teeth and Oropharynx as per pre-operative assessment

## 2018-02-11 NOTE — Op Note (Signed)
Umbilical Hernia, Open, Procedure Note  Pre-operative Diagnosis: Umbilical hernia, reducible  Post-operative Diagnosis: same  Procedure: Open repair of reducible umbilical hernia with Ventralex ST mesh patch (4.3 cm)  Surgeon:  Earnstine Regal, MD, FACS  Anesthesia:  General  Preparation:  Chlora-prep  Estimated Blood Loss: minimal  Complications:  none  Indications:  Patient is referred by Dr. Deland Pretty for surgical evaluation and management of umbilical hernia. Patient states that the umbilical hernia occurred approximately 3-4 years ago while he was lifting a sleeper sofa. The hernia has gradually become larger. It causes intermittent mild discomfort. It is always been reducible. Patient has had no prior abdominal surgery. He has had no prior hernia repairs.   Procedure Details  The patient was brought to operating room #2 at the [facility] and placed in a supine position on the operating room table.  Following induction of general anesthesia, the patient was prepped and draped in the usual aseptic fashion.  After ascertaining that an adequate level of anesthesia been achieved, an infraumbilical incision was made transversely with a #15 blade.  Dissection was carried through the subcutaneous tissues and the hernia sac was identified.  Hernia sac was dissected off the posterior aspect of the umbilical skin and the entire sac was dissected out down to the fascial defect.  Umbilicus was completely elevated off of the abdominal wall.  Hernia sac was mobilized circumferentially and the hernia was reduced.  Preperitoneal space was developed with gentle blunt dissection.  A Ventralex ST 4.3 cm mesh patch was selected for the repair.  The mesh patch was placed into the preperitoneal space and deployed circumferentially.  It was secured to the overlying fascia along with the closure of the fascia in the midline with interrupted 0 Novafil simple sutures in a manner to  concurrently close the fascial defect.  Local field block was placed with Marcaine.  Umbilicus was re-affixed to the abdominal wall with an interrupted 0-Novofil suture.  Subcutaneous tissues were closed with interrupted 3-0 Vicryl sutures.  Skin was anesthetized with local anesthetic.  Skin edges were reapproximated with a running 4-0 Monocryl subcuticular suture.  Wound was washed and dried and Steri-Strips were applied.  Sterile dressings were applied.  The patient was awakened from anesthesia and brought to the recovery room.  The patient tolerated the procedure well.  Armandina Gemma, MD [facility] Office: [phone]

## 2018-02-11 NOTE — Anesthesia Preprocedure Evaluation (Signed)
Anesthesia Evaluation  Patient identified by MRN, date of birth, ID band Patient awake    Reviewed: Allergy & Precautions, NPO status , Patient's Chart, lab work & pertinent test results  Airway Mallampati: II  TM Distance: >3 FB Neck ROM: Full    Dental no notable dental hx.    Pulmonary asthma , sleep apnea and Continuous Positive Airway Pressure Ventilation , former smoker,    Pulmonary exam normal breath sounds clear to auscultation       Cardiovascular hypertension, Normal cardiovascular exam Rhythm:Regular Rate:Normal     Neuro/Psych negative neurological ROS  negative psych ROS   GI/Hepatic negative GI ROS, Neg liver ROS,   Endo/Other  negative endocrine ROS  Renal/GU negative Renal ROS     Musculoskeletal negative musculoskeletal ROS (+)   Abdominal   Peds  Hematology negative hematology ROS (+)   Anesthesia Other Findings Day of surgery medications reviewed with the patient.  Reproductive/Obstetrics                             Anesthesia Physical Anesthesia Plan  ASA: III  Anesthesia Plan: General   Post-op Pain Management:    Induction: Intravenous  PONV Risk Score and Plan: 4 or greater and Ondansetron, Dexamethasone and Treatment may vary due to age or medical condition  Airway Management Planned: Oral ETT  Additional Equipment:   Intra-op Plan:   Post-operative Plan: Extubation in OR  Informed Consent: I have reviewed the patients History and Physical, chart, labs and discussed the procedure including the risks, benefits and alternatives for the proposed anesthesia with the patient or authorized representative who has indicated his/her understanding and acceptance.   Dental advisory given  Plan Discussed with: CRNA  Anesthesia Plan Comments:         Anesthesia Quick Evaluation

## 2018-02-11 NOTE — Discharge Instructions (Signed)

## 2018-02-11 NOTE — Transfer of Care (Signed)
Immediate Anesthesia Transfer of Care Note  Patient: Erik Wiggins  Procedure(s) Performed: OPEN REPAIR UMBILICAL HERNIA (N/A ) INSERTION OF MESH (N/A )  Patient Location: PACU  Anesthesia Type:General  Level of Consciousness: sedated  Airway & Oxygen Therapy: Patient Spontanous Breathing and Patient connected to face mask oxygen  Post-op Assessment: Report given to RN and Post -op Vital signs reviewed and stable  Post vital signs: Reviewed and stable  Last Vitals:  Vitals Value Taken Time  BP 141/82 02/11/2018 12:23 PM  Temp    Pulse 70 02/11/2018 12:26 PM  Resp 15 02/11/2018 12:26 PM  SpO2 96 % 02/11/2018 12:26 PM  Vitals shown include unvalidated device data.  Last Pain:  Vitals:   02/11/18 0936  TempSrc: Oral  PainSc: 0-No pain         Complications: No apparent anesthesia complications

## 2018-02-11 NOTE — Interval H&P Note (Signed)
History and Physical Interval Note:  02/11/2018 11:07 AM  Erik Wiggins  has presented today for surgery, with the diagnosis of umbilical hernia.  The various methods of treatment have been discussed with the patient and family. After consideration of risks, benefits and other options for treatment, the patient has consented to    Procedure(s): OPEN REPAIR UMBILICAL HERNIA (N/A) INSERTION OF MESH (N/A) as a surgical intervention .    The patient's history has been reviewed, patient examined, no change in status, stable for surgery.  I have reviewed the patient's chart and labs.  Questions were answered to the patient's satisfaction.    Armandina Gemma, [place] [facility] Office: [place]

## 2018-02-14 ENCOUNTER — Encounter (HOSPITAL_BASED_OUTPATIENT_CLINIC_OR_DEPARTMENT_OTHER): Payer: Self-pay | Admitting: Surgery

## 2018-03-30 DIAGNOSIS — J45901 Unspecified asthma with (acute) exacerbation: Secondary | ICD-10-CM | POA: Diagnosis not present

## 2018-03-30 DIAGNOSIS — Z23 Encounter for immunization: Secondary | ICD-10-CM | POA: Diagnosis not present

## 2018-03-30 DIAGNOSIS — B351 Tinea unguium: Secondary | ICD-10-CM | POA: Diagnosis not present

## 2018-03-30 DIAGNOSIS — J45909 Unspecified asthma, uncomplicated: Secondary | ICD-10-CM | POA: Diagnosis not present

## 2018-04-06 DIAGNOSIS — J45998 Other asthma: Secondary | ICD-10-CM | POA: Diagnosis not present

## 2018-04-06 DIAGNOSIS — R05 Cough: Secondary | ICD-10-CM | POA: Diagnosis not present

## 2018-04-06 DIAGNOSIS — J40 Bronchitis, not specified as acute or chronic: Secondary | ICD-10-CM | POA: Diagnosis not present

## 2018-05-13 DIAGNOSIS — Z125 Encounter for screening for malignant neoplasm of prostate: Secondary | ICD-10-CM | POA: Diagnosis not present

## 2018-05-13 DIAGNOSIS — E785 Hyperlipidemia, unspecified: Secondary | ICD-10-CM | POA: Diagnosis not present

## 2018-05-18 DIAGNOSIS — J45909 Unspecified asthma, uncomplicated: Secondary | ICD-10-CM | POA: Diagnosis not present

## 2018-05-18 DIAGNOSIS — E559 Vitamin D deficiency, unspecified: Secondary | ICD-10-CM | POA: Diagnosis not present

## 2018-05-18 DIAGNOSIS — F3341 Major depressive disorder, recurrent, in partial remission: Secondary | ICD-10-CM | POA: Diagnosis not present

## 2018-05-18 DIAGNOSIS — N529 Male erectile dysfunction, unspecified: Secondary | ICD-10-CM | POA: Diagnosis not present

## 2018-05-18 DIAGNOSIS — E291 Testicular hypofunction: Secondary | ICD-10-CM | POA: Diagnosis not present

## 2018-05-18 DIAGNOSIS — F17291 Nicotine dependence, other tobacco product, in remission: Secondary | ICD-10-CM | POA: Diagnosis not present

## 2018-05-18 DIAGNOSIS — M8580 Other specified disorders of bone density and structure, unspecified site: Secondary | ICD-10-CM | POA: Diagnosis not present

## 2018-05-18 DIAGNOSIS — Z23 Encounter for immunization: Secondary | ICD-10-CM | POA: Diagnosis not present

## 2018-05-18 DIAGNOSIS — E785 Hyperlipidemia, unspecified: Secondary | ICD-10-CM | POA: Diagnosis not present

## 2018-05-18 DIAGNOSIS — I129 Hypertensive chronic kidney disease with stage 1 through stage 4 chronic kidney disease, or unspecified chronic kidney disease: Secondary | ICD-10-CM | POA: Diagnosis not present

## 2018-05-18 DIAGNOSIS — I10 Essential (primary) hypertension: Secondary | ICD-10-CM | POA: Diagnosis not present

## 2018-05-18 DIAGNOSIS — Z0000 Encounter for general adult medical examination without abnormal findings: Secondary | ICD-10-CM | POA: Diagnosis not present

## 2018-05-18 DIAGNOSIS — N183 Chronic kidney disease, stage 3 (moderate): Secondary | ICD-10-CM | POA: Diagnosis not present

## 2018-06-08 ENCOUNTER — Other Ambulatory Visit: Payer: Self-pay

## 2018-07-28 DIAGNOSIS — L281 Prurigo nodularis: Secondary | ICD-10-CM | POA: Diagnosis not present

## 2018-07-28 DIAGNOSIS — L821 Other seborrheic keratosis: Secondary | ICD-10-CM | POA: Diagnosis not present

## 2018-07-28 DIAGNOSIS — L570 Actinic keratosis: Secondary | ICD-10-CM | POA: Diagnosis not present

## 2018-07-28 DIAGNOSIS — L738 Other specified follicular disorders: Secondary | ICD-10-CM | POA: Diagnosis not present

## 2018-08-05 DIAGNOSIS — H35373 Puckering of macula, bilateral: Secondary | ICD-10-CM | POA: Diagnosis not present

## 2018-08-05 DIAGNOSIS — H04123 Dry eye syndrome of bilateral lacrimal glands: Secondary | ICD-10-CM | POA: Diagnosis not present

## 2018-08-18 DIAGNOSIS — D751 Secondary polycythemia: Secondary | ICD-10-CM | POA: Diagnosis not present

## 2018-11-17 DIAGNOSIS — I10 Essential (primary) hypertension: Secondary | ICD-10-CM | POA: Diagnosis not present

## 2018-11-17 DIAGNOSIS — D751 Secondary polycythemia: Secondary | ICD-10-CM | POA: Diagnosis not present

## 2018-11-24 DIAGNOSIS — J309 Allergic rhinitis, unspecified: Secondary | ICD-10-CM | POA: Diagnosis not present

## 2018-11-24 DIAGNOSIS — I10 Essential (primary) hypertension: Secondary | ICD-10-CM | POA: Diagnosis not present

## 2018-11-24 DIAGNOSIS — J45909 Unspecified asthma, uncomplicated: Secondary | ICD-10-CM | POA: Diagnosis not present

## 2018-12-08 DIAGNOSIS — H1032 Unspecified acute conjunctivitis, left eye: Secondary | ICD-10-CM | POA: Diagnosis not present

## 2018-12-28 DIAGNOSIS — J309 Allergic rhinitis, unspecified: Secondary | ICD-10-CM | POA: Diagnosis not present

## 2019-02-16 DIAGNOSIS — D751 Secondary polycythemia: Secondary | ICD-10-CM | POA: Diagnosis not present

## 2019-05-18 DIAGNOSIS — J019 Acute sinusitis, unspecified: Secondary | ICD-10-CM | POA: Diagnosis not present

## 2019-05-22 DIAGNOSIS — I129 Hypertensive chronic kidney disease with stage 1 through stage 4 chronic kidney disease, or unspecified chronic kidney disease: Secondary | ICD-10-CM | POA: Diagnosis not present

## 2019-05-22 DIAGNOSIS — E785 Hyperlipidemia, unspecified: Secondary | ICD-10-CM | POA: Diagnosis not present

## 2019-05-22 DIAGNOSIS — Z125 Encounter for screening for malignant neoplasm of prostate: Secondary | ICD-10-CM | POA: Diagnosis not present

## 2019-05-22 DIAGNOSIS — Z1159 Encounter for screening for other viral diseases: Secondary | ICD-10-CM | POA: Diagnosis not present

## 2019-05-25 DIAGNOSIS — Z1212 Encounter for screening for malignant neoplasm of rectum: Secondary | ICD-10-CM | POA: Diagnosis not present

## 2019-05-25 DIAGNOSIS — I129 Hypertensive chronic kidney disease with stage 1 through stage 4 chronic kidney disease, or unspecified chronic kidney disease: Secondary | ICD-10-CM | POA: Diagnosis not present

## 2019-05-25 DIAGNOSIS — Z23 Encounter for immunization: Secondary | ICD-10-CM | POA: Diagnosis not present

## 2019-05-25 DIAGNOSIS — J309 Allergic rhinitis, unspecified: Secondary | ICD-10-CM | POA: Diagnosis not present

## 2019-05-25 DIAGNOSIS — J45909 Unspecified asthma, uncomplicated: Secondary | ICD-10-CM | POA: Diagnosis not present

## 2019-05-25 DIAGNOSIS — Z0001 Encounter for general adult medical examination with abnormal findings: Secondary | ICD-10-CM | POA: Diagnosis not present

## 2019-05-25 DIAGNOSIS — G4733 Obstructive sleep apnea (adult) (pediatric): Secondary | ICD-10-CM | POA: Diagnosis not present

## 2019-05-25 DIAGNOSIS — E291 Testicular hypofunction: Secondary | ICD-10-CM | POA: Diagnosis not present

## 2019-05-25 DIAGNOSIS — E559 Vitamin D deficiency, unspecified: Secondary | ICD-10-CM | POA: Diagnosis not present

## 2019-05-25 DIAGNOSIS — E785 Hyperlipidemia, unspecified: Secondary | ICD-10-CM | POA: Diagnosis not present

## 2019-05-25 DIAGNOSIS — M8580 Other specified disorders of bone density and structure, unspecified site: Secondary | ICD-10-CM | POA: Diagnosis not present

## 2019-05-25 DIAGNOSIS — F3341 Major depressive disorder, recurrent, in partial remission: Secondary | ICD-10-CM | POA: Diagnosis not present

## 2019-05-25 DIAGNOSIS — I10 Essential (primary) hypertension: Secondary | ICD-10-CM | POA: Diagnosis not present

## 2019-06-30 DIAGNOSIS — G4733 Obstructive sleep apnea (adult) (pediatric): Secondary | ICD-10-CM | POA: Diagnosis not present

## 2019-08-09 DIAGNOSIS — R52 Pain, unspecified: Secondary | ICD-10-CM | POA: Diagnosis not present

## 2019-08-09 DIAGNOSIS — L299 Pruritus, unspecified: Secondary | ICD-10-CM | POA: Diagnosis not present

## 2019-08-09 DIAGNOSIS — Z7189 Other specified counseling: Secondary | ICD-10-CM | POA: Diagnosis not present

## 2019-08-09 DIAGNOSIS — R21 Rash and other nonspecific skin eruption: Secondary | ICD-10-CM | POA: Diagnosis not present

## 2019-08-19 DIAGNOSIS — Z23 Encounter for immunization: Secondary | ICD-10-CM | POA: Diagnosis not present

## 2019-09-09 DIAGNOSIS — Z23 Encounter for immunization: Secondary | ICD-10-CM | POA: Diagnosis not present

## 2019-10-25 DIAGNOSIS — H6123 Impacted cerumen, bilateral: Secondary | ICD-10-CM | POA: Diagnosis not present

## 2020-01-15 ENCOUNTER — Other Ambulatory Visit: Payer: Self-pay

## 2020-01-15 ENCOUNTER — Encounter: Payer: Self-pay | Admitting: Emergency Medicine

## 2020-01-15 ENCOUNTER — Ambulatory Visit (INDEPENDENT_AMBULATORY_CARE_PROVIDER_SITE_OTHER): Payer: Medicare Other

## 2020-01-15 ENCOUNTER — Ambulatory Visit
Admission: EM | Admit: 2020-01-15 | Discharge: 2020-01-15 | Disposition: A | Discharge status: Home / Self Care | Payer: Medicare Other | Attending: Emergency Medicine | Admitting: Emergency Medicine

## 2020-01-15 DIAGNOSIS — J42 Unspecified chronic bronchitis: Secondary | ICD-10-CM | POA: Diagnosis not present

## 2020-01-15 DIAGNOSIS — R0981 Nasal congestion: Secondary | ICD-10-CM

## 2020-01-15 DIAGNOSIS — R05 Cough: Secondary | ICD-10-CM | POA: Diagnosis not present

## 2020-01-15 LAB — POC SARS CORONAVIRUS 2 AG -  ED: SARS Coronavirus 2 Ag: NEGATIVE

## 2020-01-15 IMAGING — DX DG CHEST 2V
2 series · 2 of 2 positions shown · non-contrast
Comparison: None.

CLINICAL DATA: Dry cough, nasal congestion

EXAM:
CHEST - 2 VIEW

[chest pa]
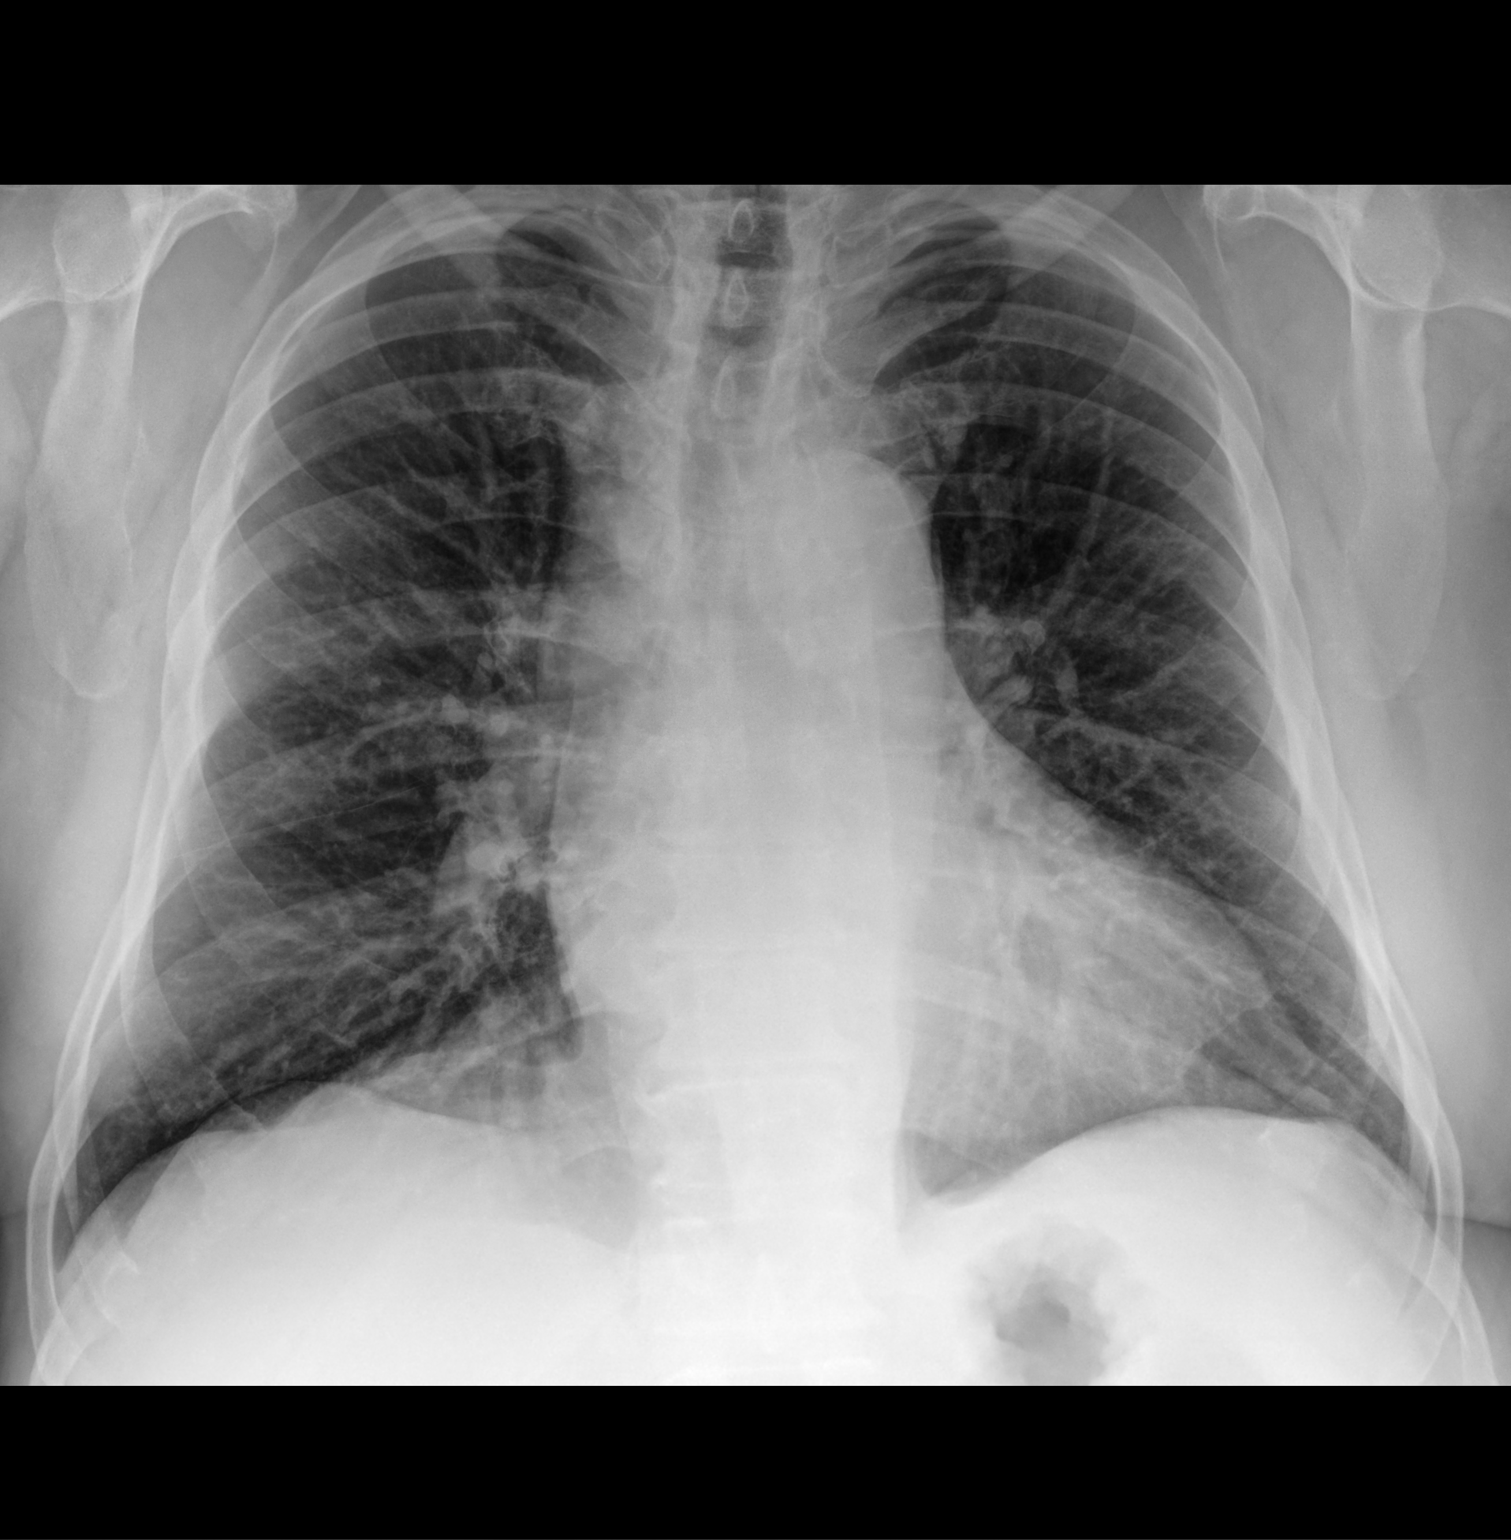

[chest lat]
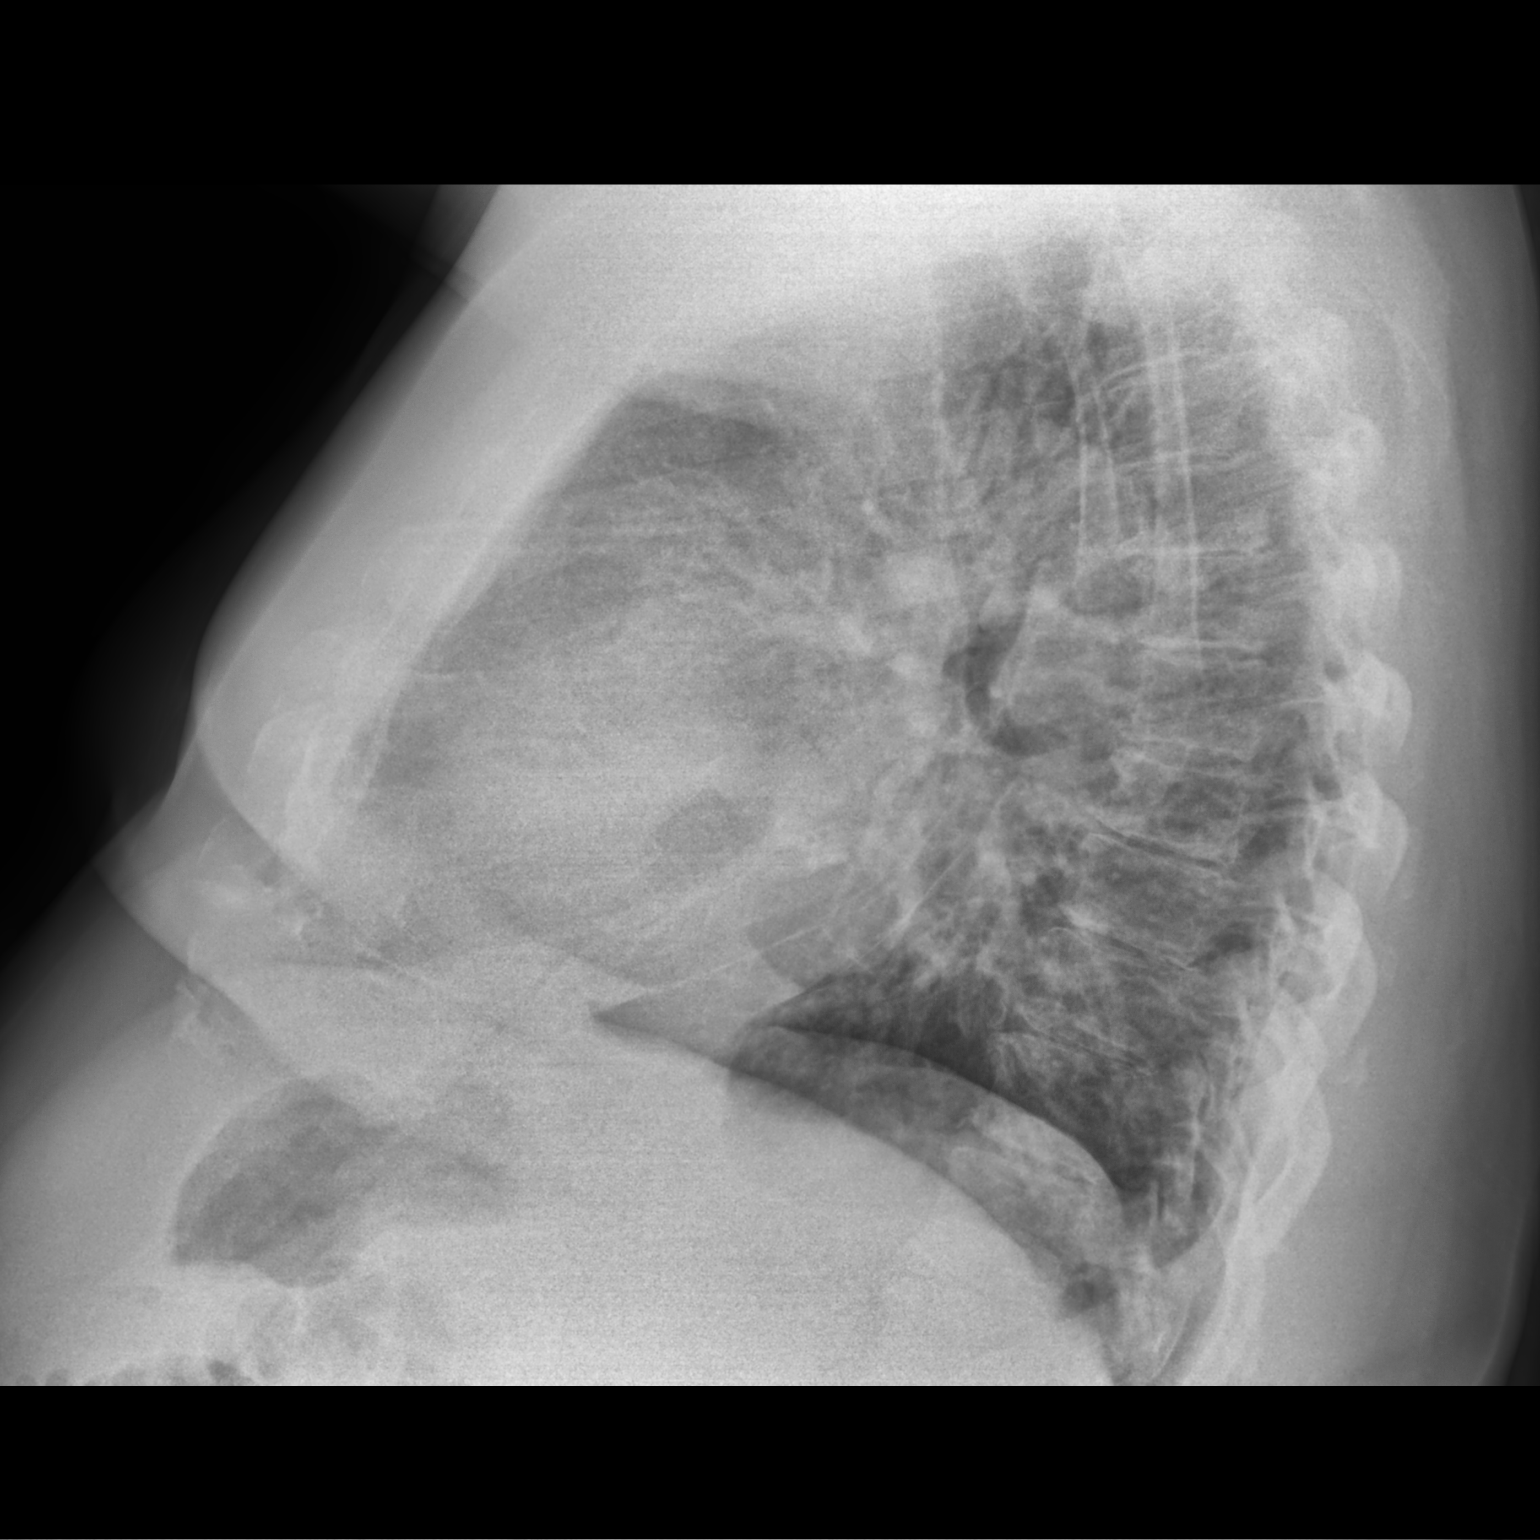

[2 of 2 positions shown; findings below may reference images not displayed]

FINDINGS: The heart size and mediastinal contours are within normal limits.
Both lungs are clear. The visualized skeletal structures are
unremarkable.
IMPRESSION: No active cardiopulmonary disease.

## 2020-01-15 MED ORDER — BENZONATATE 100 MG PO CAPS: 100.0000 mg | ORAL_CAPSULE | Freq: Three times a day (TID) | ORAL | 0 refills | Status: DC

## 2020-01-15 MED ORDER — PREDNISONE 50 MG PO TABS: 50.0000 mg | ORAL_TABLET | Freq: Every day | ORAL | 0 refills | Status: DC

## 2020-01-15 NOTE — ED Provider Notes (Signed)
Erik Wiggins    CSN: [id number] Arrival date & time: 01/15/20  1230      History   Chief Complaint Chief Complaint  Patient presents with  . URI    HPI Erik Wiggins is a 74 y.o. male with history of obesity, OSA, allergies, moderate persistent asthma, allergies presenting for 3-week course of URI symptoms.  Patient states has been previously evaluated for this by his PCP: Given azithromycin without improved.  Patient states he was not given steroids which typically "kicks it".  Patient denies Covid testing, chest pain, palpitations, fever, thrashes, myalgias.  No lower leg swelling, rash, change in urination or bowel habit.   Past Medical History:  Diagnosis Date  . Allergic rhinitis   . Asthma   . Asthma, mild intermittent, well-controlled 12/22/2014  . High cholesterol   . Hypertension   . Low testosterone   . Moderate persistent asthma, uncomplicated 5/63/8756  . Nasal polyps 06/21/2014   Demonstrated on previous sinus CT by report. No history of aspirin intolerance   . Obesity 06/21/2014  . OSA (obstructive sleep apnea)    OSA, uses CPAP nightly  . Perennial allergic rhinitis 06/21/2014   Allergy vaccine started 2012 in [place]   . Umbilical hernia     Patient Active Problem List   Diagnosis Date Noted  . Umbilical hernia 43/32/9518  . Moderate persistent asthma, uncomplicated 84/16/6063  . Asthma, mild intermittent, well-controlled 12/22/2014  . Perennial allergic rhinitis 06/21/2014  . Nasal polyps 06/21/2014  . Obesity 06/21/2014  . OSA (obstructive sleep apnea) 06/05/2014    Past Surgical History:  Procedure Laterality Date  . CARDIAC CATHETERIZATION    . COLONSCOPY    . EYE SURGERY     cataracts  . INSERTION OF MESH N/A 02/11/2018   Procedure: INSERTION OF MESH;  Surgeon: Armandina Gemma, MD;  Location: [facility];  Service: General;  Laterality: N/A;  . UMBILICAL HERNIA REPAIR N/A 02/11/2018   Procedure: OPEN REPAIR UMBILICAL  HERNIA;  Surgeon: Armandina Gemma, MD;  Location: [facility];  Service: General;  Laterality: N/A;       Home Medications    Prior to Admission medications   Medication Sig Start Date End Date Taking? Authorizing Provider  albuterol (PROAIR HFA) 108 (90 Base) MCG/ACT inhaler Inhale 4 puffs into the lungs every 4 (four) hours as needed for wheezing or shortness of breath. 2 puffs every 6 hrs as needed Patient taking differently: Inhale 2 puffs into the lungs every 4 (four) hours as needed for wheezing or shortness of breath.  08/12/16   Erik Shaggy, MD  azelastine (ASTELIN) 0.1 % nasal spray Place 1 spray into both nostrils 2 (two) times daily. Use in each nostril as directed Patient taking differently: Place 1 spray into both nostrils 2 (two) times daily as needed for allergies. Use in each nostril as directed 11/03/16   Erik Shaggy, MD  benzonatate (TESSALON) 100 MG capsule Take 1 capsule (100 mg total) by mouth every 8 (eight) hours. 01/15/20   Hall-Potvin, [place], PA-C  cetirizine (ZYRTEC) 10 MG tablet Take 10 mg by mouth daily.    [provider]  Cholecalciferol (VITAMIN D) 2000 units CAPS Take 4,000 Units by mouth daily.     [provider]  indapamide (LOZOL) 2.5 MG tablet Take 2.5 mg by mouth daily.  05/31/14   [provider]  ipratropium (ATROVENT) 0.03 % nasal spray Place 1 spray into both nostrils 2 (two) times  daily as needed for rhinitis.  05/16/14   [provider]  montelukast (SINGULAIR) 10 MG tablet Take 10 mg by mouth at bedtime.  05/31/14   [provider]  Polyethyl Glycol-Propyl Glycol (SYSTANE) 0.4-0.3 % SOLN Place 2 drops into both eyes daily as needed (for dry eyes).    [provider]  predniSONE (DELTASONE) 50 MG tablet Take 1 tablet (50 mg total) by mouth daily with breakfast. 01/15/20   Hall-Potvin, [place], PA-C  sertraline (ZOLOFT) 50 MG tablet Take 50 mg by mouth daily.  10/29/16    [provider]  simvastatin (ZOCOR) 40 MG tablet Take 40 mg by mouth at bedtime.  05/31/14   [provider]  testosterone cypionate (DEPOTESTOSTERONE CYPIONATE) 200 MG/ML injection Inject 200 mg into the muscle every 14 (fourteen) days.    [provider]  triamcinolone (NASACORT ALLERGY 24HR) 55 MCG/ACT AERO nasal inhaler Place 1 spray into the nose 2 (two) times daily.    [provider]    Family History Family History  Problem Relation Age of Onset  . Healthy Mother   . Heart attack Father 83  . AAA (abdominal aortic aneurysm) Father   . Healthy Son   . Healthy Son   . Healthy Son   . Cancer Maternal Grandfather   . Multiple sclerosis Son        youngest  . Allergic rhinitis Neg Hx   . Angioedema Neg Hx   . Asthma Neg Hx   . Atopy Neg Hx   . Eczema Neg Hx   . Immunodeficiency Neg Hx   . Urticaria Neg Hx     Social History Social History   Tobacco Use  . Smoking status: Former Smoker    Years: 1.00    Types: Cigars    Quit date: 07/21/1995    Years since quitting: 24.5  . Smokeless tobacco: Never Used  Substance Use Topics  . Alcohol use: Yes    Alcohol/week: 0.0 standard drinks    Comment: social  . Drug use: No     Allergies   Lisinopril and Sulfa antibiotics   Review of Systems As per HPI   Physical Exam Triage Vital Signs ED Triage Vitals  Enc Vitals Group     BP      Pulse      Resp      Temp      Temp src      SpO2      Weight      Height      Head Circumference      Peak Flow      Pain Score      Pain Loc      Pain Edu?      Excl. in [place]?    No data found.  Updated Vital Signs BP (!) 144/65 (BP Location: Left Arm)   Pulse 65   Temp 98.6 F (37 C) (Oral)   Resp 20   SpO2 95%   Visual Acuity Right Eye Distance:   Left Eye Distance:   Bilateral Distance:    Right Eye Near:   Left Eye Near:    Bilateral Near:     Physical Exam Constitutional:      General: He is not in acute  distress.    Appearance: He is obese. He is not toxic-appearing or diaphoretic.  HENT:     Head: Normocephalic and atraumatic.     Mouth/Throat:     Mouth: Mucous membranes  are moist.     Pharynx: Oropharynx is clear.  Eyes:     General: No scleral icterus.    Conjunctiva/sclera: Conjunctivae normal.     Pupils: Pupils are equal, round, and reactive to light.  Neck:     Comments: Trachea midline, negative JVD Cardiovascular:     Rate and Rhythm: Normal rate and regular rhythm.  Pulmonary:     Effort: Pulmonary effort is normal. No respiratory distress.     Breath sounds: No wheezing.  Musculoskeletal:     Cervical back: Neck supple. No tenderness.  Lymphadenopathy:     Cervical: No cervical adenopathy.  Skin:    Capillary Refill: Capillary refill takes less than 2 seconds.     Coloration: Skin is not jaundiced or pale.     Findings: No rash.  Neurological:     Mental Status: He is alert and oriented to person, place, and time.      UC Treatments / Results  Labs (all labs ordered are listed, but only abnormal results are displayed) Labs Reviewed  POC SARS CORONAVIRUS 2 AG -  ED - Normal    EKG   Radiology DG Chest 2 View  Result Date: 01/15/2020 CLINICAL DATA:  Dry cough, nasal congestion EXAM: CHEST - 2 VIEW COMPARISON:  None. FINDINGS: The heart size and mediastinal contours are within normal limits. Both lungs are clear. The visualized skeletal structures are unremarkable. IMPRESSION: No active cardiopulmonary disease. Electronically Signed   By: Kathreen Devoid   On: 01/15/2020 13:34    Procedures Procedures (including critical Wiggins time)  Medications Ordered in UC Medications - No data to display  Initial Impression / Assessment and Plan / UC Course  I have reviewed the triage vital signs and the nursing notes.  Pertinent labs & imaging results that were available during my Wiggins of the patient were reviewed by me and considered in my medical decision making  (see chart for details).     Patient afebrile, nontoxic, with SpO2 95%.  Rapid Covid negative, will defer PCR due to duration of symptoms.  Chest x-ray done office, reviewed by me radiology: Negative for active cardiopulmonary disease.  Reviewed signs of patient verbalized understanding.  We will treat for chronic bronchitis as outlined below.  Patient to follow-up with PCP in 1-2 weeks for further evaluation if needed.  Return precautions discussed, patient verbalized understanding and is agreeable to plan. Final Clinical Impressions(s) / UC Diagnoses   Final diagnoses:  Chronic bronchitis, unspecified chronic bronchitis type [facility])     Discharge Instructions     Tessalon for cough. Start flonase, atrovent nasal spray for nasal congestion/drainage. You can use over the counter nasal saline rinse such as neti pot for nasal congestion. Keep hydrated, your urine should be clear to pale yellow in color. Tylenol/motrin for fever and pain. Monitor for any worsening of symptoms, chest pain, shortness of breath, wheezing, swelling of the throat, go to the emergency department for further evaluation needed.     ED Prescriptions    Medication Sig Dispense Auth. Provider   predniSONE (DELTASONE) 50 MG tablet Take 1 tablet (50 mg total) by mouth daily with breakfast. 5 tablet Hall-Potvin, [place], PA-C   benzonatate (TESSALON) 100 MG capsule Take 1 capsule (100 mg total) by mouth every 8 (eight) hours. 21 capsule Hall-Potvin, [place], PA-C     PDMP not reviewed this encounter.   Hall-Potvin, [place], [place] 01/15/20 1414

## 2020-01-15 NOTE — ED Triage Notes (Addendum)
Pt presents to [facility] for assessment of cough, nasal congestion, and wheezing x 3 weeks.  Pt has tried nasacort, ipratropium, allergy pills and azelastine without relief. Spoke to doctor last week who called in a z-pack, no change in symptoms.

## 2020-01-15 NOTE — Discharge Instructions (Signed)

## 2020-02-01 DIAGNOSIS — M25562 Pain in left knee: Secondary | ICD-10-CM | POA: Insufficient documentation

## 2020-02-19 DIAGNOSIS — M25562 Pain in left knee: Secondary | ICD-10-CM | POA: Diagnosis not present

## 2020-03-01 DIAGNOSIS — H35373 Puckering of macula, bilateral: Secondary | ICD-10-CM | POA: Diagnosis not present

## 2020-03-01 DIAGNOSIS — H04123 Dry eye syndrome of bilateral lacrimal glands: Secondary | ICD-10-CM | POA: Diagnosis not present

## 2020-03-07 DIAGNOSIS — M25462 Effusion, left knee: Secondary | ICD-10-CM | POA: Diagnosis not present

## 2020-03-07 DIAGNOSIS — M1712 Unilateral primary osteoarthritis, left knee: Secondary | ICD-10-CM | POA: Diagnosis not present

## 2020-03-07 DIAGNOSIS — M25562 Pain in left knee: Secondary | ICD-10-CM | POA: Diagnosis not present

## 2020-03-13 DIAGNOSIS — M1712 Unilateral primary osteoarthritis, left knee: Secondary | ICD-10-CM | POA: Diagnosis not present

## 2020-03-13 DIAGNOSIS — M25562 Pain in left knee: Secondary | ICD-10-CM | POA: Diagnosis not present

## 2020-03-20 DIAGNOSIS — M25562 Pain in left knee: Secondary | ICD-10-CM | POA: Diagnosis not present

## 2020-03-20 DIAGNOSIS — M1712 Unilateral primary osteoarthritis, left knee: Secondary | ICD-10-CM | POA: Diagnosis not present

## 2020-03-26 ENCOUNTER — Other Ambulatory Visit: Payer: Self-pay

## 2020-03-26 ENCOUNTER — Ambulatory Visit (INDEPENDENT_AMBULATORY_CARE_PROVIDER_SITE_OTHER): Payer: Medicare Other | Admitting: Allergy

## 2020-03-26 ENCOUNTER — Encounter: Payer: Self-pay | Admitting: Allergy

## 2020-03-26 VITALS — BP 140/80 | HR 70 | Temp 98.0°F | Resp 20 | Ht 69.88 in | Wt 314.0 lb

## 2020-03-26 DIAGNOSIS — J3089 Other allergic rhinitis: Secondary | ICD-10-CM | POA: Diagnosis not present

## 2020-03-26 DIAGNOSIS — Z8709 Personal history of other diseases of the respiratory system: Secondary | ICD-10-CM | POA: Insufficient documentation

## 2020-03-26 DIAGNOSIS — J4540 Moderate persistent asthma, uncomplicated: Secondary | ICD-10-CM

## 2020-03-26 MED ORDER — XHANCE 93 MCG/ACT NA EXHU: 2.0000 | INHALANT_SUSPENSION | Freq: Two times a day (BID) | NASAL | 5 refills | Status: DC

## 2020-03-26 NOTE — Assessment & Plan Note (Addendum)
Perennial rhinitis symptoms for many years but worse the past 3-4 months. 2018 skin testing was positive to weeds, mold, dust mites, cat and cockroach. Used to be on allergy injections with some benefit. He also had polyps on imaging in the past but never had surgery for this per patient report.   Unable to skin test today due to recent antihistamine intake.  Start Xhance 2 sprays per nostril twice a day. Stop Nasacort.  If this is not covered let us know.  Samples given and demonstrated proper use.  Nasal saline spray (i.e., Simply Saline) or nasal saline lavage (i.e., NeilMed) is recommended as needed and prior to medicated nasal sprays.  May use over the counter antihistamines such as Zyrtec (cetirizine), Claritin (loratadine), Allegra (fexofenadine), or Xyzal (levocetirizine) daily as needed.  Continue with montelukast 10mg  daily.  Continue environmental control measures.   Will repeat skin testing and start allergy immunotherapy if above regimen does not control symptoms.

## 2020-03-26 NOTE — Patient Instructions (Addendum)
Sinuses:  Start Xhance 2 sprays per nostril twice a day. Stop Nasacort.  If this is not covered let us know.  Samples given and demonstrated proper use.  Nasal saline spray (i.e., Simply Saline) or nasal saline lavage (i.e., NeilMed) is recommended as needed and prior to medicated nasal sprays.  May use over the counter antihistamines such as Zyrtec (cetirizine), Claritin (loratadine), Allegra (fexofenadine), or Xyzal (levocetirizine) daily as needed.  Continue with montelukast 10mg  daily.  Continue environmental control measures.   Return for skin testing and follow up in 1 month  Asthma: . Today's breathing test was normal.  . Daily controller medication(s): Symbicort 1 puff twice a day and rinse mouth after each use.  . During upper respiratory infections/asthma flares: start Symbicort 2 puffs twice a day for 1-2 weeks until your breathing symptoms return to baseline.  . May use albuterol rescue inhaler 2 puffs every 4 to 6 hours as needed for shortness of breath, chest tightness, coughing, and wheezing. May use albuterol rescue inhaler 2 puffs 5 to 15 minutes prior to strenuous physical activities. Monitor frequency of use.  . Asthma control goals:  o Full participation in all desired activities (may need albuterol before activity) o Albuterol use two times or less a week on average (not counting use with activity) o Cough interfering with sleep two times or less a month o Oral steroids no more than once a year o No hospitalizations  Follow up in 1 month or sooner if needed - hold antihistamines for 3 days before.  Next time bring in the dose for the Symbicort.   Reducing Pollen Exposure . Pollen seasons: trees (spring), grass (summer) and ragweed/weeds (fall). Marland Kitchen Keep windows closed in your home and car to lower pollen exposure.  Susa Simmonds air conditioning in the bedroom and throughout the house if possible.  . Avoid going out in dry windy days - especially early  morning. . Pollen counts are highest between 5 - 10 AM and on dry, hot and windy days.  . Save outside activities for late afternoon or after a heavy rain, when pollen levels are lower.  . Avoid mowing of grass if you have grass pollen allergy. Marland Kitchen Be aware that pollen can also be transported indoors on people and pets.  . Dry your clothes in an automatic dryer rather than hanging them outside where they might collect pollen.  . Rinse hair and eyes before bedtime. Control of House Dust Mite Allergen . Dust mite allergens are a common trigger of allergy and asthma symptoms. While they can be found throughout the house, these microscopic creatures thrive in warm, humid environments such as bedding, upholstered furniture and carpeting. . Because so much time is spent in the bedroom, it is essential to reduce mite levels there.  . Encase pillows, mattresses, and box springs in special allergen-proof fabric covers or airtight, zippered plastic covers.  . Bedding should be washed weekly in hot water (130 F) and dried in a hot dryer. Allergen-proof covers are available for comforters and pillows that can't be regularly washed.  Wendee Copp the allergy-proof covers every few months. Minimize clutter in the bedroom. Keep pets out of the bedroom.  Marland Kitchen Keep humidity less than 50% by using a dehumidifier or air conditioning. You can buy a humidity measuring device called a hygrometer to monitor this.  . If possible, replace carpets with hardwood, linoleum, or washable area rugs. If that's not possible, vacuum frequently with a vacuum that has a HEPA  filter. . Remove all upholstered furniture and non-washable window drapes from the bedroom. . Remove all non-washable stuffed toys from the bedroom.  Wash stuffed toys weekly. Pet Allergen Avoidance: . Contrary to popular opinion, there are no "hypoallergenic" breeds of dogs or cats. That is because people are not allergic to an animal's hair, but to an allergen found in  the animal's saliva, dander (dead skin flakes) or urine. Pet allergy symptoms typically occur within minutes. For some people, symptoms can build up and become most severe 8 to 12 hours after contact with the animal. People with severe allergies can experience reactions in public places if dander has been transported on the pet owners' clothing. Marland Kitchen Keeping an animal outdoors is only a partial solution, since homes with pets in the yard still have higher concentrations of animal allergens. . Before getting a pet, ask your allergist to determine if you are allergic to animals. If your pet is already considered part of your family, try to minimize contact and keep the pet out of the bedroom and other rooms where you spend a great deal of time. . As with dust mites, vacuum carpets often or replace carpet with a hardwood floor, tile or linoleum. . High-efficiency particulate air (HEPA) cleaners can reduce allergen levels over time. . While dander and saliva are the source of cat and dog allergens, urine is the source of allergens from rabbits, hamsters, mice and [place] pigs; so ask a non-allergic family member to clean the animal's cage. . If you have a pet allergy, talk to your allergist about the potential for allergy immunotherapy (allergy shots). This strategy can often provide long-term relief. Cockroach Allergen Avoidance Cockroaches are often found in the homes of densely populated urban areas, schools or commercial buildings, but these creatures can lurk almost anywhere. This does not mean that you have a dirty house or living area. . Block all areas where roaches can enter the home. This includes crevices, wall cracks and windows.  . Cockroaches need water to survive, so fix and seal all leaky faucets and pipes. Have an exterminator go through the house when your family and pets are gone to eliminate any remaining roaches. Marland Kitchen Keep food in lidded containers and put pet food dishes away after your pets  are done eating. Vacuum and sweep the floor after meals, and take out garbage and recyclables. Use lidded garbage containers in the kitchen. Wash dishes immediately after use and clean under stoves, refrigerators or toasters where crumbs can accumulate. Wipe off the stove and other kitchen surfaces and cupboards regularly. Mold Control . Mold and fungi can grow on a variety of surfaces provided certain temperature and moisture conditions exist.  . Outdoor molds grow on plants, decaying vegetation and soil. The major outdoor mold, Alternaria and Cladosporium, are found in very high numbers during hot and dry conditions. Generally, a late summer - fall peak is seen for common outdoor fungal spores. Rain will temporarily lower outdoor mold spore count, but counts rise rapidly when the rainy period ends. . The most important indoor molds are Aspergillus and Penicillium. Dark, humid and poorly ventilated basements are ideal sites for mold growth. The next most common sites of mold growth are the bathroom and the kitchen. Outdoor (Seasonal) Mold Control . Use air conditioning and keep windows closed. . Avoid exposure to decaying vegetation. Marland Kitchen Avoid leaf raking. . Avoid grain handling. . Consider wearing a face mask if working in moldy areas.  Indoor (Perennial) Mold Control  .  Maintain humidity below 50%. . Get rid of mold growth on hard surfaces with water, detergent and, if necessary, 5% bleach (do not mix with other cleaners). Then dry the area completely. If mold covers an area more than 10 square feet, consider hiring an indoor environmental professional. . For clothing, washing with soap and water is best. If moldy items cannot be cleaned and dried, throw them away. . Remove sources e.g. contaminated carpets. . Repair and seal leaking roofs or pipes. Using dehumidifiers in damp basements may be helpful, but empty the water and clean units regularly to prevent mildew from forming. All rooms,  especially basements, bathrooms and kitchens, require ventilation and cleaning to deter mold and mildew growth. Avoid carpeting on concrete or damp floors, and storing items in damp areas.

## 2020-03-26 NOTE — Assessment & Plan Note (Signed)
Diagnosed with asthma over 12 years ago. Currently on unknown dose of Symbicort 1 puff BID and doing well.   Today's spirometry was normal. . Daily controller medication(s): Symbicort 1 puff twice a day and rinse mouth after each use.  . During upper respiratory infections/asthma flares: start Symbicort 2 puffs twice a day for 1-2 weeks until your breathing symptoms return to baseline.  . May use albuterol rescue inhaler 2 puffs every 4 to 6 hours as needed for shortness of breath, chest tightness, coughing, and wheezing. May use albuterol rescue inhaler 2 puffs 5 to 15 minutes prior to strenuous physical activities. Monitor frequency of use.  . Asked patient to bring in dosing of Symbicort at next visit.

## 2020-03-26 NOTE — Assessment & Plan Note (Addendum)
History of nasal polyps but no surgical intervention per patient report.  Start Xhance 2 sprays per nostril twice a day as above.  If no improvement may need ENT referral as polyp was not visible on today's physical exam.  If polyps are still present, Dupixent injections maybe a good option as patient is hesitant about polypectomy surgery.

## 2020-03-26 NOTE — Progress Notes (Signed)
New Patient Note  RE: Erik Wiggins MRN: [id number] DOB: 01/04/1946 Date of Office Visit: 03/26/2020  Referring provider: No ref. provider found Primary care provider: Deland Pretty, MD  Chief Complaint: Asthma and Sinusitis  History of Present Illness: I had the pleasure of seeing Erik Wiggins for initial evaluation at the [facility] on 03/26/2020. He is a 74 y.o. male, who is self-referred for the re-evaluation of asthma and sinus issues. Patient was followed in our office for asthma and rhinitis. Last OV on 11/12/2016.   Asthma: He reports symptoms of shortness of breath for 12 years. Current medications include Symbicort unknown dose 1 puff BID and albuterol prn which help. He reports not using aerochamber with inhalers. He tried the following inhalers: Advair. Main triggers are allergies and exertion. In the last month, frequency of symptoms: <1x/week. Frequency of nocturnal symptoms: 0x/month. Frequency of SABA use: <1x/week. Interference with physical activity: yes. Sleep is undisturbed. In the last 12 months, emergency room visits/urgent care visits/doctor office visits or hospitalizations due to respiratory issues: no. In the last 12 months, oral steroids courses: not for asthma. Lifetime history of hospitalization for respiratory issues: no. Prior intubations: no. History of pneumonia: no. He was evaluated by allergist in the past. Smoking exposure: smoked cigars over 15 years ago. Up to date with flu vaccine: yes. Up to date with pneumonia vaccine: no. Up to date with COVID-19 vaccine: yes.   Rhinitis: He reports symptoms of nasal congestion, sneezing, itchy nose, itchy/watery eyes. Symptoms have been going on for many years but worse the past 3-4 months. The symptoms are present all year around with worsening in spring and fall. Other triggers include exposure to no. Anosmia: diminished sense of smell. Headache: no. He has used Nasacort 1-2 sprays per nostril 3  times per day, azelastine, ipratropium with some improvement in symptoms. No nosebleeds. Takes Singulair and zyrtec with unknown benefit. Sinus infections: 1 and treated with prednisone with good benefit. Previous work up includes: 2018 skin testing was positive to weeds, mold, dust mites, cat and cockroach. Patient was on allergy injections for about 5 years with some benefit.  Previous ENT evaluation: no. Previous sinus imaging: no. History of nasal polyps: yes  Last eye exam: August 2021. History of reflux: no.  Assessment and Plan: Erik Wiggins is a 74 y.o. male with: Other allergic rhinitis Perennial rhinitis symptoms for many years but worse the past 3-4 months. 2018 skin testing was positive to weeds, mold, dust mites, cat and cockroach. Used to be on allergy injections with some benefit. He also had polyps on imaging in the past but never had surgery for this per patient report.   Unable to skin test today due to recent antihistamine intake.  Start Xhance 2 sprays per nostril twice a day. Stop Nasacort.  If this is not covered let us know.  Samples given and demonstrated proper use.  Nasal saline spray (i.e., Simply Saline) or nasal saline lavage (i.e., NeilMed) is recommended as needed and prior to medicated nasal sprays.  May use over the counter antihistamines such as Zyrtec (cetirizine), Claritin (loratadine), Allegra (fexofenadine), or Xyzal (levocetirizine) daily as needed.  Continue with montelukast 10mg  daily.  Continue environmental control measures.   Will repeat skin testing and start allergy immunotherapy if above regimen does not control symptoms.  History of nasal polyp History of nasal polyps but no surgical intervention per patient report.  Start Xhance 2 sprays per nostril twice a day as above.  If no improvement may need ENT referral as polyp was not visible on today's physical exam.  If polyps are still present, Dupixent injections maybe a good option as  patient is hesitant about polypectomy surgery.   Moderate persistent asthma without complication Diagnosed with asthma over 12 years ago. Currently on unknown dose of Symbicort 1 puff BID and doing well.   Today's spirometry was normal. . Daily controller medication(s): Symbicort 1 puff twice a day and rinse mouth after each use.  . During upper respiratory infections/asthma flares: start Symbicort 2 puffs twice a day for 1-2 weeks until your breathing symptoms return to baseline.  . May use albuterol rescue inhaler 2 puffs every 4 to 6 hours as needed for shortness of breath, chest tightness, coughing, and wheezing. May use albuterol rescue inhaler 2 puffs 5 to 15 minutes prior to strenuous physical activities. Monitor frequency of use.  . Asked patient to bring in dosing of Symbicort at next visit.   Return in about 4 weeks (around 04/23/2020) for Skin testing.  Meds ordered this encounter  Medications  . Fluticasone Propionate (XHANCE) 93 MCG/ACT EXHU    Sig: Place 2 sprays into both nostrils 2 (two) times daily.    Dispense:  32 mL    Refill:  5   Other allergy screening: Food allergy: no Medication allergy: yes Hymenoptera allergy: no Urticaria: no Eczema:no History of recurrent infections suggestive of immunodeficency: no  Diagnostics: Spirometry:  Tracings reviewed. His effort: Good reproducible efforts. FVC: 2.56L FEV1: 2.15L, 118% predicted FEV1/FVC ratio: 84% Interpretation: Spirometry consistent with normal pattern.  Please see scanned spirometry results for details.  Skin Testing: Deferred due to recent antihistamines use.  Past Medical History: Patient Active Problem List   Diagnosis Date Noted  . History of nasal polyp 03/26/2020  . Umbilical hernia 87/56/4332  . Moderate persistent asthma without complication 95/18/8416  . Asthma, mild intermittent, well-controlled 12/22/2014  . Other allergic rhinitis 06/21/2014  . Nasal polyps 06/21/2014  . Obesity  06/21/2014  . OSA (obstructive sleep apnea) 06/05/2014   Past Medical History:  Diagnosis Date  . Allergic rhinitis   . Asthma   . Asthma, mild intermittent, well-controlled 12/22/2014  . High cholesterol   . Hypertension   . Low testosterone   . Moderate persistent asthma, uncomplicated 12/24/3014  . Nasal polyps 06/21/2014   Demonstrated on previous sinus CT by report. No history of aspirin intolerance   . Obesity 06/21/2014  . OSA (obstructive sleep apnea)    OSA, uses CPAP nightly  . Perennial allergic rhinitis 06/21/2014   Allergy vaccine started 2012 in [place]   . Umbilical hernia    Past Surgical History: Past Surgical History:  Procedure Laterality Date  . CARDIAC CATHETERIZATION    . COLONSCOPY    . EYE SURGERY     cataracts  . INSERTION OF MESH N/A 02/11/2018   Procedure: INSERTION OF MESH;  Surgeon: Armandina Gemma, MD;  Location: [facility];  Service: General;  Laterality: N/A;  . UMBILICAL HERNIA REPAIR N/A 02/11/2018   Procedure: OPEN REPAIR UMBILICAL HERNIA;  Surgeon: Armandina Gemma, MD;  Location: [facility];  Service: General;  Laterality: N/A;   Medication List:  Current Outpatient Medications  Medication Sig Dispense Refill  . albuterol (PROAIR HFA) 108 (90 Base) MCG/ACT inhaler Inhale 4 puffs into the lungs every 4 (four) hours as needed for wheezing or shortness of breath. 2 puffs every 6 hrs as needed (Patient taking differently: Inhale 2 puffs  into the lungs every 4 (four) hours as needed for wheezing or shortness of breath. ) 1 Inhaler 2  . azelastine (ASTELIN) 0.1 % nasal spray Place 1 spray into both nostrils 2 (two) times daily. Use in each nostril as directed (Patient taking differently: Place 1 spray into both nostrils 2 (two) times daily as needed for allergies. Use in each nostril as directed) 90 mL 1  . Budesonide-Formoterol Fumarate (SYMBICORT IN) Inhale 1 puff into the lungs in the morning and at bedtime.    . cetirizine  (ZYRTEC) 10 MG tablet Take 10 mg by mouth daily.    . Cholecalciferol (VITAMIN D) 2000 units CAPS Take 4,000 Units by mouth daily.     . indapamide (LOZOL) 2.5 MG tablet Take 2.5 mg by mouth daily.     Marland Kitchen ipratropium (ATROVENT) 0.03 % nasal spray Place 1 spray into both nostrils 2 (two) times daily as needed for rhinitis.   6  . montelukast (SINGULAIR) 10 MG tablet Take 10 mg by mouth at bedtime.     Vladimir Faster Glycol-Propyl Glycol (SYSTANE) 0.4-0.3 % SOLN Place 2 drops into both eyes daily as needed (for dry eyes).    . sertraline (ZOLOFT) 50 MG tablet Take 50 mg by mouth daily.     . simvastatin (ZOCOR) 40 MG tablet Take 40 mg by mouth at bedtime.     . Fluticasone Propionate (XHANCE) 93 MCG/ACT EXHU Place 2 sprays into both nostrils 2 (two) times daily. 32 mL 5  . testosterone cypionate (DEPOTESTOSTERONE CYPIONATE) 200 MG/ML injection Inject 200 mg into the muscle every 14 (fourteen) days. (Patient not taking: Reported on 03/26/2020)     No current facility-administered medications for this visit.   Allergies: Allergies  Allergen Reactions  . Lisinopril Swelling and Other (See Comments)    Swollen lips  . Sulfa Antibiotics Hives   Social History: Social History   Socioeconomic History  . Marital status: Married    Spouse name: Not on file  . Number of children: 3  . Years of education: Not on file  . Highest education level: Not on file  Occupational History  . Occupation: Hotel manager  Tobacco Use  . Smoking status: Former Smoker    Years: 1.00    Types: Cigars    Quit date: 07/21/1995    Years since quitting: 24.6  . Smokeless tobacco: Never Used  Vaping Use  . Vaping Use: Never used  Substance and Sexual Activity  . Alcohol use: Yes    Alcohol/week: 0.0 standard drinks    Comment: social  . Drug use: No  . Sexual activity: Not on file    Comment: MARRIED  Other Topics Concern  . Not on file  Social History Narrative  . Not on file   Social  Determinants of Health   Financial Resource Strain:   . Difficulty of Paying Living Expenses: Not on file  Food Insecurity:   . Worried About Charity fundraiser in the Last Year: Not on file  . Ran Out of Food in the Last Year: Not on file  Transportation Needs:   . Lack of Transportation (Medical): Not on file  . Lack of Transportation (Non-Medical): Not on file  Physical Activity:   . Days of Exercise per Week: Not on file  . Minutes of Exercise per Session: Not on file  Stress:   . Feeling of Stress : Not on file  Social Connections:   . Frequency of Communication with Friends and  Family: Not on file  . Frequency of Social Gatherings with Friends and Family: Not on file  . Attends Religious Services: Not on file  . Active Member of Clubs or Organizations: Not on file  . Attends Archivist Meetings: Not on file  . Marital Status: Not on file   Lives in a 75 year old home. Smoking: denies Occupation: retired  Programme researcher, broadcasting/film/video HistoryFreight forwarder in the house: no Charity fundraiser in the family room: no Carpet in the bedroom: yes Heating: gas Cooling: central Pet: yes 1 dog x 12 yrs  Family History: Family History  Problem Relation Age of Onset  . Healthy Mother   . Heart attack Father 30  . AAA (abdominal aortic aneurysm) Father   . Healthy Son   . Healthy Son   . Healthy Son   . Cancer Maternal Grandfather   . Multiple sclerosis Son        youngest  . Allergic rhinitis Neg Hx   . Angioedema Neg Hx   . Asthma Neg Hx   . Atopy Neg Hx   . Eczema Neg Hx   . Immunodeficiency Neg Hx   . Urticaria Neg Hx    Review of Systems  Constitutional: Negative for appetite change, chills, fever and unexpected weight change.  HENT: Positive for congestion and sneezing. Negative for rhinorrhea.   Eyes: Positive for itching.  Respiratory: Negative for cough, chest tightness, shortness of breath and wheezing.   Cardiovascular: Negative for chest pain.  Gastrointestinal:  Negative for abdominal pain.  Genitourinary: Negative for difficulty urinating.  Skin: Negative for rash.  Allergic/Immunologic: Positive for environmental allergies.  Neurological: Negative for headaches.   Objective: BP 140/80   Pulse 70   Temp 98 F (36.7 C) (Oral)   Resp 20   Ht 5' 9.88" (1.775 m)   Wt (!) 314 lb (142.4 kg)   SpO2 96%   BMI 45.21 kg/m  Body mass index is 45.21 kg/m. Physical Exam Vitals and nursing note reviewed.  Constitutional:      Appearance: Normal appearance. He is well-developed.  HENT:     Head: Normocephalic and atraumatic.     Right Ear: Tympanic membrane and external ear normal.     Left Ear: Tympanic membrane and external ear normal.     Nose: Nose normal.     Mouth/Throat:     Mouth: Mucous membranes are moist.     Pharynx: Oropharynx is clear.  Eyes:     Conjunctiva/sclera: Conjunctivae normal.  Cardiovascular:     Rate and Rhythm: Normal rate and regular rhythm.     Heart sounds: Normal heart sounds. No murmur heard.  No friction rub. No gallop.   Pulmonary:     Effort: Pulmonary effort is normal.     Breath sounds: Normal breath sounds. No wheezing, rhonchi or rales.  Musculoskeletal:     Cervical back: Neck supple.  Skin:    General: Skin is warm.     Findings: No rash.  Neurological:     Mental Status: He is alert and oriented to person, place, and time.  Psychiatric:        Behavior: Behavior normal.    The plan was reviewed with the patient/family, and all questions/concerned were addressed.  It was my pleasure to see Erik Wiggins today and participate in his care. Please feel free to contact me with any questions or concerns.  Sincerely,  Rexene Alberts, DO Allergy & Immunology  [facility]  [facility] office: [phone] [place] office: [place] office: [phone]

## 2020-03-28 DIAGNOSIS — M1712 Unilateral primary osteoarthritis, left knee: Secondary | ICD-10-CM | POA: Diagnosis not present

## 2020-03-28 DIAGNOSIS — M25562 Pain in left knee: Secondary | ICD-10-CM | POA: Diagnosis not present

## 2020-04-02 ENCOUNTER — Telehealth: Payer: Self-pay

## 2020-04-02 NOTE — Telephone Encounter (Signed)
Prior authorization submitted for Xhance nasal spray on covermymeds.

## 2020-04-02 NOTE — Telephone Encounter (Signed)
Prior auth approved and sent to pharmacy. Approval notification also sent to scan center.

## 2020-04-03 DIAGNOSIS — M1712 Unilateral primary osteoarthritis, left knee: Secondary | ICD-10-CM | POA: Diagnosis not present

## 2020-04-03 DIAGNOSIS — M25562 Pain in left knee: Secondary | ICD-10-CM | POA: Diagnosis not present

## 2020-04-07 ENCOUNTER — Encounter: Payer: Self-pay | Admitting: Allergy

## 2020-04-08 DIAGNOSIS — M25562 Pain in left knee: Secondary | ICD-10-CM | POA: Diagnosis not present

## 2020-04-22 NOTE — Progress Notes (Signed)
Follow Up Note  RE: Erik Wiggins MRN: [id number] DOB: August 24, 1945 Date of Office Visit: 04/23/2020  Referring provider: Deland Pretty, MD Primary care provider: Deland Pretty, MD  Chief Complaint: Allergies and Asthma  History of Present Illness: I had the pleasure of seeing Brantley Stage for a follow up visit at the [facility] on 04/23/2020. He is a 74 y.o. male, who is being followed for allergic rhinitis, history of nasal polyp, asthma. His previous allergy office visit was on 03/26/2020 with Dr. Maudie Mercury. Today is a regular follow up visit.  Other allergic rhinitis / history of nasal polyp Patient is doing Xhance 2 sprays per nostril once a day which helps better than Nasacort but it was costing $600/month. Currently on Singulair and zyrtec daily.  Now doing much better and not sure about wanting to start allergy injections.   Moderate persistent asthma Currently on Symbicort 13mcg 1 puff Bid with good benefit. Denies any SOB, coughing, wheezing, chest tightness, nocturnal awakenings, ER/urgent care visits or prednisone use since the last visit.  Assessment and Plan: Erik Wiggins is a 74 y.o. male with: Moderate persistent asthma without complication Past history - Diagnosed with asthma over 12 years ago. Currently on unknown dose of Symbicort 1 puff BID and doing well. 2021 spirometry was normal. Interim history - Doing well with below regimen. . Daily controller medication(s): Symbicort 72mcg 1 puff twice a day and rinse mouth after each use.  . During upper respiratory infections/asthma flares: start Symbicort 2 puffs twice a day for 1-2 weeks until your breathing symptoms return to baseline.  . May use albuterol rescue inhaler 2 puffs every 4 to 6 hours as needed for shortness of breath, chest tightness, coughing, and wheezing. May use albuterol rescue inhaler 2 puffs 5 to 15 minutes prior to strenuous physical activities. Monitor frequency of use.   Other  allergic rhinitis Past history - Perennial rhinitis symptoms for many years but worse the past 3-4 months. 2018 skin testing was positive to weeds, mold, dust mites, cat and cockroach. Used to be on allergy injections with some benefit. He also had polyps on imaging in the past but never had surgery for this per patient report.  Interim history - Erik Wiggins is working better than Nasacort but cost is $600/month which he can't afford.  Today's skin testing showed: Positive to grass, trees, mold, cat.   Start environmental control measures as below.   Continue Xhance 2 sprays per nostril twice a day.   Sample given.  Please call your insurance to make sure the cost is $600. Apparently the drug was approved. I will check on any assistance programs and will let you know if there are any for Medicare patients.  If too expensive, may go back to Nasacort 1 spray per nostril twice a day.   Nasal saline spray (i.e., Simply Saline) or nasal saline lavage (i.e., NeilMed) is recommended as needed and prior to medicated nasal sprays.  May use over the counter antihistamines such as Zyrtec (cetirizine) 10mg  daily as needed.  Continue with montelukast 10mg  daily.  Continue environmental control measures.   If above treatment does not help then we can discuss starting allergy injections - handout given.  History of nasal polyp Past history - History of nasal polyps but no surgical intervention per patient report.  Continue Xhance 2 sprays per nostril twice a day as above.  If no improvement may need ENT referral as polyp was not visible on today's physical  exam.  If polyps are still present, Dupixent injections maybe a good option as patient is hesitant about polypectomy surgery.   Return in about 6 months (around 10/22/2020).  Diagnostics: Skin Testing: Environmental allergy panel. Positive to grass, trees, mold, cat.  Results discussed with patient/family.  Airborne Adult Perc - 04/23/20 1649      Time Antigen Placed 1649    Allergen Manufacturer Lavella Hammock    Location Back    Number of Test 59    Panel 1 Select    1. Control-Buffer 50% Glycerol Negative    2. Control-Histamine 1 mg/ml 2+    3. Albumin saline Negative    4. Grainger Negative    5. [place] Negative    6. Johnson Negative    7. [place] Blue Negative    8. Meadow Fescue Negative    9. Perennial Rye Negative    10. Sweet Vernal Negative    11. Timothy Negative    12. Cocklebur Negative    13. Burweed Marshelder Negative    14. Ragweed, short Negative    15. Ragweed, Giant Negative    16. Plantain,  English Negative    17. Lamb's Quarters Negative    18. Sheep Sorrell Negative    19. Rough Pigweed Negative    20. Marsh Elder, Rough Negative    21. Mugwort, Common Negative    22. Ash mix Negative    23. Birch mix Negative    24. Beech American Negative    25. Box, Elder Negative    26. Cedar, red Negative    27. Cottonwood, Eastern 2+    28. Elm mix Negative    29. Hickory Negative    30. Maple mix Negative    31. Oak, [place] mix Negative    32. Pecan Pollen Negative    33. Pine mix Negative    34. Sycamore Eastern Negative    35. [place], Black Pollen Negative    36. Alternaria alternata 2+    37. Cladosporium Herbarum Negative    38. Aspergillus mix Negative    39. Penicillium mix Negative    40. Bipolaris sorokiniana (Helminthosporium) Negative    41. Drechslera spicifera (Curvularia) Negative    42. Mucor plumbeus Negative    43. Fusarium moniliforme Negative    44. Aureobasidium pullulans (pullulara) 2+    45. Rhizopus oryzae Negative    46. Botrytis cinera Negative    47. Epicoccum nigrum Negative    48. Phoma betae Negative    49. Candida Albicans Negative    50. Trichophyton mentagrophytes Negative    51. Mite, D Farinae  5,000 AU/ml Negative    52. Mite, D Pteronyssinus  5,000 AU/ml Negative    53. Cat Hair 10,000 BAU/ml Negative    54.  Dog Epithelia Negative    55. Mixed Feathers  Negative    56. Horse Epithelia Negative    57. Cockroach, German Negative    58. Mouse Negative    59. Tobacco Leaf Negative          Intradermal - 04/23/20 1731    Time Antigen Placed 1732    Allergen Manufacturer Lavella Hammock    Number of Test 12    Intradermal Select    Control Negative    [place] Negative    Johnson Negative    7 Grass 3+    Ragweed mix Negative    Weed mix Negative    Mold 2 Negative    Mold 3 Negative  Cat 2+    Dog Negative    Cockroach Negative    Mite mix Negative           Medication List:  Current Outpatient Medications  Medication Sig Dispense Refill  . albuterol (PROAIR HFA) 108 (90 Base) MCG/ACT inhaler Inhale 4 puffs into the lungs every 4 (four) hours as needed for wheezing or shortness of breath. 2 puffs every 6 hrs as needed (Patient taking differently: Inhale 2 puffs into the lungs every 4 (four) hours as needed for wheezing or shortness of breath. ) 1 Inhaler 2  . azelastine (ASTELIN) 0.1 % nasal spray Place 1 spray into both nostrils 2 (two) times daily. Use in each nostril as directed (Patient taking differently: Place 1 spray into both nostrils 2 (two) times daily as needed for allergies. Use in each nostril as directed) 90 mL 1  . Budesonide-Formoterol Fumarate (SYMBICORT IN) Inhale 1 puff into the lungs in the morning and at bedtime.    . cetirizine (ZYRTEC) 10 MG tablet Take 10 mg by mouth daily.    . Cholecalciferol (VITAMIN D) 2000 units CAPS Take 4,000 Units by mouth daily.     . indapamide (LOZOL) 2.5 MG tablet Take 2.5 mg by mouth daily.     Marland Kitchen ipratropium (ATROVENT) 0.03 % nasal spray Place 1 spray into both nostrils 2 (two) times daily as needed for rhinitis.   6  . montelukast (SINGULAIR) 10 MG tablet Take 10 mg by mouth at bedtime.     Vladimir Faster Glycol-Propyl Glycol (SYSTANE) 0.4-0.3 % SOLN Place 2 drops into both eyes daily as needed (for dry eyes).    . sertraline (ZOLOFT) 50 MG tablet Take 50 mg by mouth daily.     .  simvastatin (ZOCOR) 40 MG tablet Take 40 mg by mouth at bedtime.     . Fluticasone Propionate (XHANCE) 93 MCG/ACT EXHU Place 2 sprays into both nostrils 2 (two) times daily. (Patient not taking: Reported on 04/23/2020) 32 mL 5   No current facility-administered medications for this visit.   Allergies: Allergies  Allergen Reactions  . Lisinopril Swelling and Other (See Comments)    Swollen lips  . Sulfa Antibiotics Hives   I reviewed his past medical history, social history, family history, and environmental history and no significant changes have been reported from his previous visit.  Review of Systems  Constitutional: Negative for appetite change, chills, fever and unexpected weight change.  HENT: Negative for congestion, rhinorrhea and sneezing.   Eyes: Negative for itching.  Respiratory: Negative for cough, chest tightness, shortness of breath and wheezing.   Cardiovascular: Negative for chest pain.  Gastrointestinal: Negative for abdominal pain.  Genitourinary: Negative for difficulty urinating.  Skin: Negative for rash.  Allergic/Immunologic: Positive for environmental allergies.  Neurological: Negative for headaches.   Objective: BP 124/78   Pulse 74   Temp (!) 97.2 F (36.2 C) (Other (Comment))   Resp 16   Ht 5' 9.8" (1.773 m)   Wt (!) 314 lb (142.4 kg)   SpO2 96%   BMI 45.31 kg/m  Body mass index is 45.31 kg/m. Physical Exam Vitals and nursing note reviewed.  Constitutional:      Appearance: Normal appearance. He is well-developed.  HENT:     Head: Normocephalic and atraumatic.     Right Ear: Tympanic membrane and external ear normal.     Left Ear: Tympanic membrane and external ear normal.     Nose: Nose normal.     Mouth/Throat:  Mouth: Mucous membranes are moist.     Pharynx: Oropharynx is clear.  Eyes:     Conjunctiva/sclera: Conjunctivae normal.  Cardiovascular:     Rate and Rhythm: Normal rate and regular rhythm.     Heart sounds: Normal heart  sounds. No murmur heard.  No friction rub. No gallop.   Pulmonary:     Effort: Pulmonary effort is normal.     Breath sounds: Normal breath sounds. No wheezing, rhonchi or rales.  Musculoskeletal:     Cervical back: Neck supple.  Skin:    General: Skin is warm.     Findings: No rash.  Neurological:     Mental Status: He is alert and oriented to person, place, and time.  Psychiatric:        Behavior: Behavior normal.    Previous notes and tests were reviewed. The plan was reviewed with the patient/family, and all questions/concerned were addressed.  It was my pleasure to see Erik Wiggins today and participate in his care. Please feel free to contact me with any questions or concerns.  Sincerely,  Rexene Alberts, DO Allergy & Immunology  [facility] office: [place] office: [phone]

## 2020-04-23 ENCOUNTER — Ambulatory Visit (INDEPENDENT_AMBULATORY_CARE_PROVIDER_SITE_OTHER): Payer: Medicare Other | Admitting: Allergy

## 2020-04-23 ENCOUNTER — Other Ambulatory Visit: Payer: Self-pay

## 2020-04-23 ENCOUNTER — Encounter: Payer: Self-pay | Admitting: Allergy

## 2020-04-23 VITALS — BP 124/78 | HR 74 | Temp 97.2°F | Resp 16 | Ht 69.8 in | Wt 314.0 lb

## 2020-04-23 DIAGNOSIS — J3089 Other allergic rhinitis: Secondary | ICD-10-CM

## 2020-04-23 DIAGNOSIS — Z8709 Personal history of other diseases of the respiratory system: Secondary | ICD-10-CM

## 2020-04-23 DIAGNOSIS — J4540 Moderate persistent asthma, uncomplicated: Secondary | ICD-10-CM | POA: Diagnosis not present

## 2020-04-23 NOTE — Patient Instructions (Addendum)
Today's skin testing showed: Positive to grass, trees, mold, cat.   Environmental allergies:  Start environmental control measures as below.   Continue Xhance 2 sprays per nostril twice a day.   Sample given.  Please call your insurance to make sure the cost is $600. Apparently the drug was approved. I will check on any assistance programs and will let you know if there are any for Medicare patients.  If too expensive, may go back to Nasacort 1 spray per nostril twice a day.   Nasal saline spray (i.e., Simply Saline) or nasal saline lavage (i.e., NeilMed) is recommended as needed and prior to medicated nasal sprays.  May use over the counter antihistamines such as Zyrtec (cetirizine) 10mg  daily as needed.  Continue with montelukast 10mg  daily.  Continue environmental control measures.   If above treatment does not help then we can discuss starting allergy injections - read handout.   Asthma: . Daily controller medication(s): Symbicort 45mcg 1 puff twice a day and rinse mouth after each use.  . During upper respiratory infections/asthma flares: start Symbicort 2 puffs twice a day for 1-2 weeks until your breathing symptoms return to baseline.  . May use albuterol rescue inhaler 2 puffs every 4 to 6 hours as needed for shortness of breath, chest tightness, coughing, and wheezing. May use albuterol rescue inhaler 2 puffs 5 to 15 minutes prior to strenuous physical activities. Monitor frequency of use.  . Asthma control goals:  o Full participation in all desired activities (may need albuterol before activity) o Albuterol use two times or less a week on average (not counting use with activity) o Cough interfering with sleep two times or less a month o Oral steroids no more than once a year o No hospitalizations  Follow up in 4-6 months or sooner if needed.   Reducing Pollen Exposure . Pollen seasons: trees (spring), grass (summer) and ragweed/weeds (fall). Marland Kitchen Keep windows closed in  your home and car to lower pollen exposure.  Susa Simmonds air conditioning in the bedroom and throughout the house if possible.  . Avoid going out in dry windy days - especially early morning. . Pollen counts are highest between 5 - 10 AM and on dry, hot and windy days.  . Save outside activities for late afternoon or after a heavy rain, when pollen levels are lower.  . Avoid mowing of grass if you have grass pollen allergy. Marland Kitchen Be aware that pollen can also be transported indoors on people and pets.  . Dry your clothes in an automatic dryer rather than hanging them outside where they might collect pollen.  . Rinse hair and eyes before bedtime. Pet Allergen Avoidance: . Contrary to popular opinion, there are no "hypoallergenic" breeds of dogs or cats. That is because people are not allergic to an animal's hair, but to an allergen found in the animal's saliva, dander (dead skin flakes) or urine. Pet allergy symptoms typically occur within minutes. For some people, symptoms can build up and become most severe 8 to 12 hours after contact with the animal. People with severe allergies can experience reactions in public places if dander has been transported on the pet owners' clothing. Marland Kitchen Keeping an animal outdoors is only a partial solution, since homes with pets in the yard still have higher concentrations of animal allergens. . Before getting a pet, ask your allergist to determine if you are allergic to animals. If your pet is already considered part of your family, try to minimize contact  and keep the pet out of the bedroom and other rooms where you spend a great deal of time. . As with dust mites, vacuum carpets often or replace carpet with a hardwood floor, tile or linoleum. . High-efficiency particulate air (HEPA) cleaners can reduce allergen levels over time. . While dander and saliva are the source of cat and dog allergens, urine is the source of allergens from rabbits, hamsters, mice and [place] pigs; so  ask a non-allergic family member to clean the animal's cage. . If you have a pet allergy, talk to your allergist about the potential for allergy immunotherapy (allergy shots). This strategy can often provide long-term relief. Mold Control . Mold and fungi can grow on a variety of surfaces provided certain temperature and moisture conditions exist.  . Outdoor molds grow on plants, decaying vegetation and soil. The major outdoor mold, Alternaria and Cladosporium, are found in very high numbers during hot and dry conditions. Generally, a late summer - fall peak is seen for common outdoor fungal spores. Rain will temporarily lower outdoor mold spore count, but counts rise rapidly when the rainy period ends. . The most important indoor molds are Aspergillus and Penicillium. Dark, humid and poorly ventilated basements are ideal sites for mold growth. The next most common sites of mold growth are the bathroom and the kitchen. Outdoor (Seasonal) Mold Control . Use air conditioning and keep windows closed. . Avoid exposure to decaying vegetation. Marland Kitchen Avoid leaf raking. . Avoid grain handling. . Consider wearing a face mask if working in moldy areas.  Indoor (Perennial) Mold Control  . Maintain humidity below 50%. . Get rid of mold growth on hard surfaces with water, detergent and, if necessary, 5% bleach (do not mix with other cleaners). Then dry the area completely. If mold covers an area more than 10 square feet, consider hiring an indoor environmental professional. . For clothing, washing with soap and water is best. If moldy items cannot be cleaned and dried, throw them away. . Remove sources e.g. contaminated carpets. . Repair and seal leaking roofs or pipes. Using dehumidifiers in damp basements may be helpful, but empty the water and clean units regularly to prevent mildew from forming. All rooms, especially basements, bathrooms and kitchens, require ventilation and cleaning to deter mold and mildew  growth. Avoid carpeting on concrete or damp floors, and storing items in damp areas.

## 2020-04-23 NOTE — Assessment & Plan Note (Signed)
Past history - Perennial rhinitis symptoms for many years but worse the past 3-4 months. 2018 skin testing was positive to weeds, mold, dust mites, cat and cockroach. Used to be on allergy injections with some benefit. He also had polyps on imaging in the past but never had surgery for this per patient report.  Interim history - Truett Perna is working better than Nasacort but cost is $600/month which he can't afford.  Today's skin testing showed: Positive to grass, trees, mold, cat.   Start environmental control measures as below.   Continue Xhance 2 sprays per nostril twice a day.   Sample given.  Please call your insurance to make sure the cost is $600. Apparently the drug was approved. I will check on any assistance programs and will let you know if there are any for Medicare patients.  If too expensive, may go back to Nasacort 1 spray per nostril twice a day.   Nasal saline spray (i.e., Simply Saline) or nasal saline lavage (i.e., NeilMed) is recommended as needed and prior to medicated nasal sprays.  May use over the counter antihistamines such as Zyrtec (cetirizine) 10mg  daily as needed.  Continue with montelukast 10mg  daily.  Continue environmental control measures.   If above treatment does not help then we can discuss starting allergy injections - handout given.

## 2020-04-23 NOTE — Assessment & Plan Note (Signed)
Past history - Diagnosed with asthma over 12 years ago. Currently on unknown dose of Symbicort 1 puff BID and doing well. 2021 spirometry was normal. Interim history - Doing well with below regimen. . Daily controller medication(s): Symbicort 41mcg 1 puff twice a day and rinse mouth after each use.  . During upper respiratory infections/asthma flares: start Symbicort 2 puffs twice a day for 1-2 weeks until your breathing symptoms return to baseline.  . May use albuterol rescue inhaler 2 puffs every 4 to 6 hours as needed for shortness of breath, chest tightness, coughing, and wheezing. May use albuterol rescue inhaler 2 puffs 5 to 15 minutes prior to strenuous physical activities. Monitor frequency of use.

## 2020-04-23 NOTE — Assessment & Plan Note (Signed)
Past history - History of nasal polyps but no surgical intervention per patient report.  Continue Xhance 2 sprays per nostril twice a day as above.  If no improvement may need ENT referral as polyp was not visible on today's physical exam.  If polyps are still present, Dupixent injections maybe a good option as patient is hesitant about polypectomy surgery.

## 2020-04-24 DIAGNOSIS — M25562 Pain in left knee: Secondary | ICD-10-CM | POA: Diagnosis not present

## 2020-05-01 DIAGNOSIS — M25562 Pain in left knee: Secondary | ICD-10-CM | POA: Diagnosis not present

## 2020-05-08 DIAGNOSIS — S83242D Other tear of medial meniscus, current injury, left knee, subsequent encounter: Secondary | ICD-10-CM | POA: Diagnosis not present

## 2020-05-08 DIAGNOSIS — M1712 Unilateral primary osteoarthritis, left knee: Secondary | ICD-10-CM | POA: Diagnosis not present

## 2020-05-13 NOTE — Telephone Encounter (Signed)
Per insurance this is approved already.

## 2020-05-13 NOTE — Telephone Encounter (Signed)
Patient requested a tier lowering for cost savings on [employer]. The medication is approved by his plan, but he would like a little less out of pocket. I have faxed the form to his pharmacy benefit plan.

## 2020-05-14 ENCOUNTER — Telehealth: Payer: Self-pay | Admitting: *Deleted

## 2020-05-14 DIAGNOSIS — M94262 Chondromalacia, left knee: Secondary | ICD-10-CM | POA: Diagnosis not present

## 2020-05-14 DIAGNOSIS — M84352A Stress fracture, left femur, initial encounter for fracture: Secondary | ICD-10-CM | POA: Diagnosis not present

## 2020-05-14 DIAGNOSIS — Y999 Unspecified external cause status: Secondary | ICD-10-CM | POA: Diagnosis not present

## 2020-05-14 DIAGNOSIS — M2242 Chondromalacia patellae, left knee: Secondary | ICD-10-CM | POA: Diagnosis not present

## 2020-05-14 DIAGNOSIS — S83232A Complex tear of medial meniscus, current injury, left knee, initial encounter: Secondary | ICD-10-CM | POA: Diagnosis not present

## 2020-05-14 DIAGNOSIS — X58XXXA Exposure to other specified factors, initial encounter: Secondary | ICD-10-CM | POA: Diagnosis not present

## 2020-05-14 DIAGNOSIS — G8918 Other acute postprocedural pain: Secondary | ICD-10-CM | POA: Diagnosis not present

## 2020-05-14 NOTE — Telephone Encounter (Signed)
Error

## 2020-05-17 DIAGNOSIS — G4733 Obstructive sleep apnea (adult) (pediatric): Secondary | ICD-10-CM | POA: Diagnosis not present

## 2020-05-22 DIAGNOSIS — M25562 Pain in left knee: Secondary | ICD-10-CM | POA: Diagnosis not present

## 2020-05-27 DIAGNOSIS — M25562 Pain in left knee: Secondary | ICD-10-CM | POA: Diagnosis not present

## 2020-05-30 DIAGNOSIS — M25562 Pain in left knee: Secondary | ICD-10-CM | POA: Diagnosis not present

## 2020-06-04 DIAGNOSIS — M25562 Pain in left knee: Secondary | ICD-10-CM | POA: Diagnosis not present

## 2020-06-07 DIAGNOSIS — M25562 Pain in left knee: Secondary | ICD-10-CM | POA: Diagnosis not present

## 2020-06-10 DIAGNOSIS — M25562 Pain in left knee: Secondary | ICD-10-CM | POA: Diagnosis not present

## 2020-06-17 DIAGNOSIS — M25562 Pain in left knee: Secondary | ICD-10-CM | POA: Diagnosis not present

## 2020-06-26 DIAGNOSIS — M25562 Pain in left knee: Secondary | ICD-10-CM | POA: Diagnosis not present

## 2020-07-09 DIAGNOSIS — Z23 Encounter for immunization: Secondary | ICD-10-CM | POA: Diagnosis not present

## 2020-08-21 DIAGNOSIS — M1712 Unilateral primary osteoarthritis, left knee: Secondary | ICD-10-CM | POA: Diagnosis not present

## 2020-08-28 DIAGNOSIS — M25562 Pain in left knee: Secondary | ICD-10-CM | POA: Diagnosis not present

## 2020-08-28 DIAGNOSIS — M1712 Unilateral primary osteoarthritis, left knee: Secondary | ICD-10-CM | POA: Diagnosis not present

## 2020-09-04 DIAGNOSIS — M1712 Unilateral primary osteoarthritis, left knee: Secondary | ICD-10-CM | POA: Diagnosis not present

## 2020-09-09 DIAGNOSIS — I129 Hypertensive chronic kidney disease with stage 1 through stage 4 chronic kidney disease, or unspecified chronic kidney disease: Secondary | ICD-10-CM | POA: Diagnosis not present

## 2020-09-09 DIAGNOSIS — E785 Hyperlipidemia, unspecified: Secondary | ICD-10-CM | POA: Diagnosis not present

## 2020-09-09 DIAGNOSIS — Z125 Encounter for screening for malignant neoplasm of prostate: Secondary | ICD-10-CM | POA: Diagnosis not present

## 2020-09-09 DIAGNOSIS — M8580 Other specified disorders of bone density and structure, unspecified site: Secondary | ICD-10-CM | POA: Diagnosis not present

## 2020-09-09 DIAGNOSIS — E559 Vitamin D deficiency, unspecified: Secondary | ICD-10-CM | POA: Diagnosis not present

## 2020-09-09 DIAGNOSIS — I10 Essential (primary) hypertension: Secondary | ICD-10-CM | POA: Diagnosis not present

## 2020-09-12 DIAGNOSIS — J45909 Unspecified asthma, uncomplicated: Secondary | ICD-10-CM | POA: Diagnosis not present

## 2020-09-12 DIAGNOSIS — E291 Testicular hypofunction: Secondary | ICD-10-CM | POA: Diagnosis not present

## 2020-09-12 DIAGNOSIS — E785 Hyperlipidemia, unspecified: Secondary | ICD-10-CM | POA: Diagnosis not present

## 2020-09-12 DIAGNOSIS — F3341 Major depressive disorder, recurrent, in partial remission: Secondary | ICD-10-CM | POA: Diagnosis not present

## 2020-09-12 DIAGNOSIS — G4733 Obstructive sleep apnea (adult) (pediatric): Secondary | ICD-10-CM | POA: Diagnosis not present

## 2020-09-12 DIAGNOSIS — Z0001 Encounter for general adult medical examination with abnormal findings: Secondary | ICD-10-CM | POA: Diagnosis not present

## 2020-09-12 DIAGNOSIS — M8580 Other specified disorders of bone density and structure, unspecified site: Secondary | ICD-10-CM | POA: Diagnosis not present

## 2020-09-12 DIAGNOSIS — I10 Essential (primary) hypertension: Secondary | ICD-10-CM | POA: Diagnosis not present

## 2020-09-12 DIAGNOSIS — N529 Male erectile dysfunction, unspecified: Secondary | ICD-10-CM | POA: Diagnosis not present

## 2020-09-12 DIAGNOSIS — J309 Allergic rhinitis, unspecified: Secondary | ICD-10-CM | POA: Diagnosis not present

## 2020-09-24 DIAGNOSIS — M8589 Other specified disorders of bone density and structure, multiple sites: Secondary | ICD-10-CM | POA: Diagnosis not present

## 2020-09-24 DIAGNOSIS — M85852 Other specified disorders of bone density and structure, left thigh: Secondary | ICD-10-CM | POA: Diagnosis not present

## 2020-09-26 DIAGNOSIS — E785 Hyperlipidemia, unspecified: Secondary | ICD-10-CM | POA: Diagnosis not present

## 2020-09-26 DIAGNOSIS — E559 Vitamin D deficiency, unspecified: Secondary | ICD-10-CM | POA: Diagnosis not present

## 2020-09-30 DIAGNOSIS — I2510 Atherosclerotic heart disease of native coronary artery without angina pectoris: Secondary | ICD-10-CM | POA: Diagnosis not present

## 2020-10-21 DIAGNOSIS — M1712 Unilateral primary osteoarthritis, left knee: Secondary | ICD-10-CM | POA: Diagnosis not present

## 2020-10-21 DIAGNOSIS — E669 Obesity, unspecified: Secondary | ICD-10-CM | POA: Diagnosis not present

## 2020-10-22 ENCOUNTER — Ambulatory Visit: Payer: Medicare Other | Admitting: Allergy

## 2020-10-24 DIAGNOSIS — I10 Essential (primary) hypertension: Secondary | ICD-10-CM | POA: Diagnosis not present

## 2020-10-24 DIAGNOSIS — I2510 Atherosclerotic heart disease of native coronary artery without angina pectoris: Secondary | ICD-10-CM | POA: Diagnosis not present

## 2020-10-24 DIAGNOSIS — E559 Vitamin D deficiency, unspecified: Secondary | ICD-10-CM | POA: Diagnosis not present

## 2020-10-24 DIAGNOSIS — M8580 Other specified disorders of bone density and structure, unspecified site: Secondary | ICD-10-CM | POA: Diagnosis not present

## 2020-11-05 DIAGNOSIS — I2510 Atherosclerotic heart disease of native coronary artery without angina pectoris: Secondary | ICD-10-CM | POA: Diagnosis not present

## 2020-11-06 DIAGNOSIS — E559 Vitamin D deficiency, unspecified: Secondary | ICD-10-CM | POA: Diagnosis not present

## 2020-11-06 DIAGNOSIS — I10 Essential (primary) hypertension: Secondary | ICD-10-CM | POA: Diagnosis not present

## 2020-11-06 DIAGNOSIS — I2510 Atherosclerotic heart disease of native coronary artery without angina pectoris: Secondary | ICD-10-CM | POA: Diagnosis not present

## 2020-11-25 ENCOUNTER — Ambulatory Visit: Payer: Self-pay

## 2020-11-25 ENCOUNTER — Encounter: Payer: Self-pay | Admitting: Orthopaedic Surgery

## 2020-11-25 ENCOUNTER — Ambulatory Visit (INDEPENDENT_AMBULATORY_CARE_PROVIDER_SITE_OTHER): Payer: Medicare Other | Admitting: Orthopaedic Surgery

## 2020-11-25 VITALS — Ht 69.8 in | Wt 319.0 lb

## 2020-11-25 DIAGNOSIS — G8929 Other chronic pain: Secondary | ICD-10-CM

## 2020-11-25 DIAGNOSIS — M1712 Unilateral primary osteoarthritis, left knee: Secondary | ICD-10-CM | POA: Insufficient documentation

## 2020-11-25 DIAGNOSIS — M25562 Pain in left knee: Secondary | ICD-10-CM

## 2020-11-25 MED ORDER — LIDOCAINE HCL 1 % IJ SOLN
3.0000 mL | INTRAMUSCULAR | Status: AC | PRN
  Administered 2020-11-25: 3 mL

## 2020-11-25 MED ORDER — METHYLPREDNISOLONE ACETATE 40 MG/ML IJ SUSP
40.0000 mg | INTRAMUSCULAR | Status: AC | PRN
  Administered 2020-11-25: 40 mg via INTRA_ARTICULAR

## 2020-11-25 NOTE — Progress Notes (Signed)
Office Visit Note   Patient: Erik Wiggins           Date of Birth: 1946/03/08           MRN: [id number] Visit Date: 11/25/2020              Requested by: Deland Pretty, MD [address] [place],  [place] [address] PCP: Deland Pretty, MD   Assessment & Plan: Visit Diagnoses:  1. Chronic pain of left knee   2. Unilateral primary osteoarthritis, left knee   3. Severe obesity (BMI >= 40) (HCC)     Plan: We did talk in detail about the situation was of the left knee.  He understands that given his BMI of 46 that we cannot proceed with knee replacement surgery until it is lower.  I did provide a steroid injection in his left knee to help temporize the pain while he works on activity modification and weight loss.  All questions and concerns were answered and addressed.The patient meets the AMA guidelines for Morbid (severe) obesity with a BMI > 40.0 and I have recommended weight loss. I would like to see him back in 3 months for repeat weight and BMI calculation.  Follow-Up Instructions: Return in about 3 months (around 02/25/2021).   Orders:  No orders of the defined types were placed in this encounter.  No orders of the defined types were placed in this encounter.     Procedures: Large Joint Inj: L knee on 11/25/2020 10:13 AM Indications: diagnostic evaluation and pain Details: 22 G 1.5 in needle, superolateral approach  Arthrogram: No  Medications: 3 mL lidocaine 1 %; 40 mg methylPREDNISolone acetate 40 MG/ML Outcome: tolerated well, no immediate complications Procedure, treatment alternatives, risks and benefits explained, specific risks discussed. Consent was given by the patient. Immediately prior to procedure a time out was called to verify the correct patient, procedure, equipment, support staff and site/side marked as required. Patient was prepped and draped in the usual sterile fashion.       Clinical Data: No additional  findings.   Subjective: Chief Complaint  Patient presents with  . Left Knee - Pain  The patient is very pleasant 75 year old gentleman who comes to see me for second opinion as it relates to his left knee.  He does have known and well-documented osteoarthritis of the left knee.  He saw Dr. Theda Sers over the [facility] who did perform an arthroscopic intervention on that knee back in October.  He has also had hyaluronic acid injections.  He is now diabetic.  He did gain weight after the pandemic and his BMI is up to 46.  He came to see me for second opinion about his knee but also to have me resume care for his knee for hopefully potentially replacing his knee at some point when his BMI is below 40.  He has been referred to a weight loss clinic over at [facility] and is waiting to hear from that clinic.  He denies any other acute change in medical status.  Most of his pain is in the medial aspect of his knee.  He denies being a diabetic.  HPI  Review of Systems He currently denies any headache, chest pain, shortness of breath, fever, chills, nausea, vomiting  Objective: Vital Signs: Ht 5' 9.8" (1.773 m)   Wt (!) 319 lb (144.7 kg)   BMI 46.03 kg/m   Physical Exam He is alert and orient x3 and in no acute  distress Ortho Exam Examination of his left knee shows just a mild effusion.  There is varus malalignment.  He has excellent range of motion of the knee but there is patellofemoral crepitation and significant medial joint line tenderness. Specialty Comments:  No specialty comments available.  Imaging: No results found. X-rays that accompany him as well as an MRI report of the left knee show moderate to severe osteoarthritis mainly involving the medial compartment of his knee.  There is also associated degenerative meniscal tearing.  PMFS History: Patient Active Problem List   Diagnosis Date Noted  . Unilateral primary osteoarthritis, left knee 11/25/2020  . History of nasal  polyp 03/26/2020  . Umbilical hernia 85/63/1497  . Moderate persistent asthma without complication 02/63/7858  . Asthma, mild intermittent, well-controlled 12/22/2014  . Other allergic rhinitis 06/21/2014  . Nasal polyps 06/21/2014  . Obesity 06/21/2014  . OSA (obstructive sleep apnea) 06/05/2014   Past Medical History:  Diagnosis Date  . Allergic rhinitis   . Asthma   . Asthma, mild intermittent, well-controlled 12/22/2014  . High cholesterol   . Hypertension   . Low testosterone   . Moderate persistent asthma, uncomplicated 8/50/2774  . Nasal polyps 06/21/2014   Demonstrated on previous sinus CT by report. No history of aspirin intolerance   . Obesity 06/21/2014  . OSA (obstructive sleep apnea)    OSA, uses CPAP nightly  . Perennial allergic rhinitis 06/21/2014   Allergy vaccine started 2012 in [place]   . Umbilical hernia     Family History  Problem Relation Age of Onset  . Healthy Mother   . Heart attack Father 77  . AAA (abdominal aortic aneurysm) Father   . Healthy Son   . Healthy Son   . Healthy Son   . Cancer Maternal Grandfather   . Multiple sclerosis Son        youngest  . Allergic rhinitis Neg Hx   . Angioedema Neg Hx   . Asthma Neg Hx   . Atopy Neg Hx   . Eczema Neg Hx   . Immunodeficiency Neg Hx   . Urticaria Neg Hx     Past Surgical History:  Procedure Laterality Date  . CARDIAC CATHETERIZATION    . COLONSCOPY    . EYE SURGERY     cataracts  . INSERTION OF MESH N/A 02/11/2018   Procedure: INSERTION OF MESH;  Surgeon: Armandina Gemma, MD;  Location: [facility];  Service: General;  Laterality: N/A;  . UMBILICAL HERNIA REPAIR N/A 02/11/2018   Procedure: OPEN REPAIR UMBILICAL HERNIA;  Surgeon: Armandina Gemma, MD;  Location: [facility];  Service: General;  Laterality: N/A;   Social History   Occupational History  . Occupation: Hotel manager  Tobacco Use  . Smoking status: Former Smoker    Years: 1.00    Types:  Cigars    Quit date: 07/21/1995    Years since quitting: 25.3  . Smokeless tobacco: Never Used  Vaping Use  . Vaping Use: Never used  Substance and Sexual Activity  . Alcohol use: Yes    Alcohol/week: 0.0 standard drinks    Comment: social  . Drug use: No  . Sexual activity: Not on file    Comment: MARRIED

## 2020-12-10 ENCOUNTER — Emergency Department (HOSPITAL_COMMUNITY)
Admission: EM | Admit: 2020-12-10 | Discharge: 2020-12-11 | Disposition: A | Discharge status: Home / Self Care | Payer: Medicare Other | Attending: Emergency Medicine | Admitting: Emergency Medicine

## 2020-12-10 ENCOUNTER — Other Ambulatory Visit: Payer: Self-pay

## 2020-12-10 ENCOUNTER — Encounter (HOSPITAL_COMMUNITY): Payer: Self-pay

## 2020-12-10 DIAGNOSIS — X58XXXA Exposure to other specified factors, initial encounter: Secondary | ICD-10-CM | POA: Diagnosis not present

## 2020-12-10 DIAGNOSIS — J45909 Unspecified asthma, uncomplicated: Secondary | ICD-10-CM | POA: Insufficient documentation

## 2020-12-10 DIAGNOSIS — Z87891 Personal history of nicotine dependence: Secondary | ICD-10-CM | POA: Diagnosis not present

## 2020-12-10 DIAGNOSIS — S0592XA Unspecified injury of left eye and orbit, initial encounter: Secondary | ICD-10-CM | POA: Diagnosis present

## 2020-12-10 DIAGNOSIS — S0502XA Injury of conjunctiva and corneal abrasion without foreign body, left eye, initial encounter: Secondary | ICD-10-CM | POA: Diagnosis not present

## 2020-12-10 DIAGNOSIS — I10 Essential (primary) hypertension: Secondary | ICD-10-CM | POA: Diagnosis not present

## 2020-12-10 MED ORDER — FLUORESCEIN SODIUM 1 MG OP STRP
1.0000 | ORAL_STRIP | Freq: Once | OPHTHALMIC | Status: AC
  Administered 2020-12-11: 1 via OPHTHALMIC
  Filled 2020-12-10: qty 1

## 2020-12-10 MED ORDER — TETRACAINE HCL 0.5 % OP SOLN
2.0000 [drp] | Freq: Once | OPHTHALMIC | Status: AC
  Administered 2020-12-11: 2 [drp] via OPHTHALMIC
  Filled 2020-12-10: qty 4

## 2020-12-10 NOTE — ED Provider Notes (Signed)
Emergency Medicine Provider Triage Evaluation Note  Erik Wiggins , a 75 y.o. male  was evaluated in triage.  Pt complains of pain in the left eye.  He was mowing the lawn on Saturday and felt something fly into his eye.  No vision change.  He reports that he had a film on the eye that was "gunk" that he could wipe off.  That has improved.  He states that he is here because he would have expected improvement by now.  Unknown last Tdap.    Review of Systems  Positive: Left eye pain Negative: Left eye vision change.   Physical Exam  BP (!) 184/99 (BP Location: Left Arm)   Pulse 64   Temp 98.1 F (36.7 C) (Oral)   Resp 19   Ht 5\' 10"  (1.778 m)   Wt 136.1 kg   SpO2 96%   BMI 43.05 kg/m  Gen:   Awake, no distress   Resp:  Normal effort  MSK:   Moves extremities without difficulty  Other:  Left eye vision is grossly intact.   Medical Decision Making  Medically screening exam initiated at 10:58 PM.  Appropriate orders placed.  [name] was informed that the remainder of the evaluation will be completed by another provider, this initial triage assessment does not replace that evaluation, and the importance of remaining in the ED until their evaluation is complete.     Ollen Gross 12/10/20 2301    Milton Ferguson, MD 12/10/20 2314

## 2020-12-10 NOTE — ED Triage Notes (Signed)
Pt c/o L eye pain since Saturday. States he was mowing the lawn and felt something go into his eye. Denies any visual changes. Tried to rinse out eye but still having pain

## 2020-12-11 DIAGNOSIS — S0502XA Injury of conjunctiva and corneal abrasion without foreign body, left eye, initial encounter: Secondary | ICD-10-CM | POA: Diagnosis not present

## 2020-12-11 MED ORDER — ERYTHROMYCIN 5 MG/GM OP OINT: TOPICAL_OINTMENT | OPHTHALMIC | 0 refills | Status: DC

## 2020-12-11 NOTE — ED Provider Notes (Signed)
[facility] DEPT Provider Note   CSN: [id number] Arrival date & time: 12/10/20  2232     History Chief Complaint  Patient presents with  . Eye Pain    Erik Wiggins is a 75 y.o. male.  The history is provided by the patient.  Eye Pain  He has history of hypertension, hyperlipidemia, asthma and comes in because of injury to his left eye.  He was mowing his lawn 3 days ago when something flew into his left eye.  It felt like something was in his eye but he thought it would get better.  He continues to have the foreign body sensation in the left eye and also feels like there is a film over his eye.  He denies other injury.  He is up-to-date on tetanus immunizations.   Past Medical History:  Diagnosis Date  . Allergic rhinitis   . Asthma   . Asthma, mild intermittent, well-controlled 12/22/2014  . High cholesterol   . Hypertension   . Low testosterone   . Moderate persistent asthma, uncomplicated 3/38/2505  . Nasal polyps 06/21/2014   Demonstrated on previous sinus CT by report. No history of aspirin intolerance   . Obesity 06/21/2014  . OSA (obstructive sleep apnea)    OSA, uses CPAP nightly  . Perennial allergic rhinitis 06/21/2014   Allergy vaccine started 2012 in [place]   . Umbilical hernia     Patient Active Problem List   Diagnosis Date Noted  . Unilateral primary osteoarthritis, left knee 11/25/2020  . History of nasal polyp 03/26/2020  . Umbilical hernia 39/76/7341  . Moderate persistent asthma without complication 93/79/0240  . Asthma, mild intermittent, well-controlled 12/22/2014  . Other allergic rhinitis 06/21/2014  . Nasal polyps 06/21/2014  . Obesity 06/21/2014  . OSA (obstructive sleep apnea) 06/05/2014    Past Surgical History:  Procedure Laterality Date  . CARDIAC CATHETERIZATION    . COLONSCOPY    . EYE SURGERY     cataracts  . INSERTION OF MESH N/A 02/11/2018   Procedure: INSERTION OF MESH;  Surgeon: Armandina Gemma,  MD;  Location: [facility];  Service: General;  Laterality: N/A;  . UMBILICAL HERNIA REPAIR N/A 02/11/2018   Procedure: OPEN REPAIR UMBILICAL HERNIA;  Surgeon: Armandina Gemma, MD;  Location: [facility];  Service: General;  Laterality: N/A;       Family History  Problem Relation Age of Onset  . Healthy Mother   . Heart attack Father 81  . AAA (abdominal aortic aneurysm) Father   . Healthy Son   . Healthy Son   . Healthy Son   . Cancer Maternal Grandfather   . Multiple sclerosis Son        youngest  . Allergic rhinitis Neg Hx   . Angioedema Neg Hx   . Asthma Neg Hx   . Atopy Neg Hx   . Eczema Neg Hx   . Immunodeficiency Neg Hx   . Urticaria Neg Hx     Social History   Tobacco Use  . Smoking status: Former Smoker    Years: 1.00    Types: Cigars    Quit date: 07/21/1995    Years since quitting: 25.4  . Smokeless tobacco: Never Used  Vaping Use  . Vaping Use: Never used  Substance Use Topics  . Alcohol use: Yes    Alcohol/week: 0.0 standard drinks    Comment: social  . Drug use: No    Home Medications Prior to  Admission medications   Medication Sig Start Date End Date Taking? Authorizing Provider  albuterol (PROAIR HFA) 108 (90 Base) MCG/ACT inhaler Inhale 4 puffs into the lungs every 4 (four) hours as needed for wheezing or shortness of breath. 2 puffs every 6 hrs as needed Patient taking differently: Inhale 2 puffs into the lungs every 4 (four) hours as needed for wheezing or shortness of breath.  08/12/16   Valentina Shaggy, MD  azelastine (ASTELIN) 0.1 % nasal spray Place 1 spray into both nostrils 2 (two) times daily. Use in each nostril as directed Patient taking differently: Place 1 spray into both nostrils 2 (two) times daily as needed for allergies. Use in each nostril as directed 11/03/16   Valentina Shaggy, MD  Budesonide-Formoterol Fumarate (SYMBICORT IN) Inhale 1 puff into the lungs in the morning and at bedtime.    [provider]  cetirizine (ZYRTEC) 10 MG tablet Take 10 mg by mouth daily.    [provider]  Cholecalciferol (VITAMIN D) 2000 units CAPS Take 4,000 Units by mouth daily.     [provider]  Fluticasone Propionate (XHANCE) 93 MCG/ACT EXHU Place 2 sprays into both nostrils 2 (two) times daily. Patient not taking: Reported on 04/23/2020 03/26/20   Garnet Sierras, DO  indapamide (LOZOL) 2.5 MG tablet Take 2.5 mg by mouth daily.  05/31/14   [provider]  ipratropium (ATROVENT) 0.03 % nasal spray Place 1 spray into both nostrils 2 (two) times daily as needed for rhinitis.  05/16/14   [provider]  montelukast (SINGULAIR) 10 MG tablet Take 10 mg by mouth at bedtime.  05/31/14   [provider]  Polyethyl Glycol-Propyl Glycol (SYSTANE) 0.4-0.3 % SOLN Place 2 drops into both eyes daily as needed (for dry eyes).    [provider]  sertraline (ZOLOFT) 50 MG tablet Take 50 mg by mouth daily.  10/29/16   [provider]  simvastatin (ZOCOR) 40 MG tablet Take 40 mg by mouth at bedtime.  05/31/14   [provider]    Allergies    Lisinopril and Sulfa antibiotics  Review of Systems   Review of Systems  Eyes: Positive for pain.  All other systems reviewed and are negative.   Physical Exam Updated Vital Signs BP (!) 184/99 (BP Location: Left Arm)   Pulse 64   Temp 98.1 F (36.7 C) (Oral)   Resp 19   Ht 5\' 10"  (1.778 m)   Wt 136.1 kg   SpO2 96%   BMI 43.05 kg/m   Physical Exam Vitals and nursing note reviewed.   75 year old male, resting comfortably and in no acute distress. Vital signs are significant for elevated blood pressure. Oxygen saturation is 96%, which is normal. Head is normocephalic and atraumatic. PERRLA, EOMI. No foreign body is seen.  Upper and lower conjunctival sacs were inspected after eversion of lids and no foreign bodies are seen.  Anterior chamber is clear.  There is no injection of the conjunctiva.   Slight mucoid drainage is noted.  I stained with fluorescein and examined with Woods lamp and there is a small corneal abrasion noted at the 10 o'clock position. Neck is nontender and supple without adenopathy or JVD. Back is nontender and there is no CVA tenderness. Lungs are clear without rales, wheezes, or rhonchi. Chest is nontender. Heart has regular rate and rhythm without murmur. Abdomen is soft, flat, nontender without masses or hepatosplenomegaly and peristalsis is normoactive. Extremities have 1+  edema, full range of motion is present. Skin is warm and dry without rash. Neurologic: Mental status is normal, cranial nerves are intact, moves all extremities equally.  ED Results / Procedures / Treatments    Procedures Procedures   Medications Ordered in ED Medications  fluorescein ophthalmic strip 1 strip (has no administration in time range)  tetracaine (PONTOCAINE) 0.5 % ophthalmic solution 2 drop (has no administration in time range)    ED Course  I have reviewed the triage vital signs and the nursing notes.  Pertinent labs & imaging results that were available during my care of the patient were reviewed by me and considered in my medical decision making (see chart for details).   MDM Rules/Calculators/A&P                         Corneal abrasion of the left eye.  They did appears to be healing appropriately.  He is discharged with prescription for erythromycin ointment, advised to follow-up with ophthalmology if not improved in another 2 days.  Visual acuity is 20/40 in the right eye, 20/70 in the left eye, 20/40 with both eyes.  Final Clinical Impression(s) / ED Diagnoses Final diagnoses:  Abrasion of left cornea, initial encounter  Elevated blood pressure reading with diagnosis of hypertension    Rx / DC Orders ED Discharge Orders         Ordered    erythromycin ophthalmic ointment        12/11/20 9166           Delora Fuel, MD [birth year] [phone]

## 2020-12-11 NOTE — Discharge Instructions (Signed)
If your eye is not feeling significantly better by Thursday afternoon, call the ophthalmologist for an appointment on Friday.

## 2021-01-02 DIAGNOSIS — H10013 Acute follicular conjunctivitis, bilateral: Secondary | ICD-10-CM | POA: Diagnosis not present

## 2021-01-29 ENCOUNTER — Ambulatory Visit: Payer: Medicare Other | Admitting: Cardiovascular Disease

## 2021-02-05 ENCOUNTER — Ambulatory Visit (INDEPENDENT_AMBULATORY_CARE_PROVIDER_SITE_OTHER): Payer: Medicare Other | Admitting: Cardiovascular Disease

## 2021-02-05 ENCOUNTER — Other Ambulatory Visit: Payer: Self-pay

## 2021-02-05 ENCOUNTER — Encounter: Payer: Self-pay | Admitting: Cardiovascular Disease

## 2021-02-05 VITALS — BP 140/72 | HR 64 | Ht 70.0 in | Wt 318.0 lb

## 2021-02-05 DIAGNOSIS — Z6841 Body Mass Index (BMI) 40.0 and over, adult: Secondary | ICD-10-CM | POA: Diagnosis not present

## 2021-02-05 DIAGNOSIS — I2584 Coronary atherosclerosis due to calcified coronary lesion: Secondary | ICD-10-CM

## 2021-02-05 DIAGNOSIS — R0609 Other forms of dyspnea: Secondary | ICD-10-CM

## 2021-02-05 DIAGNOSIS — I2510 Atherosclerotic heart disease of native coronary artery without angina pectoris: Secondary | ICD-10-CM | POA: Diagnosis not present

## 2021-02-05 DIAGNOSIS — R0600 Dyspnea, unspecified: Secondary | ICD-10-CM | POA: Diagnosis not present

## 2021-02-05 MED ORDER — ROSUVASTATIN CALCIUM 20 MG PO TABS: 20.0000 mg | ORAL_TABLET | Freq: Every day | ORAL | 3 refills | Status: AC

## 2021-02-05 NOTE — Patient Instructions (Addendum)
Medication Instructions:  Your physician has recommended you make the following change in your medication:  STOP Simvastatin START Rosuvastatin 20 mg once daily  *If you need a refill on your cardiac medications before your next appointment, please call your pharmacy*   Lab Work: Your physician recommends that you return for lab work in: 3 months on Friday Oct. 21  You may come in anytime on this date between the hours of 7:30 am and 4:45 pm  You will need to FAST for this appointment - nothing to eat or drink after midnight the night before except water.  If you have labs (blood work) drawn today and your tests are completely normal, you will receive your results only by: [facility] [place] (if you have MyChart) OR A paper copy in the mail If you have any lab test that is abnormal or we need to change your treatment, we will call you to review the results.   Testing/Procedures: None Ordered   Follow-Up: At [facility], you and your health needs are our priority.  As part of our continuing mission to provide you with exceptional heart care, we have created designated Provider Care Teams.  These Care Teams include your primary Cardiologist (physician) and Advanced Practice Providers (APPs -  Physician Assistants and Nurse Practitioners) who all work together to provide you with the care you need, when you need it.    Your next appointment:   1 year(s)  The format for your next appointment:   In Person  Provider:   You may see Mertie Moores, MD or one of the following Advanced Practice Providers on your designated Care Team:   Richardson Dopp, PA-C [place], [place]   Other Instructions Mediterranean Diet A Mediterranean diet refers to food and lifestyle choices that are based on the traditions of countries located on the [employer]. This way of eating has been shown to help prevent certain conditions and improve outcomes forpeople who have chronic diseases, like kidney  disease and heart disease. What are tips for following this plan? Lifestyle Cook and eat meals together with your family, when possible. Drink enough fluid to keep your urine clear or pale yellow. Be physically active every day. This includes: Aerobic exercise like running or swimming. Leisure activities like gardening, walking, or housework. Get 7-8 hours of sleep each night. If recommended by your health care provider, drink red wine in moderation. This means 1 glass a day for nonpregnant women and 2 glasses a day for men. A glass of wine equals 5 oz (150 mL). Reading food labels  Check the serving size of packaged foods. For foods such as rice and pasta, the serving size refers to the amount of cooked product, not dry. Check the total fat in packaged foods. Avoid foods that have saturated fat or trans fats. Check the ingredients list for added sugars, such as corn syrup.  Shopping At the grocery store, buy most of your food from the areas near the walls of the store. This includes: Fresh fruits and vegetables (produce). Grains, beans, nuts, and seeds. Some of these may be available in unpackaged forms or large amounts (in bulk). Fresh seafood. Poultry and eggs. Low-fat dairy products. Buy whole ingredients instead of prepackaged foods. Buy fresh fruits and vegetables in-season from local farmers markets. Buy frozen fruits and vegetables in resealable bags. If you do not have access to quality fresh seafood, buy precooked frozen shrimp or canned fish, such as tuna, salmon, or sardines. Buy small amounts  of raw or cooked vegetables, salads, or olives from the deli or salad bar at your store. Stock your pantry so you always have certain foods on hand, such as olive oil, canned tuna, canned tomatoes, rice, pasta, and beans. Cooking Cook foods with extra-virgin olive oil instead of using butter or other vegetable oils. Have meat as a side dish, and have vegetables or grains as your main  dish. This means having meat in small portions or adding small amounts of meat to foods like pasta or stew. Use beans or vegetables instead of meat in common dishes like chili or lasagna. Experiment with different cooking methods. Try roasting or broiling vegetables instead of steaming or sauteing them. Add frozen vegetables to soups, stews, pasta, or rice. Add nuts or seeds for added healthy fat at each meal. You can add these to yogurt, salads, or vegetable dishes. Marinate fish or vegetables using olive oil, lemon juice, garlic, and fresh herbs. Meal planning  Plan to eat 1 vegetarian meal one day each week. Try to work up to 2 vegetarian meals, if possible. Eat seafood 2 or more times a week. Have healthy snacks readily available, such as: Vegetable sticks with hummus. Greek yogurt. Fruit and nut trail mix. Eat balanced meals throughout the week. This includes: Fruit: 2-3 servings a day Vegetables: 4-5 servings a day Low-fat dairy: 2 servings a day Fish, poultry, or lean meat: 1 serving a day Beans and legumes: 2 or more servings a week Nuts and seeds: 1-2 servings a day Whole grains: 6-8 servings a day Extra-virgin olive oil: 3-4 servings a day Limit red meat and sweets to only a few servings a month  What are my food choices? Mediterranean diet Recommended Grains: Whole-grain pasta. Brown rice. Bulgar wheat. Polenta. Couscous. Whole-wheat bread. [place]. Vegetables: Artichokes. Beets. Broccoli. Cabbage. Carrots. Eggplant. Green beans. Chard. Kale. Spinach. Onions. Leeks. Peas. Squash. Tomatoes. Peppers. Radishes. Fruits: Apples. Apricots. Avocado. Berries. Bananas. Cherries. Dates. Figs. Grapes. Lemons. Melon. Oranges. Peaches. Plums. Pomegranate. Meats and other protein foods: Beans. Almonds. Sunflower seeds. Pine nuts. Peanuts. [place]. Salmon. Scallops. Shrimp. [place]. Tilapia. Clams. Oysters. Eggs. Dairy: Low-fat milk. Cheese. Greek yogurt. Beverages: Water. Red wine.  Herbal tea. Fats and oils: Extra virgin olive oil. Avocado oil. Grape seed oil. Sweets and desserts: [place] yogurt with honey. Baked apples. Poached pears. Trail mix. Seasoning and other foods: Basil. Cilantro. Coriander. Cumin. Mint. Parsley. Sage. Rosemary. Tarragon. Garlic. Oregano. Thyme. Pepper. Balsalmic vinegar. Tahini. Hummus. Tomato sauce. Olives. Mushrooms. Limit these Grains: Prepackaged pasta or rice dishes. Prepackaged cereal with added sugar. Vegetables: Deep fried potatoes (french fries). Fruits: Fruit canned in syrup. Meats and other protein foods: Beef. Pork. Lamb. Poultry with skin. Hot dogs. Berniece Salines. Dairy: Ice cream. Sour cream. Whole milk. Beverages: Juice. Sugar-sweetened soft drinks. Beer. Liquor and spirits. Fats and oils: Butter. Canola oil. Vegetable oil. Beef fat (tallow). Lard. Sweets and desserts: Cookies. Cakes. Pies. Candy. Seasoning and other foods: Mayonnaise. Premade sauces and marinades. The items listed may not be a complete list. Talk with your dietitian aboutwhat dietary choices are right for you. Summary The Mediterranean diet includes both food and lifestyle choices. Eat a variety of fresh fruits and vegetables, beans, nuts, seeds, and whole grains. Limit the amount of red meat and sweets that you eat. Talk with your health care provider about whether it is safe for you to drink red wine in moderation. This means 1 glass a day for nonpregnant women and 2 glasses a day for men. A  glass of wine equals 5 oz (150 mL). This information is not intended to replace advice given to you by your health care provider. Make sure you discuss any questions you have with your healthcare provider. Document Revised: 03/05/2016 Document Reviewed: 02/27/2016 Elsevier Patient Education  [place].

## 2021-02-05 NOTE — Progress Notes (Signed)
Cardiology Office Note:    Date:  02/05/2021   ID:  Erik Wiggins, DOB Sep 12, 1945, MRN [id number]  PCP:  Deland Pretty, MD   [facility] Providers Cardiologist:  Meda Coffee / Arthur Speagle  Click to update primary MD,subspecialty MD or APP then REFRESH:1}    Referring MD: Deland Pretty, MD   Chief Complaint  Patient presents with   Coronary Artery Disease    History of Present Illness:    Erik Wiggins is a 75 y.o. male with a hx of morbid obesity, HTN, HLD  He was sent for a coronary calcium score in May, 2022.  The total coronary calcium score is 553 which is between the 50th and 75thpercentile for a male patient this age  We were asked to see him by Dr. Shelia Media for his coronary artery calcification   Had a heart cath years ago at [facility] ( reportedly normal )   No CP Some dyspnea - which he attributes to his weight   Labs from dr. Shelia Media reviewed. from April, 2022 Liver enzymes are normal.   HDL is 38.   Total cholesterol is 147.   Triglyceride level is 182.   LDL is 78.  Wt today is 318 lbs.    Past Medical History:  Diagnosis Date   Allergic rhinitis    Androgen deficiency    Asthma    Asthma, mild intermittent, well-controlled 12/22/2014   Erectile dysfunction    High cholesterol    Hyperlipemia    Hypertension    Low testosterone    Major depressive disorder, recurrent episode, in partial remission ([place])    Metatarsalgia    Moderate persistent asthma, uncomplicated 15/17/6160   Nasal polyps 06/21/2014   Demonstrated on previous sinus CT by report. No history of aspirin intolerance    Obesity 06/21/2014   Onychomycosis    OSA (obstructive sleep apnea)    OSA, uses CPAP nightly   Perennial allergic rhinitis 06/21/2014   Allergy vaccine started 2012 in [place]    Umbilical hernia    Vitamin D deficiency     Past Surgical History:  Procedure Laterality Date   CARDIAC CATHETERIZATION     COLONSCOPY     EYE SURGERY     cataracts    INSERTION OF MESH N/A 02/11/2018   Procedure: INSERTION OF MESH;  Surgeon: Armandina Gemma, MD;  Location: [place];  Service: General;  Laterality: N/A;   UMBILICAL HERNIA REPAIR N/A 02/11/2018   Procedure: OPEN REPAIR UMBILICAL HERNIA;  Surgeon: Armandina Gemma, MD;  Location: [place];  Service: General;  Laterality: N/A;    Current Medications: Current Meds  Medication Sig   albuterol (PROAIR HFA) 108 (90 Base) MCG/ACT inhaler Inhale 4 puffs into the lungs every 4 (four) hours as needed for wheezing or shortness of breath. 2 puffs every 6 hrs as needed   aspirin 81 MG chewable tablet Chew 81 mg by mouth daily.   azelastine (ASTELIN) 0.1 % nasal spray Place 1 spray into both nostrils 2 (two) times daily. Use in each nostril as directed   Budesonide-Formoterol Fumarate (SYMBICORT IN) Inhale 1 puff into the lungs in the morning and at bedtime.   cetirizine (ZYRTEC) 10 MG tablet Take 10 mg by mouth daily.   Cholecalciferol (VITAMIN D) 2000 units CAPS Take 4,000 Units by mouth daily.    indapamide (LOZOL) 2.5 MG tablet Take 2.5 mg by mouth daily.    ipratropium (ATROVENT) 0.03 % nasal spray Place 1 spray  into both nostrils 2 (two) times daily as needed for rhinitis.    montelukast (SINGULAIR) 10 MG tablet Take 10 mg by mouth at bedtime.    Polyethyl Glycol-Propyl Glycol 0.4-0.3 % SOLN Place 2 drops into both eyes daily as needed (for dry eyes).   rosuvastatin (CRESTOR) 20 MG tablet Take 1 tablet (20 mg total) by mouth daily.   sertraline (ZOLOFT) 50 MG tablet Take 50 mg by mouth daily.    [DISCONTINUED] simvastatin (ZOCOR) 40 MG tablet Take 40 mg by mouth at bedtime.      Allergies:   Lisinopril, Sulfa antibiotics, and Sulfacetamide sodium   Social History   Socioeconomic History   Marital status: Married    Spouse name: Not on file   Number of children: 3   Years of education: Not on file   Highest education level: Not on file  Occupational History    Occupation: Hotel manager  Tobacco Use   Smoking status: Former    Types: Cigars    Quit date: 07/21/1995    Years since quitting: 25.5   Smokeless tobacco: Never  Vaping Use   Vaping Use: Never used  Substance and Sexual Activity   Alcohol use: Not Currently    Comment: social   Drug use: No   Sexual activity: Not on file    Comment: MARRIED  Other Topics Concern   Not on file  Social History Narrative   Not on file   Social Determinants of Health   Financial Resource Strain: Not on file  Food Insecurity: Not on file  Transportation Needs: Not on file  Physical Activity: Not on file  Stress: Not on file  Social Connections: Not on file     Family History: The patient's family history includes AAA (abdominal aortic aneurysm) in his father; Cancer in his maternal grandfather; Healthy in his mother, son, son, and son; Heart attack (age of onset: 39) in his father; Multiple sclerosis in his son. There is no history of Allergic rhinitis, Angioedema, Asthma, Atopy, Eczema, Immunodeficiency, or Urticaria.  ROS:   Please see the history of present illness.     All other systems reviewed and are negative.  EKGs/Labs/Other Studies Reviewed:    The following studies were reviewed today:   EKG: February 05, 2021:   NSR at 78.   Nonspecific ST abnormalities - new since previous ECG in February 09 2018   Recent Labs: No results found for requested labs within last 8760 hours.  Recent Lipid Panel No results found for: CHOL, TRIG, HDL, CHOLHDL, VLDL, LDLCALC, LDLDIRECT   Risk Assessment/Calculations:           Physical Exam:    VS:  BP 140/72   Pulse 64   Ht 5\' 10"  (1.778 m)   Wt (!) 318 lb (144.2 kg)   SpO2 97%   BMI 45.63 kg/m     Wt Readings from Last 3 Encounters:  02/05/21 (!) 318 lb (144.2 kg)  12/10/20 300 lb (136.1 kg)  11/25/20 (!) 319 lb (144.7 kg)     GEN: Morbidly obese, elderly gentleman, no acute distress HEENT: Normal NECK: No JVD; No  carotid bruits LYMPHATICS: No lymphadenopathy CARDIAC: RRR, no murmurs, rubs, gallops RESPIRATORY:  Clear to auscultation without rales, wheezing or rhonchi  ABDOMEN: Soft, non-tender, non-distended MUSCULOSKELETAL:  No edema; No deformity  SKIN: Warm and dry NEUROLOGIC:  Alert and oriented x 3 PSYCHIATRIC:  Normal affect   ASSESSMENT:    1. Coronary artery calcification  2. Class 3 severe obesity due to excess calories with serious comorbidity and body mass index (BMI) of 45.0 to 49.9 in adult ([place])   3. DOE (dyspnea on exertion)    PLAN:    In order of problems listed above:  Coronary artery calcifications: He presents with coronary artery calcifications.  He does not have any symptoms that sound like angina.  He does have some worsening dyspnea but he attributes this to his weight gain.  He is gained quite a bit of weight over COVID.  I encouraged him to continue with his weight loss efforts.  He he will be going to [facility] for a weight loss program.  Advised him to give me a call if he has any chest pressure or chest pain. He had a heart catheterization years ago at in [place] that was normal. [place] study in July, 2017 was also normal.  2.  Hyperlipidemia: His lipids look okay on simvastatin but I would like to get his LDL lower.  His LDL goal should be between 50 and 70.  We will discontinue the simvastatin and start him on rosuvastatin 20 mg a day.  We will check lipids, ALT, basic metabolic profile in 3 months.  I will see him back in 1 year.        Medication Adjustments/Labs and Tests Ordered: Current medicines are reviewed at length with the patient today.  Concerns regarding medicines are outlined above.  Orders Placed This Encounter  Procedures   Lipid Profile   Basic Metabolic Panel (BMET)   Hepatic function panel   EKG 12-Lead    Meds ordered this encounter  Medications   rosuvastatin (CRESTOR) 20 MG tablet    Sig: Take 1 tablet (20 mg total)  by mouth daily.    Dispense:  90 tablet    Refill:  3     Patient Instructions  Medication Instructions:  Your physician has recommended you make the following change in your medication:  STOP Simvastatin START Rosuvastatin 20 mg once daily  *If you need a refill on your cardiac medications before your next appointment, please call your pharmacy*   Lab Work: Your physician recommends that you return for lab work in: 3 months on Friday Oct. 21  You may come in anytime on this date between the hours of 7:30 am and 4:45 pm  You will need to FAST for this appointment - nothing to eat or drink after midnight the night before except water.  If you have labs (blood work) drawn today and your tests are completely normal, you will receive your results only by: [place] (if you have MyChart) OR A paper copy in the mail If you have any lab test that is abnormal or we need to change your treatment, we will call you to review the results.   Testing/Procedures: None Ordered   Follow-Up: At [facility], you and your health needs are our priority.  As part of our continuing mission to provide you with exceptional heart care, we have created designated Provider Care Teams.  These Care Teams include your primary Cardiologist (physician) and Advanced Practice Providers (APPs -  Physician Assistants and Nurse Practitioners) who all work together to provide you with the care you need, when you need it.    Your next appointment:   1 year(s)  The format for your next appointment:   In Person  Provider:   You may see Mertie Moores, MD or one of the following Advanced  Practice Providers on your designated Care Team:   Richardson Dopp, PA-C [place], [place]   Other Instructions Mediterranean Diet A Mediterranean diet refers to food and lifestyle choices that are based on the traditions of countries located on the [employer]. This way of eating has been shown to help prevent  certain conditions and improve outcomes forpeople who have chronic diseases, like kidney disease and heart disease. What are tips for following this plan? Lifestyle Cook and eat meals together with your family, when possible. Drink enough fluid to keep your urine clear or pale yellow. Be physically active every day. This includes: Aerobic exercise like running or swimming. Leisure activities like gardening, walking, or housework. Get 7-8 hours of sleep each night. If recommended by your health care provider, drink red wine in moderation. This means 1 glass a day for nonpregnant women and 2 glasses a day for men. A glass of wine equals 5 oz (150 mL). Reading food labels  Check the serving size of packaged foods. For foods such as rice and pasta, the serving size refers to the amount of cooked product, not dry. Check the total fat in packaged foods. Avoid foods that have saturated fat or trans fats. Check the ingredients list for added sugars, such as corn syrup.  Shopping At the grocery store, buy most of your food from the areas near the walls of the store. This includes: Fresh fruits and vegetables (produce). Grains, beans, nuts, and seeds. Some of these may be available in unpackaged forms or large amounts (in bulk). Fresh seafood. Poultry and eggs. Low-fat dairy products. Buy whole ingredients instead of prepackaged foods. Buy fresh fruits and vegetables in-season from local farmers markets. Buy frozen fruits and vegetables in resealable bags. If you do not have access to quality fresh seafood, buy precooked frozen shrimp or canned fish, such as tuna, salmon, or sardines. Buy small amounts of raw or cooked vegetables, salads, or olives from the deli or salad bar at your store. Stock your pantry so you always have certain foods on hand, such as olive oil, canned tuna, canned tomatoes, rice, pasta, and beans. Cooking Cook foods with extra-virgin olive oil instead of using butter or  other vegetable oils. Have meat as a side dish, and have vegetables or grains as your main dish. This means having meat in small portions or adding small amounts of meat to foods like pasta or stew. Use beans or vegetables instead of meat in common dishes like chili or lasagna. Experiment with different cooking methods. Try roasting or broiling vegetables instead of steaming or sauteing them. Add frozen vegetables to soups, stews, pasta, or rice. Add nuts or seeds for added healthy fat at each meal. You can add these to yogurt, salads, or vegetable dishes. Marinate fish or vegetables using olive oil, lemon juice, garlic, and fresh herbs. Meal planning  Plan to eat 1 vegetarian meal one day each week. Try to work up to 2 vegetarian meals, if possible. Eat seafood 2 or more times a week. Have healthy snacks readily available, such as: Vegetable sticks with hummus. Greek yogurt. Fruit and nut trail mix. Eat balanced meals throughout the week. This includes: Fruit: 2-3 servings a day Vegetables: 4-5 servings a day Low-fat dairy: 2 servings a day Fish, poultry, or lean meat: 1 serving a day Beans and legumes: 2 or more servings a week Nuts and seeds: 1-2 servings a day Whole grains: 6-8 servings a day Extra-virgin olive oil: 3-4 servings a day Limit  red meat and sweets to only a few servings a month  What are my food choices? Mediterranean diet Recommended Grains: Whole-grain pasta. Brown rice. Bulgar wheat. Polenta. Couscous. Whole-wheat bread. [place]. Vegetables: Artichokes. Beets. Broccoli. Cabbage. Carrots. Eggplant. Green beans. Chard. Kale. Spinach. Onions. Leeks. Peas. Squash. Tomatoes. Peppers. Radishes. Fruits: Apples. Apricots. Avocado. Berries. Bananas. Cherries. Dates. Figs. Grapes. Lemons. Melon. Oranges. Peaches. Plums. Pomegranate. Meats and other protein foods: Beans. Almonds. Sunflower seeds. Pine nuts. Peanuts. [place]. Salmon. Scallops. Shrimp. [place]. Tilapia.  Clams. Oysters. Eggs. Dairy: Low-fat milk. Cheese. Greek yogurt. Beverages: Water. Red wine. Herbal tea. Fats and oils: Extra virgin olive oil. Avocado oil. Grape seed oil. Sweets and desserts: [place] yogurt with honey. Baked apples. Poached pears. Trail mix. Seasoning and other foods: Basil. Cilantro. Coriander. Cumin. Mint. Parsley. Sage. Rosemary. Tarragon. Garlic. Oregano. Thyme. Pepper. Balsalmic vinegar. Tahini. Hummus. Tomato sauce. Olives. Mushrooms. Limit these Grains: Prepackaged pasta or rice dishes. Prepackaged cereal with added sugar. Vegetables: Deep fried potatoes (french fries). Fruits: Fruit canned in syrup. Meats and other protein foods: Beef. Pork. Lamb. Poultry with skin. Hot dogs. [place] Salines. Dairy: Ice cream. Sour cream. Whole milk. Beverages: Juice. Sugar-sweetened soft drinks. Beer. Liquor and spirits. Fats and oils: Butter. Canola oil. Vegetable oil. Beef fat (tallow). Lard. Sweets and desserts: Cookies. Cakes. Pies. Candy. Seasoning and other foods: Mayonnaise. Premade sauces and marinades. The items listed may not be a complete list. Talk with your dietitian aboutwhat dietary choices are right for you. Summary The Mediterranean diet includes both food and lifestyle choices. Eat a variety of fresh fruits and vegetables, beans, nuts, seeds, and whole grains. Limit the amount of red meat and sweets that you eat. Talk with your health care provider about whether it is safe for you to drink red wine in moderation. This means 1 glass a day for nonpregnant women and 2 glasses a day for men. A glass of wine equals 5 oz (150 mL). This information is not intended to replace advice given to you by your health care provider. Make sure you discuss any questions you have with your healthcare provider. Document Revised: 03/05/2016 Document Reviewed: 02/27/2016 Elsevier Patient Education  2020 [place], Mertie Moores, MD  02/05/2021 2:57 PM    [facility]

## 2021-02-26 ENCOUNTER — Ambulatory Visit: Payer: Medicare Other | Admitting: Orthopaedic Surgery

## 2021-02-27 DIAGNOSIS — H1045 Other chronic allergic conjunctivitis: Secondary | ICD-10-CM | POA: Diagnosis not present

## 2021-02-27 DIAGNOSIS — H04123 Dry eye syndrome of bilateral lacrimal glands: Secondary | ICD-10-CM | POA: Diagnosis not present

## 2021-03-26 DIAGNOSIS — H10023 Other mucopurulent conjunctivitis, bilateral: Secondary | ICD-10-CM | POA: Diagnosis not present

## 2021-03-26 DIAGNOSIS — S0502XA Injury of conjunctiva and corneal abrasion without foreign body, left eye, initial encounter: Secondary | ICD-10-CM | POA: Diagnosis not present

## 2021-03-27 DIAGNOSIS — H04123 Dry eye syndrome of bilateral lacrimal glands: Secondary | ICD-10-CM | POA: Diagnosis not present

## 2021-04-28 DIAGNOSIS — R635 Abnormal weight gain: Secondary | ICD-10-CM | POA: Diagnosis not present

## 2021-05-09 ENCOUNTER — Other Ambulatory Visit: Payer: Medicare Other

## 2021-05-14 DIAGNOSIS — S0502XA Injury of conjunctiva and corneal abrasion without foreign body, left eye, initial encounter: Secondary | ICD-10-CM | POA: Diagnosis not present

## 2021-05-14 DIAGNOSIS — H10013 Acute follicular conjunctivitis, bilateral: Secondary | ICD-10-CM | POA: Diagnosis not present

## 2021-05-17 DIAGNOSIS — H10023 Other mucopurulent conjunctivitis, bilateral: Secondary | ICD-10-CM | POA: Diagnosis not present

## 2021-05-20 DIAGNOSIS — Z8673 Personal history of transient ischemic attack (TIA), and cerebral infarction without residual deficits: Secondary | ICD-10-CM | POA: Insufficient documentation

## 2021-05-27 ENCOUNTER — Observation Stay (HOSPITAL_COMMUNITY)
Admission: EM | Admit: 2021-05-27 | Discharge: 2021-05-28 | Disposition: A | Discharge status: Home / Self Care | Payer: Medicare Other | Attending: Internal Medicine | Admitting: Internal Medicine

## 2021-05-27 ENCOUNTER — Encounter (HOSPITAL_COMMUNITY): Payer: Self-pay

## 2021-05-27 ENCOUNTER — Other Ambulatory Visit: Payer: Self-pay

## 2021-05-27 ENCOUNTER — Emergency Department (HOSPITAL_COMMUNITY): Payer: Medicare Other

## 2021-05-27 DIAGNOSIS — H04123 Dry eye syndrome of bilateral lacrimal glands: Secondary | ICD-10-CM | POA: Diagnosis not present

## 2021-05-27 DIAGNOSIS — G459 Transient cerebral ischemic attack, unspecified: Secondary | ICD-10-CM | POA: Diagnosis not present

## 2021-05-27 DIAGNOSIS — I6782 Cerebral ischemia: Secondary | ICD-10-CM | POA: Diagnosis not present

## 2021-05-27 DIAGNOSIS — Z87891 Personal history of nicotine dependence: Secondary | ICD-10-CM | POA: Diagnosis not present

## 2021-05-27 DIAGNOSIS — H5461 Unqualified visual loss, right eye, normal vision left eye: Secondary | ICD-10-CM | POA: Insufficient documentation

## 2021-05-27 DIAGNOSIS — I10 Essential (primary) hypertension: Secondary | ICD-10-CM | POA: Diagnosis not present

## 2021-05-27 DIAGNOSIS — H34233 Retinal artery branch occlusion, bilateral: Secondary | ICD-10-CM | POA: Diagnosis not present

## 2021-05-27 DIAGNOSIS — E66813 Obesity, class 3: Secondary | ICD-10-CM

## 2021-05-27 DIAGNOSIS — H43393 Other vitreous opacities, bilateral: Secondary | ICD-10-CM | POA: Diagnosis not present

## 2021-05-27 DIAGNOSIS — H34231 Retinal artery branch occlusion, right eye: Secondary | ICD-10-CM | POA: Diagnosis not present

## 2021-05-27 DIAGNOSIS — Z79899 Other long term (current) drug therapy: Secondary | ICD-10-CM | POA: Insufficient documentation

## 2021-05-27 DIAGNOSIS — R29898 Other symptoms and signs involving the musculoskeletal system: Secondary | ICD-10-CM | POA: Diagnosis not present

## 2021-05-27 DIAGNOSIS — H349 Unspecified retinal vascular occlusion: Secondary | ICD-10-CM

## 2021-05-27 DIAGNOSIS — Z7982 Long term (current) use of aspirin: Secondary | ICD-10-CM | POA: Diagnosis not present

## 2021-05-27 DIAGNOSIS — H5460 Unqualified visual loss, one eye, unspecified: Secondary | ICD-10-CM | POA: Diagnosis present

## 2021-05-27 DIAGNOSIS — J4520 Mild intermittent asthma, uncomplicated: Secondary | ICD-10-CM | POA: Diagnosis not present

## 2021-05-27 DIAGNOSIS — I6523 Occlusion and stenosis of bilateral carotid arteries: Secondary | ICD-10-CM | POA: Diagnosis not present

## 2021-05-27 DIAGNOSIS — R2689 Other abnormalities of gait and mobility: Secondary | ICD-10-CM | POA: Diagnosis not present

## 2021-05-27 DIAGNOSIS — G4733 Obstructive sleep apnea (adult) (pediatric): Secondary | ICD-10-CM | POA: Diagnosis present

## 2021-05-27 DIAGNOSIS — Z20822 Contact with and (suspected) exposure to covid-19: Secondary | ICD-10-CM | POA: Diagnosis not present

## 2021-05-27 DIAGNOSIS — J322 Chronic ethmoidal sinusitis: Secondary | ICD-10-CM | POA: Diagnosis not present

## 2021-05-27 DIAGNOSIS — Z961 Presence of intraocular lens: Secondary | ICD-10-CM | POA: Diagnosis not present

## 2021-05-27 DIAGNOSIS — G319 Degenerative disease of nervous system, unspecified: Secondary | ICD-10-CM | POA: Diagnosis not present

## 2021-05-27 DIAGNOSIS — Z8673 Personal history of transient ischemic attack (TIA), and cerebral infarction without residual deficits: Secondary | ICD-10-CM | POA: Diagnosis present

## 2021-05-27 DIAGNOSIS — I639 Cerebral infarction, unspecified: Secondary | ICD-10-CM

## 2021-05-27 LAB — CBC
HCT: 39.3 % (ref 39.0–52.0)
Hemoglobin: 12.8 g/dL — ABNORMAL LOW (ref 13.0–17.0)
MCH: 29.2 pg (ref 26.0–34.0)
MCHC: 32.6 g/dL (ref 30.0–36.0)
MCV: 89.5 fL (ref 80.0–100.0)
Platelets: 122 10*3/uL — ABNORMAL LOW (ref 150–400)
RBC: 4.39 MIL/uL (ref 4.22–5.81)
RDW: 15.9 % — ABNORMAL HIGH (ref 11.5–15.5)
WBC: 7.2 10*3/uL (ref 4.0–10.5)
nRBC: 0 % (ref 0.0–0.2)

## 2021-05-27 LAB — ETHANOL: Alcohol, Ethyl (B): <10 mg/dL (ref <10)

## 2021-05-27 LAB — I-STAT CHEM 8, ED
BUN: 12 mg/dL (ref 8–23)
Calcium, Ion: 1.22 mmol/L (ref 1.15–1.40)
Chloride: 104 mmol/L (ref 98–111)
Creatinine, Ser: 0.9 mg/dL (ref 0.61–1.24)
Glucose, Bld: 167 mg/dL — ABNORMAL HIGH (ref 70–99)
HCT: 38 % — ABNORMAL LOW (ref 39.0–52.0)
Hemoglobin: 12.9 g/dL — ABNORMAL LOW (ref 13.0–17.0)
Potassium: 3.6 mmol/L (ref 3.5–5.1)
Sodium: 142 mmol/L (ref 135–145)
TCO2: 27 mmol/L (ref 22–32)

## 2021-05-27 LAB — COMPREHENSIVE METABOLIC PANEL
ALT: 20 U/L (ref 0–44)
AST: 21 U/L (ref 15–41)
Albumin: 4.1 g/dL (ref 3.5–5.0)
Alkaline Phosphatase: 65 U/L (ref 38–126)
Anion gap: 8 (ref 5–15)
BUN: 13 mg/dL (ref 8–23)
CO2: 25 mmol/L (ref 22–32)
Calcium: 9.3 mg/dL (ref 8.9–10.3)
Chloride: 106 mmol/L (ref 98–111)
Creatinine, Ser: 0.9 mg/dL (ref 0.61–1.24)
GFR, Estimated: >60 mL/min (ref >60)
Glucose, Bld: 168 mg/dL — ABNORMAL HIGH (ref 70–99)
Potassium: 3.6 mmol/L (ref 3.5–5.1)
Sodium: 139 mmol/L (ref 135–145)
Total Bilirubin: 0.8 mg/dL (ref 0.3–1.2)
Total Protein: 7.4 g/dL (ref 6.5–8.1)

## 2021-05-27 LAB — HEMOGLOBIN A1C
Hgb A1c MFr Bld: 6 % — ABNORMAL HIGH (ref 4.8–5.6)
Mean Plasma Glucose: 125.5 mg/dL

## 2021-05-27 LAB — DIFFERENTIAL
Abs Immature Granulocytes: 0.04 10*3/uL (ref 0.00–0.07)
Basophils Absolute: 0 10*3/uL (ref 0.0–0.1)
Basophils Relative: 0 %
Eosinophils Absolute: 0.1 10*3/uL (ref 0.0–0.5)
Eosinophils Relative: 2 %
Immature Granulocytes: 1 %
Lymphocytes Relative: 25 %
Lymphs Abs: 1.8 10*3/uL (ref 0.7–4.0)
Monocytes Absolute: 0.3 10*3/uL (ref 0.1–1.0)
Monocytes Relative: 4 %
Neutro Abs: 5 10*3/uL (ref 1.7–7.7)
Neutrophils Relative %: 68 %

## 2021-05-27 LAB — PROTIME-INR
INR: 1 (ref 0.8–1.2)
Prothrombin Time: 13 seconds (ref 11.4–15.2)

## 2021-05-27 LAB — RESP PANEL BY RT-PCR (FLU A&B, COVID) ARPGX2
Influenza A by PCR: NEGATIVE
Influenza B by PCR: NEGATIVE
SARS Coronavirus 2 by RT PCR: NEGATIVE

## 2021-05-27 LAB — APTT: aPTT: 28 seconds (ref 24–36)

## 2021-05-27 IMAGING — MR MR ORBITS W/O CM
5 of 6 series · 36 of 48 positions shown · non-contrast
Comparison: None.

CLINICAL DATA: Initial evaluation for right-sided blurry vision.

EXAM:
MRI HEAD AND ORBITS WITHOUT CONTRAST
TECHNIQUE: Multiplanar, multi-echo pulse sequences of the brain and surrounding
structures were acquired without intravenous contrast. Multiplanar,
multi-echo pulse sequences of the orbits and surrounding structures
were acquired including fat saturation techniques, without
intravenous contrast administration.

[Series 6: T1 · sagittal · 5.0mm · 0.75mm/px · 8 of 24 slices shown (1 of 2)]
[im 1/24]
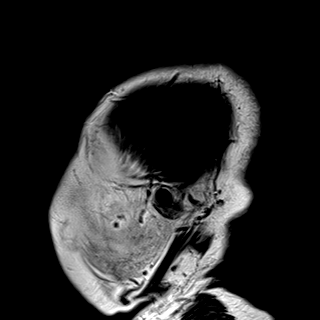
[im 4/24]
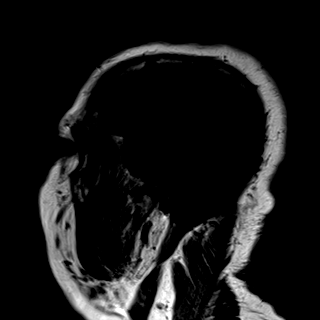
[im 7/24]
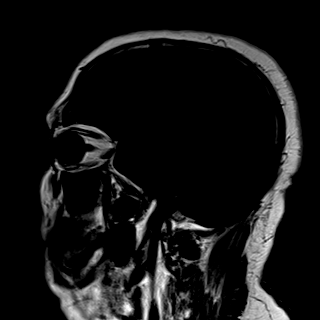
[im 10/24]
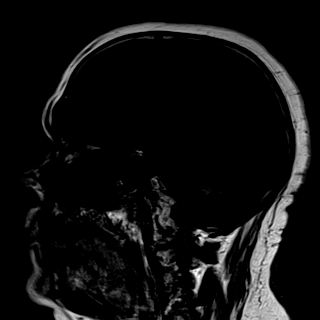
[im 14/24]
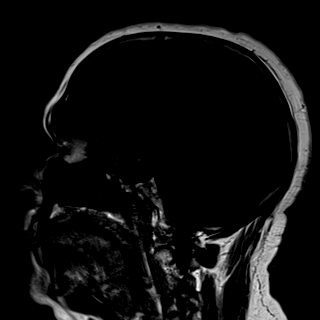
[im 17/24]
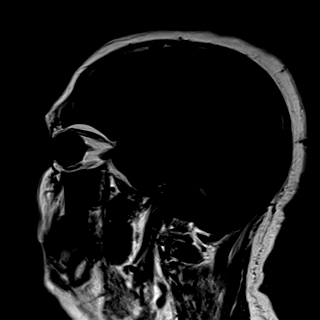
[im 20/24]
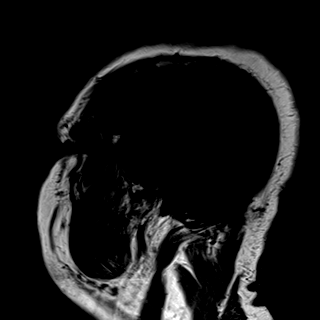
[im 24/24]
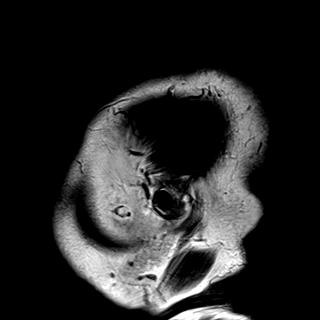

[Series 7: T2 fat-sat · axial · 3.0mm · 0.47mm/px · z∈[-49,+12]mm · 6 of 20 slices shown (1 of 3)]
[im 1/20]
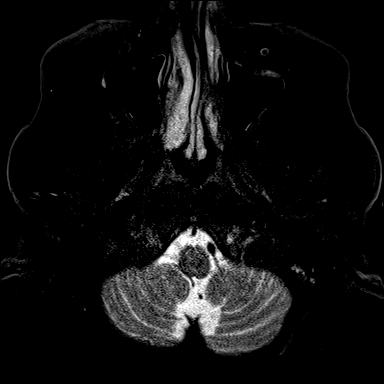
[im 4/20]
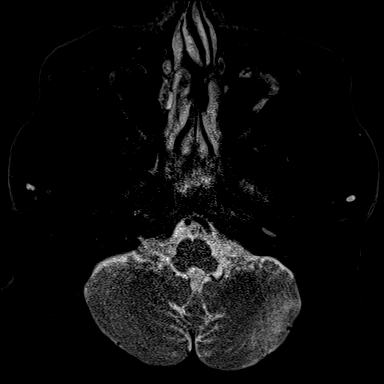
[im 8/20]
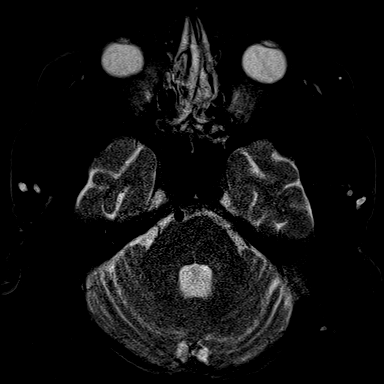
[im 12/20]
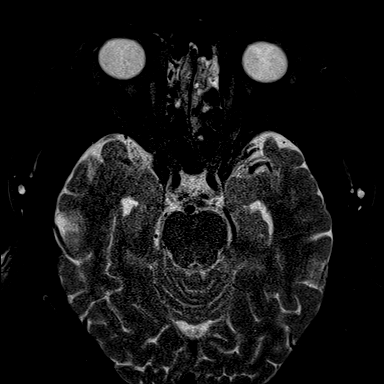
[im 16/20]
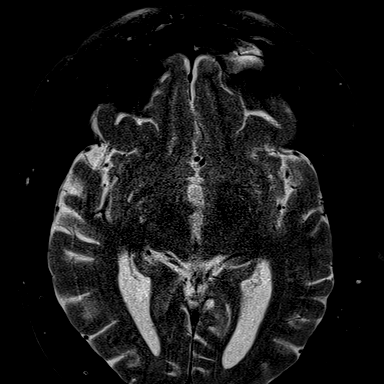
[im 20/20]
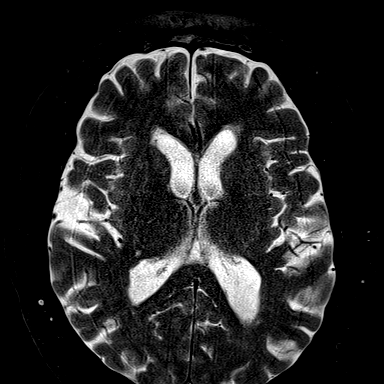

[Series 8: T1 · axial · 3.0mm · 0.56mm/px · z∈[-53,-20]mm · 4 of 22 slices shown (2 of 2)]
[im 1/22]
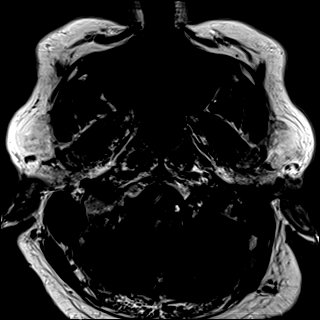
[im 4/22]
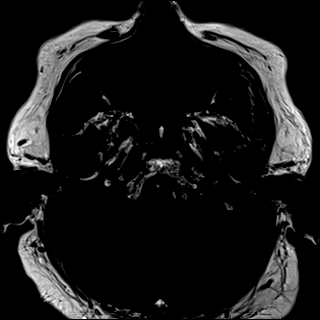
[im 8/22]
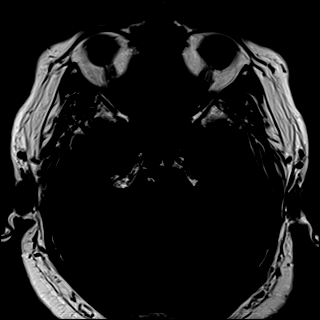
[im 11/22]
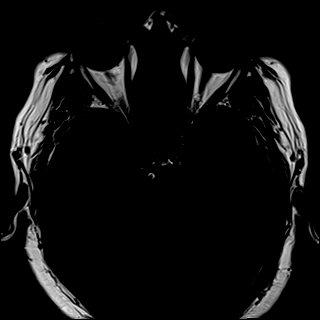

[Series 9: T2 fat-sat · coronal · 3.0mm · 0.47mm/px · 9 of 32 slices shown (2 of 3)]
[im 1/32]
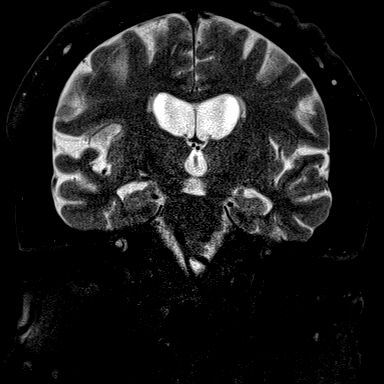
[im 4/32]
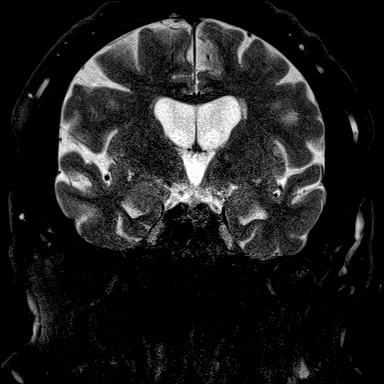
[im 8/32]
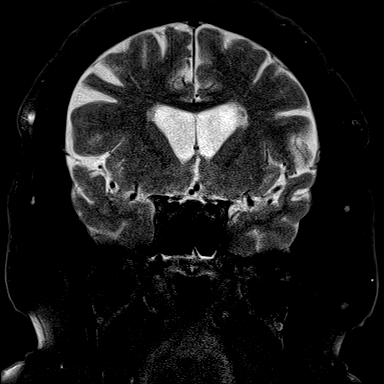
[im 12/32]
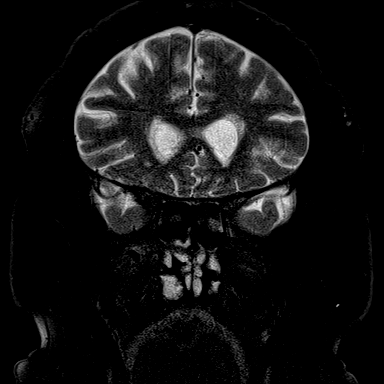
[im 16/32]
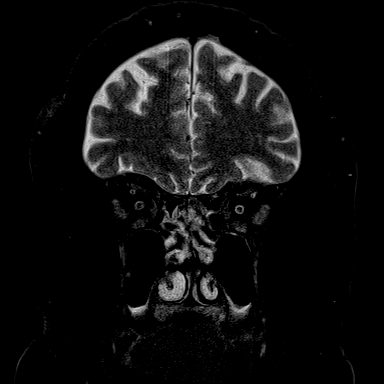
[im 20/32]
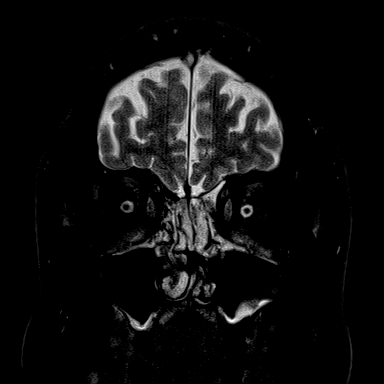
[im 24/32]
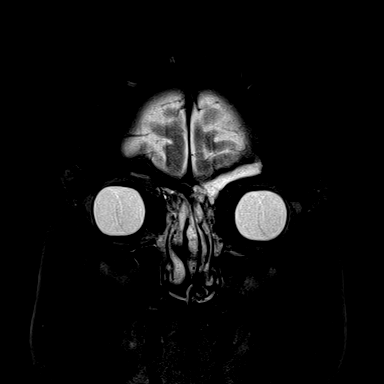
[im 28/32]
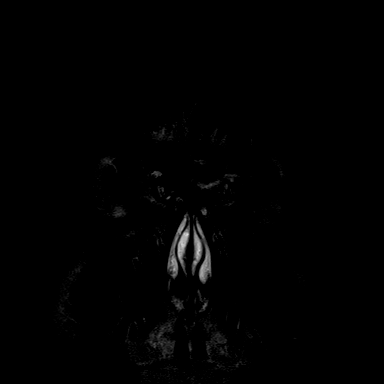
[im 32/32]
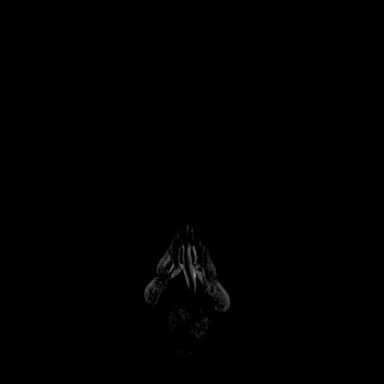

[Series 11: T2 fat-sat · coronal · 3.0mm · 0.47mm/px · 9 of 32 slices shown (3 of 3)]
[im 1/32]
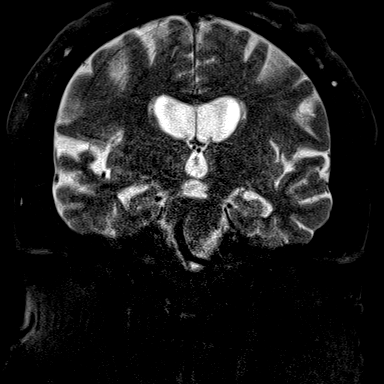
[im 4/32]
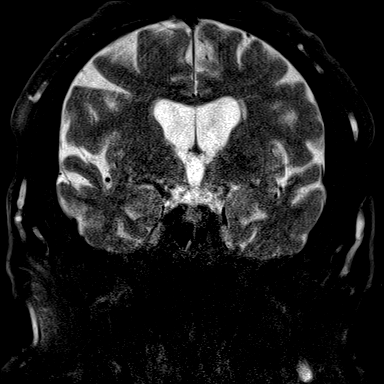
[im 8/32]
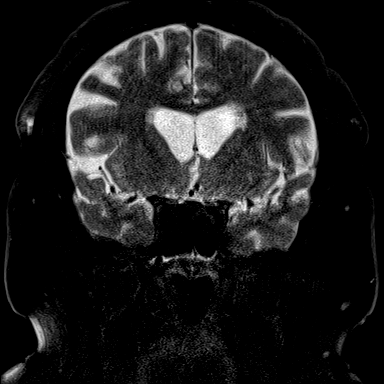
[im 12/32]
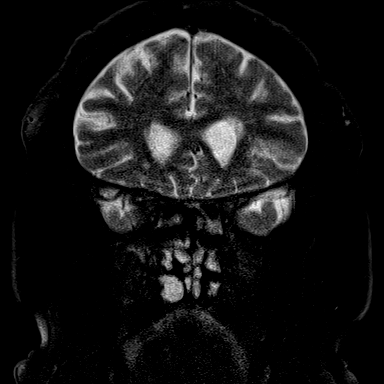
[im 16/32]
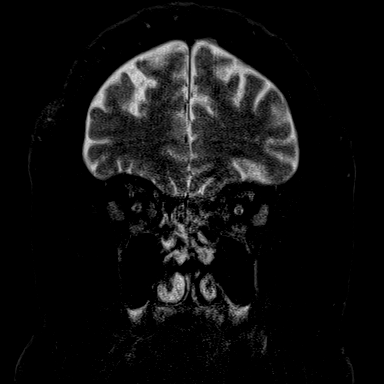
[im 20/32]
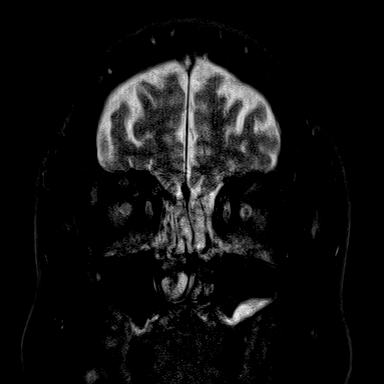
[im 24/32]
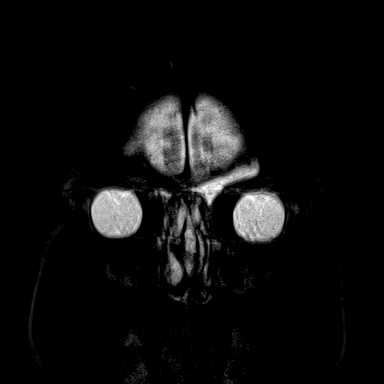
[im 28/32]
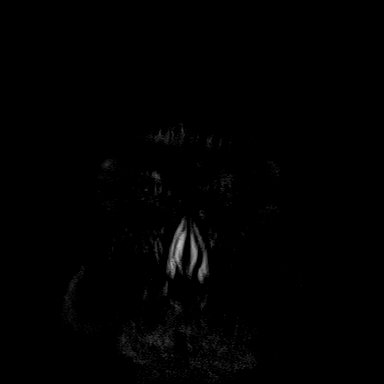
[im 32/32]
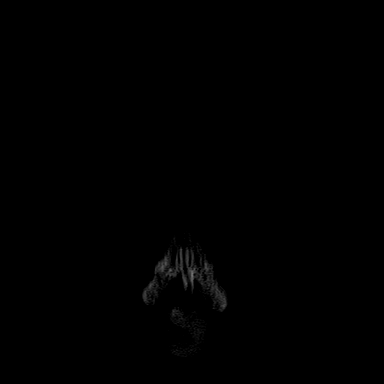

[36 of 48 positions shown; findings below may reference images not displayed]

FINDINGS: MRI HEAD FINDINGS

Brain: Examination degraded by motion artifact.

Generalized age-related cerebral atrophy. Mild scattered T2/FLAIR
hyperintensity noted involving the periventricular white matter,
most like related chronic microvascular ischemic disease, overall
fairly minimal for age.

No abnormal foci of restricted diffusion to suggest acute ischemia.
No areas of chronic cortical infarction. No evidence for acute or
chronic intracranial hemorrhage.

No mass lesion or midline shift. No hydrocephalus or extra-axial
fluid collection. Pituitary gland and suprasellar region normal.
Midline structures intact.

Vascular: Major intracranial vascular flow voids are maintained.

Skull and upper cervical spine: Craniocervical junction normal. Bone
marrow signal intensity normal. No scalp soft tissue abnormality.

Other: Trace left mastoid effusion noted, of doubtful significance.

MRI ORBITS FINDINGS

Orbits: Globes are symmetric in size with normal morphology. Patient
status post bilateral ocular lens replacement. Optic nerves
symmetric with normal noncontrast appearance. Extra-ocular muscles
symmetric and within normal limits. Intraconal and extraconal fat
well-maintained. Lacrimal glands normal. No abnormality about the
orbital apices or cavernous sinus. Superior orbital veins symmetric
and within normal limits. Optic chiasm normally situated within the
suprasellar cistern. Visualized optic radiations normal.

Visualized sinuses: Moderate mucosal thickening seen throughout the
ethmoidal air cells and maxillary sinuses.

Soft tissues: Unremarkable.
IMPRESSION: 1. Negative MRI of the brain and orbits. No acute intracranial
abnormality or findings to explain patient's symptoms.
2. Mild age-related cerebral atrophy with chronic small vessel
ischemic disease.
3. Moderate ethmoidal and maxillary sinusitis.

## 2021-05-27 IMAGING — CT CT ANGIO NECK
1 of 10 series · 6 of 33 positions shown · IV contrast (omnipaque)
Comparison: MRI from earlier the same day.

CLINICAL DATA: Initial evaluation for right-sided blurry vision.

EXAM:
CT ANGIOGRAPHY HEAD AND NECK
TECHNIQUE: Multidetector CT imaging of the head and neck was performed using
the standard protocol during bolus administration of intravenous
contrast. Multiplanar CT image reconstructions and MIPs were
obtained to evaluate the vascular anatomy. Carotid stenosis
measurements (when applicable) are obtained utilizing NASCET
criteria, using the distal internal carotid diameter as the
denominator.
CONTRAST:  75mL OMNIPAQUE IOHEXOL 350 MG/ML SOLN

[Series 12: ax thin · axial · 0.46mm/px · z∈[+1373,+1648]mm · 6 of 386 slices shown]
[im 56/386  soft-tissue]
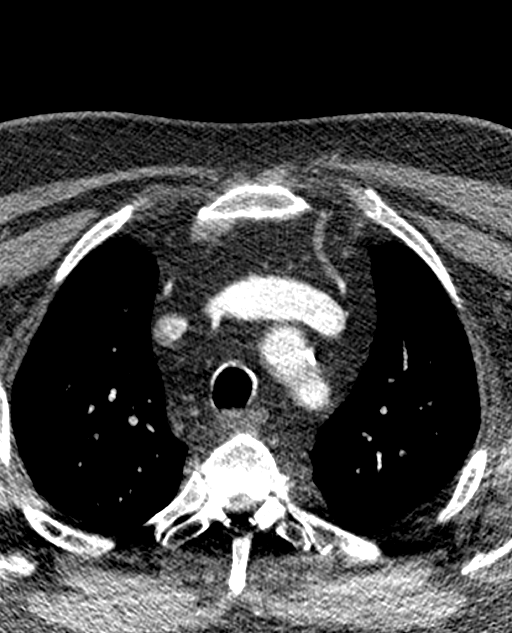
[im 111/386  bone]
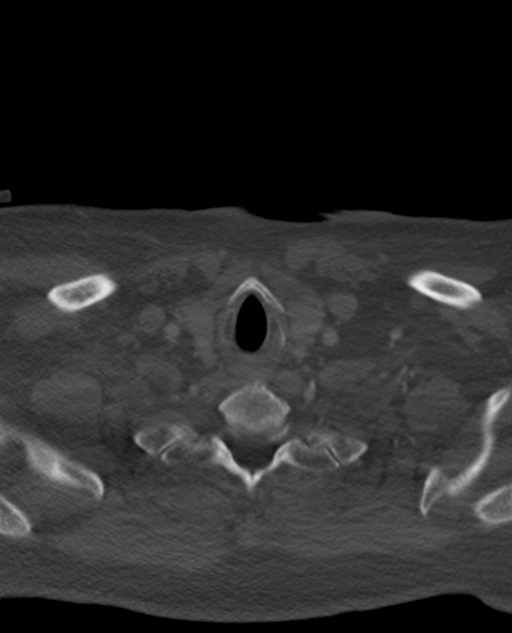
[im 166/386  soft-tissue]
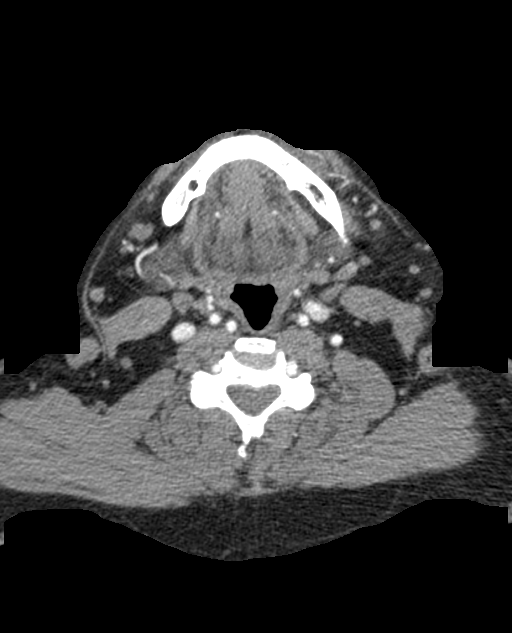
[im 221/386  bone]
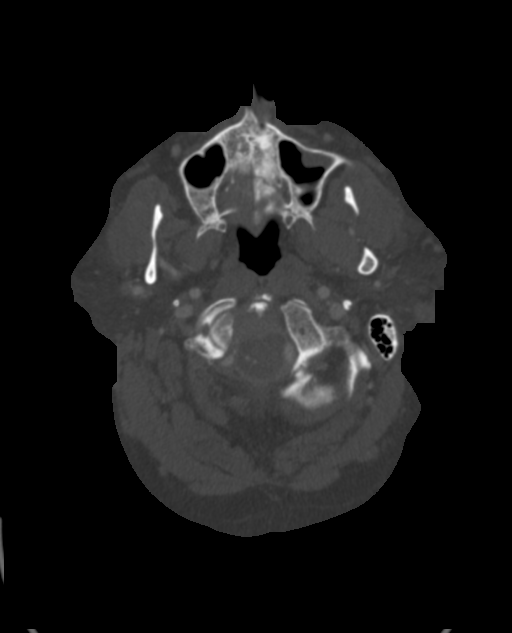
[im 276/386  soft-tissue]
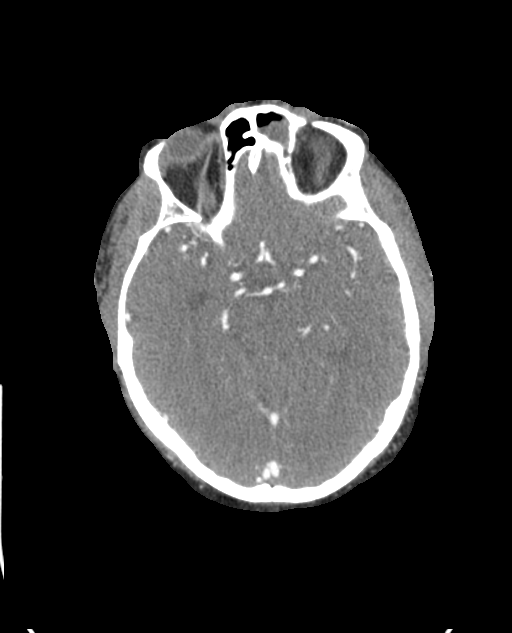
[im 331/386  bone]
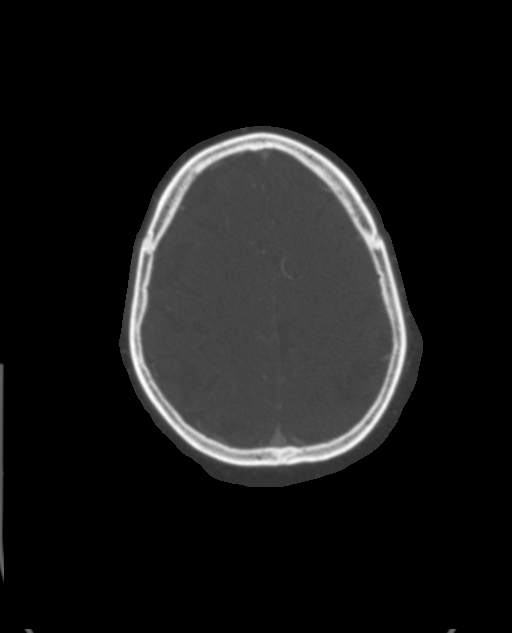

[6 of 33 positions shown; findings below may reference images not displayed]

FINDINGS: CT HEAD FINDINGS

Brain: Mild age-related cerebral atrophy with chronic small vessel
ischemic disease. No acute intracranial hemorrhage. No acute large
vessel territory infarct. No mass lesion, mass effect or midline
shift. No hydrocephalus or extra-axial fluid collection.

Vascular: No hyperdense vessel.

Skull: Scalp soft tissues and calvarium within normal limits.

Sinuses: Moderate mucosal thickening noted within the ethmoidal air
cells and partially visualized maxillary sinuses. No mastoid
effusion.

Orbits: Globes and orbital soft tissues demonstrate no acute
finding.

Review of the MIP images confirms the above findings

CTA NECK FINDINGS

Aortic arch: Visualized aortic arch normal caliber with normal
branch pattern. Mild atheromatous change about the arch and origin
of the great vessels without hemodynamically significant stenosis.

Right carotid system: Right CCA patent from its origin to the
bifurcation without stenosis. Eccentric calcified plaque at the
right carotid bulb/proximal right ICA with associated stenosis of up
to 40-50% by NASCET criteria. Right ICA patent distally without
stenosis, dissection or occlusion.

Left carotid system: Left CCA patent from its origin to the
bifurcation without stenosis. Eccentric calcified plaque at the left
carotid bulb/proximal left ICA with associated stenosis of up to
50-60% by NASCET criteria. Left ICA patent distally without
stenosis, dissection or occlusion.

Vertebral arteries: Both vertebral arteries arise from the
subclavian arteries. No proximal subclavian artery stenosis. Both
vertebral arteries widely patent without stenosis, dissection or
occlusion.

Skeleton: No discrete or worrisome osseous lesions. Moderate
multilevel spondylosis throughout the visualized spine.

Other neck: Unremarkable.

Upper chest: Unremarkable.

Review of the MIP images confirms the above findings

CTA HEAD FINDINGS

Anterior circulation: On the right, there is subtle intimal
irregularity with possible small focal filling defect involving the
horizontal petrous right ICA (series 14, images 94, 96). Given the
patient's history of blurry right vision, findings suspicious for a
possible small focal dissection. A possible small ruptured plaque
with intraluminal thrombus could also be considered. No frank raised
dissection flap or significant stenosis. Right ICA otherwise patent
distally to the terminus. Contralateral horizontal petrous left ICA
patent. Mild atheromatous change within the left carotid siphon
without significant stenosis. A1 segments patent bilaterally. Normal
anterior communicating artery complex. Anterior cerebral arteries
patent to their distal aspects without stenosis. No M1 stenosis or
occlusion. Normal MCA bifurcations. Distal MCA branches perfused and
symmetric.

Posterior circulation: Both V4 segments patent to the
vertebrobasilar junction without stenosis. Left vertebral artery
dominant. Both PICA origins patent and normal. Basilar patent to its
distal aspect without stenosis. Superior cerebellar arteries patent
bilaterally. Both PCAs primarily supplied via the basilar and are
well perfused to their distal aspects.

Venous sinuses: Patent allowing for timing the contrast.

Anatomic variants: None significant.

Review of the MIP images confirms the above findings
IMPRESSION: 1. Subtle intimal irregularity with small focal filling defects
involving the horizontal petrous right ICA. Given the patient's
history of right-sided blurry vision, finding is suspicious for a
possible small focal dissection. A possible small ruptured plaque
with intraluminal thrombus could also be considered. No frank raised
dissection flap or significant stenosis.
2. Atheromatous plaque about the carotid bifurcations/proximal ICAs
with associated stenoses of up to 40-50% on the right and 50-60% on
the left.
3. Otherwise wide patency of the major arterial vasculature of the
head and neck. No other hemodynamically significant or correctable
stenosis.
4. No other acute intracranial abnormality.

## 2021-05-27 IMAGING — MR MR HEAD W/O CM
10 series · 48 of 48 positions shown · non-contrast
Comparison: None.

CLINICAL DATA: Initial evaluation for right-sided blurry vision.

EXAM:
MRI HEAD AND ORBITS WITHOUT CONTRAST
TECHNIQUE: Multiplanar, multi-echo pulse sequences of the brain and surrounding
structures were acquired without intravenous contrast. Multiplanar,
multi-echo pulse sequences of the orbits and surrounding structures
were acquired including fat saturation techniques, without
intravenous contrast administration.

[Series 5: T1 · sagittal · 5.0mm · 0.75mm/px · 3 of 24 slices shown (1 of 2)]
[im 1/24]
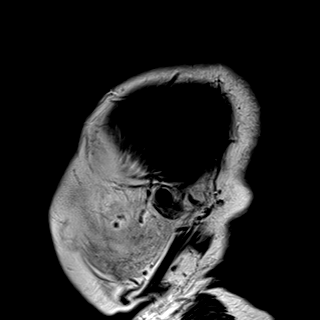
[im 12/24]
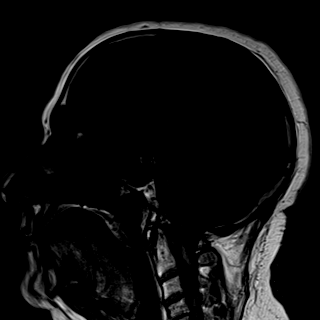
[im 24/24]
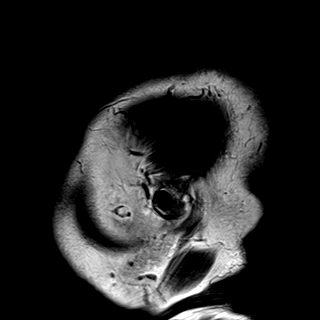

[Series 6: DWI · axial · 3.0mm · 1.36mm/px · z∈[-58,+86]mm · 9 of 100 slices shown (1 of 2)]
[im 1/100]
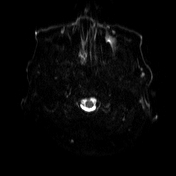
[im 13/100]
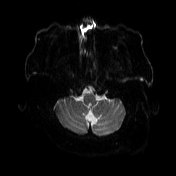
[im 25/100]
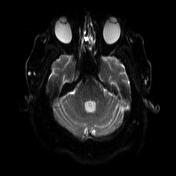
[im 38/100]
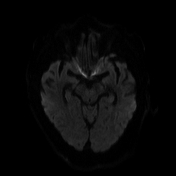
[im 50/100]
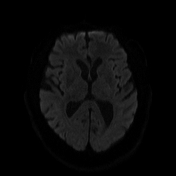
[im 62/100]
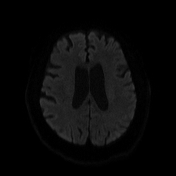
[im 75/100]
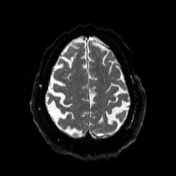
[im 87/100]
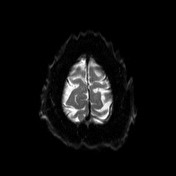
[im 100/100]
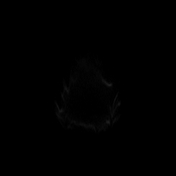

[Series 7: DWI · axial · 3.0mm · 1.36mm/px · z∈[-58,+86]mm · 4 of 50 slices shown (2 of 2)]
[im 1/50]
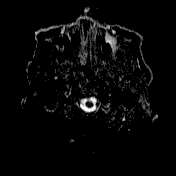
[im 17/50]
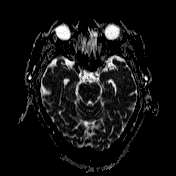
[im 33/50]
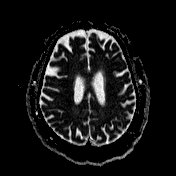
[im 50/50]
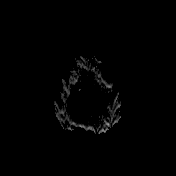

[Series 8: T2 · axial · 5.0mm · 0.62mm/px · z∈[-64,+95]mm · 2 of 26 slices shown (1 of 2)]
[im 1/26]
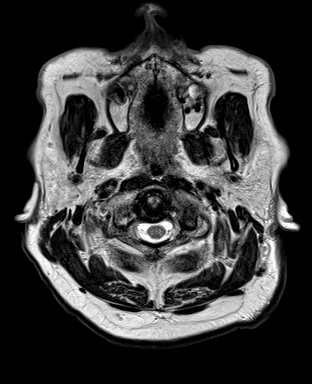
[im 26/26]
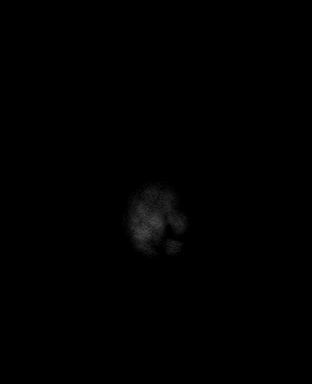

[Series 9: FLAIR · axial · 3.0mm · 0.75mm/px · z∈[-59,+91]mm · 4 of 52 slices shown]
[im 1/52]
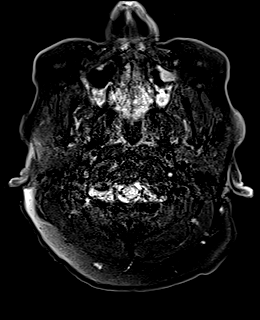
[im 18/52]
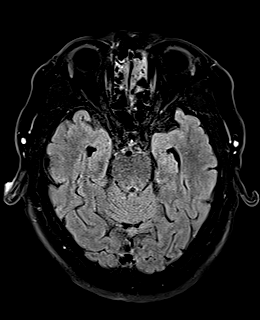
[im 35/52]
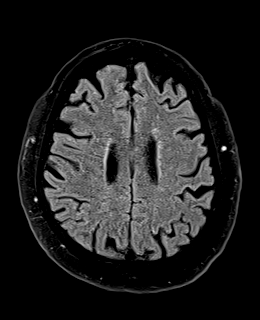
[im 52/52]
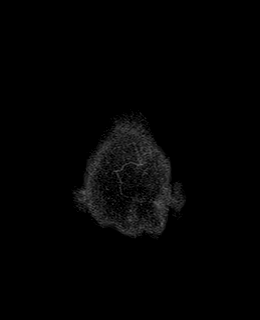

[Series 10: swi_images · axial · 3.0mm · 0.75mm/px · z∈[-59,+91]mm · 4 of 52 slices shown]
[im 1/52]
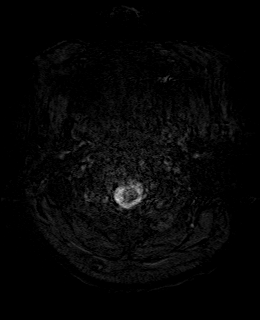
[im 18/52]
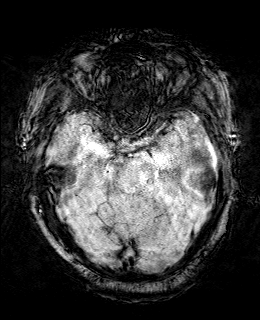
[im 35/52]
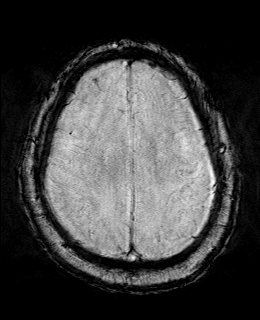
[im 52/52]
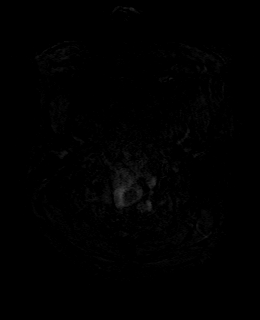

[Series 12: T1 · axial · 1.0mm · 0.94mm/px · z∈[-62,+94]mm · 13 of 160 slices shown (2 of 2)]
[im 1/160]
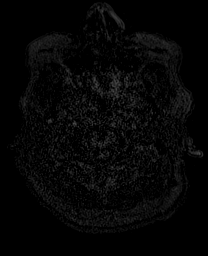
[im 14/160]
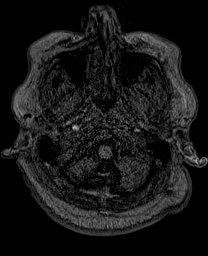
[im 27/160]
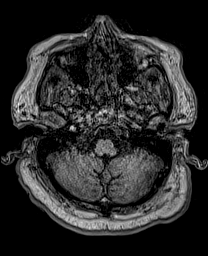
[im 40/160]
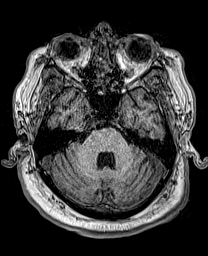
[im 54/160]
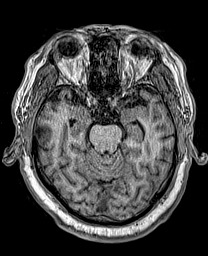
[im 67/160]
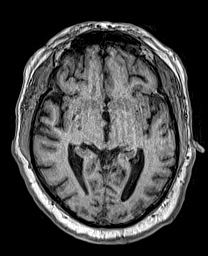
[im 80/160]
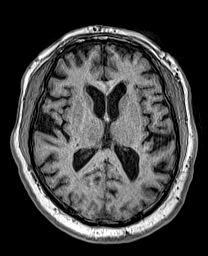
[im 93/160]
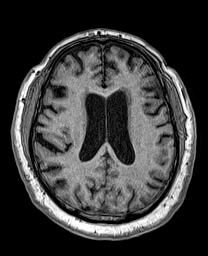
[im 107/160]
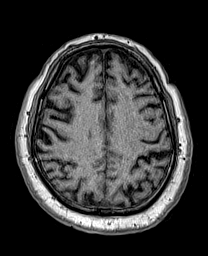
[im 120/160]
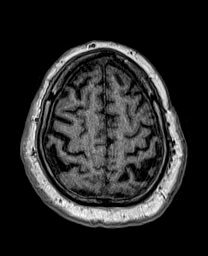
[im 133/160]
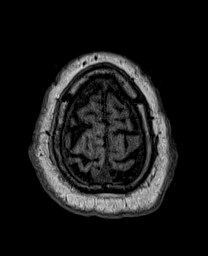
[im 146/160]
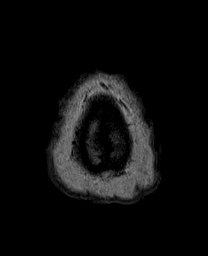
[im 160/160]
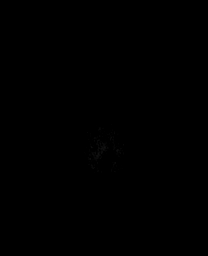

[Series 13: cor dwi_tracew · coronal · 5.0mm · 1.53mm/px · 4 of 54 slices shown]
[im 1/54]
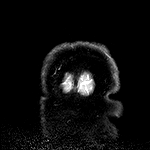
[im 18/54]
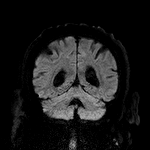
[im 36/54]
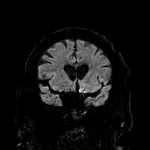
[im 54/54]
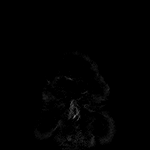

[Series 14: cor dwi_adc · coronal · 5.0mm · 1.53mm/px · 2 of 27 slices shown]
[im 1/27]
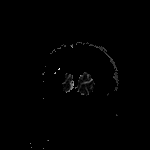
[im 27/27]
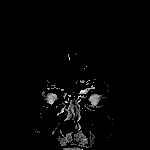

[Series 15: T2 · coronal · 5.0mm · 0.57mm/px · 3 of 36 slices shown (2 of 2)]
[im 1/36]
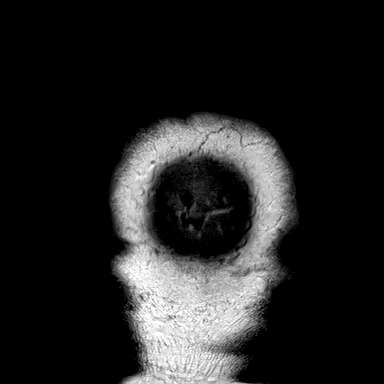
[im 18/36]
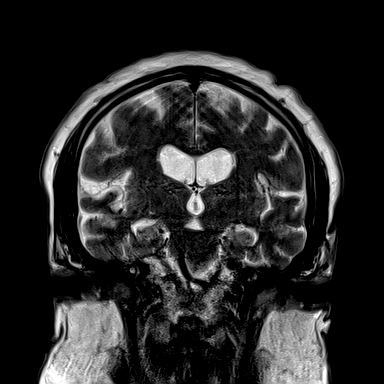
[im 36/36]
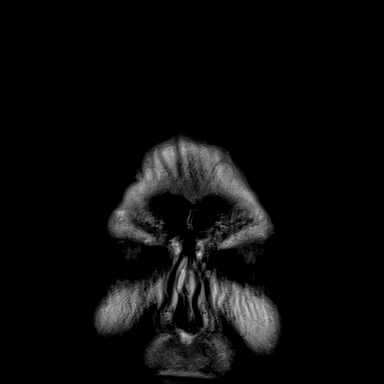

[48 of 48 positions shown; findings below may reference images not displayed]

FINDINGS: MRI HEAD FINDINGS

Brain: Examination degraded by motion artifact.

Generalized age-related cerebral atrophy. Mild scattered T2/FLAIR
hyperintensity noted involving the periventricular white matter,
most like related chronic microvascular ischemic disease, overall
fairly minimal for age.

No abnormal foci of restricted diffusion to suggest acute ischemia.
No areas of chronic cortical infarction. No evidence for acute or
chronic intracranial hemorrhage.

No mass lesion or midline shift. No hydrocephalus or extra-axial
fluid collection. Pituitary gland and suprasellar region normal.
Midline structures intact.

Vascular: Major intracranial vascular flow voids are maintained.

Skull and upper cervical spine: Craniocervical junction normal. Bone
marrow signal intensity normal. No scalp soft tissue abnormality.

Other: Trace left mastoid effusion noted, of doubtful significance.

MRI ORBITS FINDINGS

Orbits: Globes are symmetric in size with normal morphology. Patient
status post bilateral ocular lens replacement. Optic nerves
symmetric with normal noncontrast appearance. Extra-ocular muscles
symmetric and within normal limits. Intraconal and extraconal fat
well-maintained. Lacrimal glands normal. No abnormality about the
orbital apices or cavernous sinus. Superior orbital veins symmetric
and within normal limits. Optic chiasm normally situated within the
suprasellar cistern. Visualized optic radiations normal.

Visualized sinuses: Moderate mucosal thickening seen throughout the
ethmoidal air cells and maxillary sinuses.

Soft tissues: Unremarkable.
IMPRESSION: 1. Negative MRI of the brain and orbits. No acute intracranial
abnormality or findings to explain patient's symptoms.
2. Mild age-related cerebral atrophy with chronic small vessel
ischemic disease.
3. Moderate ethmoidal and maxillary sinusitis.

## 2021-05-27 MED ORDER — ACETAMINOPHEN 325 MG PO TABS: 650.0000 mg | ORAL_TABLET | ORAL | Status: DC | PRN

## 2021-05-27 MED ORDER — STROKE: EARLY STAGES OF RECOVERY BOOK
Freq: Once | Status: AC
  Filled 2021-05-27: qty 1

## 2021-05-27 MED ORDER — NAPROXEN 250 MG PO TABS: 500.0000 mg | ORAL_TABLET | Freq: Every day | ORAL | Status: DC | PRN

## 2021-05-27 MED ORDER — ASPIRIN 325 MG PO TABS
325.0000 mg | ORAL_TABLET | Freq: Once | ORAL | Status: AC
  Administered 2021-05-27: 325 mg via ORAL
  Filled 2021-05-27: qty 1

## 2021-05-27 MED ORDER — AZELASTINE HCL 0.1 % NA SOLN
1.0000 | Freq: Every day | NASAL | Status: DC
  Filled 2021-05-27: qty 30

## 2021-05-27 MED ORDER — LORATADINE 10 MG PO TABS
10.0000 mg | ORAL_TABLET | Freq: Every day | ORAL | Status: DC
  Administered 2021-05-28: 10 mg via ORAL
  Filled 2021-05-27: qty 1

## 2021-05-27 MED ORDER — ACETAMINOPHEN 650 MG RE SUPP: 650.0000 mg | RECTAL | Status: DC | PRN

## 2021-05-27 MED ORDER — SERTRALINE HCL 50 MG PO TABS
50.0000 mg | ORAL_TABLET | Freq: Every day | ORAL | Status: DC
  Administered 2021-05-28: 50 mg via ORAL
  Filled 2021-05-27: qty 1

## 2021-05-27 MED ORDER — ENOXAPARIN SODIUM 60 MG/0.6ML IJ SOSY
60.0000 mg | PREFILLED_SYRINGE | INTRAMUSCULAR | Status: DC
  Administered 2021-05-27: 60 mg via SUBCUTANEOUS
  Filled 2021-05-27: qty 0.6

## 2021-05-27 MED ORDER — ROSUVASTATIN CALCIUM 20 MG PO TABS
20.0000 mg | ORAL_TABLET | Freq: Every day | ORAL | Status: DC
  Administered 2021-05-28: 20 mg via ORAL
  Filled 2021-05-27: qty 1

## 2021-05-27 MED ORDER — MONTELUKAST SODIUM 10 MG PO TABS
10.0000 mg | ORAL_TABLET | Freq: Every day | ORAL | Status: DC
  Administered 2021-05-28: 10 mg via ORAL
  Filled 2021-05-27: qty 1

## 2021-05-27 MED ORDER — VITAMIN D 25 MCG (1000 UNIT) PO TABS
2000.0000 [IU] | ORAL_TABLET | Freq: Two times a day (BID) | ORAL | Status: DC
  Administered 2021-05-28: 2000 [IU] via ORAL
  Filled 2021-05-27: qty 2

## 2021-05-27 MED ORDER — ALBUTEROL SULFATE (2.5 MG/3ML) 0.083% IN NEBU: 2.5000 mg | INHALATION_SOLUTION | RESPIRATORY_TRACT | Status: DC | PRN

## 2021-05-27 MED ORDER — IOHEXOL 350 MG/ML SOLN
75.0000 mL | Freq: Once | INTRAVENOUS | Status: AC | PRN
  Administered 2021-05-27: 75 mL via INTRAVENOUS

## 2021-05-27 MED ORDER — FLUTICASONE PROPIONATE 50 MCG/ACT NA SUSP
2.0000 | Freq: Two times a day (BID) | NASAL | Status: DC
  Filled 2021-05-27: qty 16

## 2021-05-27 MED ORDER — LORAZEPAM 2 MG/ML IJ SOLN
1.0000 mg | Freq: Once | INTRAMUSCULAR | Status: AC | PRN
  Administered 2021-05-27: 1 mg via INTRAVENOUS
  Filled 2021-05-27: qty 1

## 2021-05-27 MED ORDER — ACETAMINOPHEN 160 MG/5ML PO SOLN: 650.0000 mg | ORAL | Status: DC | PRN

## 2021-05-27 MED ORDER — ASPIRIN 81 MG PO CHEW
81.0000 mg | CHEWABLE_TABLET | Freq: Every day | ORAL | Status: DC
  Administered 2021-05-28: 81 mg via ORAL
  Filled 2021-05-27: qty 1

## 2021-05-27 NOTE — ED Notes (Signed)
Janeece Fitting, PA at bedside

## 2021-05-27 NOTE — ED Notes (Addendum)
Back from MRI. Patient ambulated to the bathroom

## 2021-05-27 NOTE — ED Notes (Addendum)
Patient transported to MRI via wheelchair.

## 2021-05-27 NOTE — ED Notes (Signed)
Called [facility] for transport 

## 2021-05-27 NOTE — ED Provider Notes (Signed)
Emergency Medicine Provider Triage Evaluation Note  Erik Wiggins , a 75 y.o. male  was evaluated in triage.  Pt complains of visual field loss since this morning. States waking up had 9 AM, normally when he later experienced loss of vision to the inferior aspect of his visual field.  Evaluated by ophthalmology earlier, sent here for embolic work-up.  History of hypertension, hyperlipidemia.  On daily 81 mg aspirin.  Review of Systems  Positive: Vision loss, Negative: Leg weakness, arm weaknes  Physical Exam  BP (!) 164/87   Pulse 76   Temp (!) 97.4 F (36.3 C) (Oral)   Resp 17   Wt (!) 144.4 kg   SpO2 96%   BMI 45.69 kg/m  Gen:   Awake, no distress   Resp:  Normal effort  MSK:   Moves extremities without difficulty  Other:  Moves all upper and lower extremities, sensation is intact throughout.  Medical Decision Making  Medically screening exam initiated at 5:45 PM.  Appropriate orders placed.  Erik Wiggins was informed that the remainder of the evaluation will be completed by another provider, this initial triage assessment does not replace that evaluation, and the importance of remaining in the ED until their evaluation is complete.   5:49 PM spoke to telemetry neurologist Dr. Su Monks, who recommended CT head, patient is to be direct admitted to [facility] from the ED.  CTA neck and head will also need to be ordered if creatinine is okay.  He is to be continue on aspirin.  Give Plavix.  Will need permissive hypertension tension.  Can have as needed labetalol or hydralazine for blood pressure control.  We will also need A1c ordered.  Patient will have rest of the work-up carried out by physician Dr. Darl Householder  Portions of this note were generated with Dragon dictation software. Dictation errors may occur despite best attempts at proofreading.     Erik Fitting, PA-C 05/27/21 1750    Drenda Freeze, MD 05/27/21 [phone]

## 2021-05-27 NOTE — H&P (Signed)
History and Physical    Erik Wiggins EHM:[id number] DOB: 1945/09/08 DOA: 05/27/2021  PCP: Deland Pretty, MD  Patient coming from: Home  I have personally briefly reviewed patient's old medical records in [place]  Chief Complaint: right partial vision loss  HPI: Erik Wiggins is a 75 y.o. male with medical history significant for hypertension, asthma, OSA, hyperlipidemia and morbid obesity who presents with concerns of partial right eye vision loss.  Patient woke up about 6 AM this morning.  Then about 9 AM when he was driving he noticed floater and decreased vision to the medial side of his right eye.  He was evaluated by ophthalmology and was sent to ED for stroke work-up due to concerns of retinal artery occlusion. He denies any headache.  No lower extremity weakness or numbness or tingling.  No chest pain or palpitations. He is on daily aspirin and rosuvastatin  ED Course: He was afebrile, BP of 160/87.  No leukocytosis, hemoglobin 12.8. Sodium 139, K of 3.6, creatinine of 0.90, BG of 168.  Patient was not a candidate for IV tPA intervention since he was outside of window.  MRI brain and orbit was negative for any acute findings. CTA head and neck pending.  Neurology consulted by ED physician Dr. Darl Householder who recommends patient be treated as if he has a small stroke and to transfer to [facility].  Review of Systems: Constitutional: No Weight Change, No Fever ENT/Mouth: No sore throat, No Rhinorrhea Eyes: No Eye Pain, + Vision Changes Cardiovascular: No Chest Pain, no SOB,No Palpitations Respiratory: No Cough, No Sputum  Gastrointestinal: No Nausea, No Vomiting, No Diarrhea, No Constipation, No Pain Genitourinary: no Urinary Incontinence, No Urgency, No Flank Pain Musculoskeletal: No Arthralgias, No Myalgias Skin: No Skin Lesions, No Pruritus, Neuro: no Weakness, No Numbness,  No Loss of Consciousness, No Syncope Psych: No Anxiety/Panic, No Depression, no  decrease appetite Heme/Lymph: No Bruising, No Bleeding  Past Medical History:  Diagnosis Date   Allergic rhinitis    Androgen deficiency    Asthma    Asthma, mild intermittent, well-controlled 12/22/2014   Erectile dysfunction    High cholesterol    Hyperlipemia    Hypertension    Low testosterone    Major depressive disorder, recurrent episode, in partial remission (HCC)    Metatarsalgia    Moderate persistent asthma, uncomplicated 36/62/9476   Nasal polyps 06/21/2014   Demonstrated on previous sinus CT by report. 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 His smoking use included cigars. He has never used smokeless tobacco. He reports that he does not currently use alcohol. He reports that  he does not use drugs. Social History  Allergies  Allergen Reactions   Lisinopril Swelling and Other (See Comments)    Swollen lips   Sulfa Antibiotics Hives   Sulfacetamide Sodium Other (See Comments)    Family History  Problem Relation Age of Onset   Healthy Mother    Heart attack Father 23   AAA (abdominal aortic aneurysm) Father    Healthy Son    Healthy Son    Healthy Son    Cancer Maternal Grandfather    Multiple sclerosis Son         youngest   Allergic rhinitis Neg Hx    Angioedema Neg Hx    Asthma Neg Hx    Atopy Neg Hx    Eczema Neg Hx    Immunodeficiency Neg Hx    Urticaria Neg Hx      Prior to Admission medications   Medication Sig Start Date End Date Taking? Authorizing Provider  albuterol (PROAIR HFA) 108 (90 Base) MCG/ACT inhaler Inhale 4 puffs into the lungs every 4 (four) hours as needed for wheezing or shortness of breath. 2 puffs every 6 hrs as needed Patient taking differently: Inhale 4 puffs into the lungs every 4 (four) hours as needed for wheezing or shortness of breath. 08/12/16  Yes Valentina Shaggy, MD  aspirin 81 MG chewable tablet Chew 81 mg by mouth daily. 09/30/20  Yes [provider]  azelastine (ASTELIN) 0.1 % nasal spray Place 1 spray into both nostrils 2 (two) times daily. Use in each nostril as directed Patient taking differently: Place 1 spray into both nostrils daily. 11/03/16  Yes Valentina Shaggy, MD  cetirizine (ZYRTEC) 10 MG tablet Take 10 mg by mouth daily.   Yes [provider]  Cholecalciferol (VITAMIN D) 2000 units CAPS Take 2,000 Units by mouth 2 (two) times daily.   Yes [provider]  indapamide (LOZOL) 2.5 MG tablet Take 2.5 mg by mouth daily.  05/31/14  Yes [provider]  ipratropium (ATROVENT) 0.03 % nasal spray Place 1 spray into both nostrils daily as needed for rhinitis. 05/16/14  Yes [provider]  montelukast (SINGULAIR) 10 MG tablet Take 10 mg by mouth at bedtime.  05/31/14  Yes [provider]  Multiple Vitamin (MULTIVITAMIN WITH MINERALS) TABS tablet Take 1 tablet by mouth daily. Men's  One A Day over 50   Yes [provider]  naproxen sodium (ALEVE) 220 MG tablet Take 440 mg by mouth daily as needed (knee pain).   Yes [provider]  Polyethyl Glycol-Propyl Glycol 0.4-0.3 % SOLN Place 2 drops into both eyes daily as needed (for dry eyes).   Yes [provider]   rosuvastatin (CRESTOR) 20 MG tablet Take 1 tablet (20 mg total) by mouth daily. 02/05/21  Yes Nahser, Wonda Cheng, MD  sertraline (ZOLOFT) 50 MG tablet Take 50 mg by mouth daily.  10/29/16  Yes [provider]  tobramycin (TOBREX) 0.3 % ophthalmic solution Place 1 drop into the left eye 4 (four) times daily. 03/26/21  Yes [provider]  Triamcinolone Acetonide (NASACORT ALLERGY 24HR NA) Place 1 spray into the nose daily as needed (congestion).   Yes [provider]  TURMERIC CURCUMIN PO Take 1,500 mg by mouth daily.   Yes [provider]  erythromycin ophthalmic ointment Place a 1/2 inch ribbon of ointment into the lower eyelid four times a day. Patient not taking: No sig reported 11/26/30   Delora Fuel, MD  Fluticasone  Propionate (XHANCE) 93 MCG/ACT EXHU Place 2 sprays into both nostrils 2 (two) times daily. 03/26/20   Garnet Sierras, DO    Physical Exam: Vitals:   05/27/21 1741 05/27/21 1805 05/27/21 2026 05/27/21 2120  BP: (!) 164/87  136/78 (!) 153/80  Pulse: 76  62 66  Resp: 17  18   Temp: (!) 97.4 F (36.3 C)     TempSrc: Oral     SpO2: 96%  97% 95%  Weight: (!) 144.4 kg (!) 144.2 kg    Height:  5\' 10"  (1.778 m)      Constitutional: NAD, calm, comfortable morbidly obese male sitting upright at the edge of his bed Vitals:   05/27/21 1741 05/27/21 1805 05/27/21 2026 05/27/21 2120  BP: (!) 164/87  136/78 (!) 153/80  Pulse: 76  62 66  Resp: 17  18   Temp: (!) 97.4 F (36.3 C)     TempSrc: Oral     SpO2: 96%  97% 95%  Weight: (!) 144.4 kg (!) 144.2 kg    Height:  5\' 10"  (1.778 m)     Eyes: PERRL, lids and conjunctivae normal ENMT: Mucous membranes are moist.  Neck: normal, supple Respiratory: clear to auscultation bilaterally, no wheezing, no crackles. Normal respiratory effort. No accessory muscle use.  Cardiovascular: Regular rate and rhythm, no murmurs / rubs / gallops. No extremity edema.  Abdomen: no tenderness, no masses palpated.  Bowel  sounds positive.  Musculoskeletal: no clubbing / cyanosis. No joint deformity upper and lower extremities. Good ROM, no contractures. Normal muscle tone.  Skin: no rashes, lesions, ulcers. No induration Neurologic: CN 2-12 grossly intact. Sensation intact, D Strength 5/5 in all 4.  Intact finger-nose.  Intact heel-to-shin. Psychiatric: Normal judgment and insight. Alert and oriented x 3. Normal mood.    Labs on Admission: I have personally reviewed following labs and imaging studies  CBC: Recent Labs  Lab 05/27/21 1743 05/27/21 1812  WBC 7.2  --   NEUTROABS 5.0  --   HGB 12.8* 12.9*  HCT 39.3 38.0*  MCV 89.5  --   PLT 122*  --    Basic Metabolic Panel: Recent Labs  Lab 05/27/21 1743 05/27/21 1812  NA 139 142  K 3.6 3.6  CL 106 104  CO2 25  --   GLUCOSE 168* 167*  BUN 13 12  CREATININE 0.90 0.90  CALCIUM 9.3  --    GFR: Estimated Creatinine Clearance: 101.8 mL/min (by C-G formula based on SCr of 0.9 mg/dL). Liver Function Tests: Recent Labs  Lab 05/27/21 1743  AST 21  ALT 20  ALKPHOS 65  BILITOT 0.8  PROT 7.4  ALBUMIN 4.1   No results for input(s): LIPASE, AMYLASE in the last 168 hours. No results for input(s): AMMONIA in the last 168 hours. Coagulation Profile: Recent Labs  Lab 05/27/21 1743  INR 1.0   Cardiac Enzymes: No results for input(s): CKTOTAL, CKMB, CKMBINDEX, TROPONINI in the last 168 hours. BNP (last 3 results) No results for input(s): PROBNP in the last 8760 hours. HbA1C: Recent Labs    05/27/21 1751  HGBA1C 6.0*   CBG: No results for input(s): GLUCAP in the last 168 hours. Lipid Profile: No results for input(s): CHOL, HDL, LDLCALC, TRIG, CHOLHDL, LDLDIRECT in the last 72 hours. Thyroid Function Tests: No results for input(s): TSH, T4TOTAL, FREET4, T3FREE, THYROIDAB in the last 72 hours. Anemia Panel: No results for input(s): VITAMINB12, FOLATE, FERRITIN, TIBC, IRON, RETICCTPCT in the last 72 hours. Urine analysis:  No results  found for: COLORURINE, APPEARANCEUR, LABSPEC, [place], GLUCOSEU, HGBUR, BILIRUBINUR, KETONESUR, PROTEINUR, UROBILINOGEN, NITRITE, LEUKOCYTESUR  Radiological Exams on Admission: MR BRAIN WO CONTRAST  Result Date: 05/27/2021 CLINICAL DATA:  Initial evaluation for right-sided blurry vision. EXAM: MRI HEAD AND ORBITS WITHOUT CONTRAST TECHNIQUE: Multiplanar, multi-echo pulse sequences of the brain and surrounding structures were acquired without intravenous contrast. Multiplanar, multi-echo pulse sequences of the orbits and surrounding structures were acquired including fat saturation techniques, without intravenous contrast administration. COMPARISON:  None. FINDINGS: MRI HEAD FINDINGS Brain: Examination degraded by motion artifact. Generalized age-related cerebral atrophy. Mild scattered T2/FLAIR hyperintensity noted involving the periventricular white matter, most like related chronic microvascular ischemic disease, overall fairly minimal for age. No abnormal foci of restricted diffusion to suggest acute ischemia. No areas of chronic cortical infarction. No evidence for acute or chronic intracranial hemorrhage. No mass lesion or midline shift. No hydrocephalus or extra-axial fluid collection. Pituitary gland and suprasellar region normal. Midline structures intact. Vascular: Major intracranial vascular flow voids are maintained. Skull and upper cervical spine: Craniocervical junction normal. Bone marrow signal intensity normal. No scalp soft tissue abnormality. Other: Trace left mastoid effusion noted, of doubtful significance. MRI ORBITS FINDINGS Orbits: Globes are symmetric in size with normal morphology. Patient status post bilateral ocular lens replacement. Optic nerves symmetric with normal noncontrast appearance. Extra-ocular muscles symmetric and within normal limits. Intraconal and extraconal fat well-maintained. Lacrimal glands normal. No abnormality about the orbital apices or cavernous sinus. Superior  orbital veins symmetric and within normal limits. Optic chiasm normally situated within the suprasellar cistern. Visualized optic radiations normal. Visualized sinuses: Moderate mucosal thickening seen throughout the ethmoidal air cells and maxillary sinuses. Soft tissues: Unremarkable. IMPRESSION: 1. Negative MRI of the brain and orbits. No acute intracranial abnormality or findings to explain patient's symptoms. 2. Mild age-related cerebral atrophy with chronic small vessel ischemic disease. 3. Moderate ethmoidal and maxillary sinusitis. Electronically Signed   By: Jeannine Boga M.D.   On: 05/27/2021 21:01   MR ORBITS WO CONTRAST  Result Date: 05/27/2021 CLINICAL DATA:  Initial evaluation for right-sided blurry vision. EXAM: MRI HEAD AND ORBITS WITHOUT CONTRAST TECHNIQUE: Multiplanar, multi-echo pulse sequences of the brain and surrounding structures were acquired without intravenous contrast. Multiplanar, multi-echo pulse sequences of the orbits and surrounding structures were acquired including fat saturation techniques, without intravenous contrast administration. COMPARISON:  None. FINDINGS: MRI HEAD FINDINGS Brain: Examination degraded by motion artifact. Generalized age-related cerebral atrophy. Mild scattered T2/FLAIR hyperintensity noted involving the periventricular white matter, most like related chronic microvascular ischemic disease, overall fairly minimal for age. No abnormal foci of restricted diffusion to suggest acute ischemia. No areas of chronic cortical infarction. No evidence for acute or chronic intracranial hemorrhage. No mass lesion or midline shift. No hydrocephalus or extra-axial fluid collection. Pituitary gland and suprasellar region normal. Midline structures intact. Vascular: Major intracranial vascular flow voids are maintained. Skull and upper cervical spine: Craniocervical junction normal. Bone marrow signal intensity normal. No scalp soft tissue abnormality. Other: Trace  left mastoid effusion noted, of doubtful significance. MRI ORBITS FINDINGS Orbits: Globes are symmetric in size with normal morphology. Patient status post bilateral ocular lens replacement. Optic nerves symmetric with normal noncontrast appearance. Extra-ocular muscles symmetric and within normal limits. Intraconal and extraconal fat well-maintained. Lacrimal glands normal. No abnormality about the orbital apices or cavernous sinus. Superior orbital veins symmetric and within normal limits. Optic chiasm normally situated within the suprasellar cistern. Visualized optic radiations normal. Visualized sinuses: Moderate mucosal thickening seen throughout the ethmoidal  air cells and maxillary sinuses. Soft tissues: Unremarkable. IMPRESSION: 1. Negative MRI of the brain and orbits. No acute intracranial abnormality or findings to explain patient's symptoms. 2. Mild age-related cerebral atrophy with chronic small vessel ischemic disease. 3. Moderate ethmoidal and maxillary sinusitis. Electronically Signed   By: Jeannine Boga M.D.   On: 05/27/2021 21:01      Assessment/Plan  Acute partial right eye vision loss -symptoms occurred at 9AM and patient was sent over by ophthalmology for concerns of retinal artery occlusion.  No IV tPA given since outside of window. -admit and transfer to [facility] for neurology consultation -MRI brain and orbit negative -CTA head and neck pending  -obtain echo -Obtain hemoglobin A1c, lipid -Continue daily aspirin, statin -PT/OT/SLT -Frequent neuro checks and keep on telemetry -Allow for permissive hypertension with blood pressure treatment as needed only if systolic goes above 585  Hypertension Allow for permissive hypertension as above Hold home antihypertensive  Asthma - Not in acute exacerbation  OSA - Continue CPAP  Morbid obesity - BMI of 45 - Counseled for weight loss  DVT prophylaxis:.Lovenox Code Status: Full Family Communication: Plan discussed with  patient  and wife at bedside  disposition Plan: Home with observation Consults called: Neurology Admission status: Observation  Level of care: Telemetry Cardiac  Status is: Observation  The patient remains OBS appropriate and will d/c before 2 midnights.        Orene Desanctis DO [facility]   If 7PM-7AM, please contact night-coverage [email or web address]   05/27/2021, 10:02 PM

## 2021-05-27 NOTE — ED Notes (Signed)
Back from CT

## 2021-05-27 NOTE — ED Notes (Signed)
Report given to [place], [place]

## 2021-05-27 NOTE — ED Triage Notes (Signed)
PT states around 0900 this morning he began losing vision in his right eye. Pt denies any other symptoms. States he was fine when he woke up. Pt was seen by optho  and  was told to come to the ED to rule out a stroke.

## 2021-05-27 NOTE — ED Provider Notes (Signed)
[facility] DEPT Provider Note   CSN: [id number] Arrival date & time: 05/27/21  1732     History Chief Complaint  Patient presents with   Loss of Vision    Erik Wiggins is a 75 y.o. male history of hypertension, high cholesterol, here presenting with loss of vision.  She states that he woke up around 7 AM and was feeling fine.  He states that he started driving around 9 AM and noticed that there is floaters in his vision and there is loss of vision.  Patient went to ophthalmology and was noted to have visual field cut in the right.  There was concern for retinal artery occlusion. Patient was sent here for stroke work-up.  Patient denies any trouble speaking or arm or leg weakness.  The history is provided by the patient.      Past Medical History:  Diagnosis Date   Allergic rhinitis    Androgen deficiency    Asthma    Asthma, mild intermittent, well-controlled 12/22/2014   Erectile dysfunction    High cholesterol    Hyperlipemia    Hypertension    Low testosterone    Major depressive disorder, recurrent episode, in partial remission ([place])    Metatarsalgia    Moderate persistent asthma, uncomplicated 03/02/4817   Nasal polyps 06/21/2014   Demonstrated on previous sinus CT by report. No history of aspirin intolerance    Obesity 06/21/2014   Onychomycosis    OSA (obstructive sleep apnea)    OSA, uses CPAP nightly   Perennial allergic rhinitis 06/21/2014   Allergy vaccine started 2012 in [place]    Umbilical hernia    Vitamin D deficiency     Patient Active Problem List   Diagnosis Date Noted   Unilateral primary osteoarthritis, left knee 11/25/2020   History of nasal polyp 56/31/4970   Umbilical hernia 26/37/8588   Moderate persistent asthma without complication 50/27/7412   Asthma, mild intermittent, well-controlled 12/22/2014   Other allergic rhinitis 06/21/2014   Nasal polyps 06/21/2014   Obesity 06/21/2014   OSA (obstructive  sleep apnea) 06/05/2014    Past Surgical History:  Procedure Laterality Date   CARDIAC CATHETERIZATION     COLONSCOPY     EYE SURGERY     cataracts   INSERTION OF MESH N/A 02/11/2018   Procedure: INSERTION OF MESH;  Surgeon: Armandina Gemma, MD;  Location: [facility];  Service: General;  Laterality: N/A;   UMBILICAL HERNIA REPAIR N/A 02/11/2018   Procedure: OPEN REPAIR UMBILICAL HERNIA;  Surgeon: Armandina Gemma, MD;  Location: [facility];  Service: General;  Laterality: N/A;       Family History  Problem Relation Age of Onset   Healthy Mother    Heart attack Father 31   AAA (abdominal aortic aneurysm) Father    Healthy Son    Healthy Son    Healthy Son    Cancer Maternal Grandfather    Multiple sclerosis Son        youngest   Allergic rhinitis Neg Hx    Angioedema Neg Hx    Asthma Neg Hx    Atopy Neg Hx    Eczema Neg Hx    Immunodeficiency Neg Hx    Urticaria Neg Hx     Social History   Tobacco Use   Smoking status: Former    Types: Cigars    Quit date: 07/21/1995    Years since quitting: 25.8   Smokeless tobacco: Never  Vaping  Use   Vaping Use: Never used  Substance Use Topics   Alcohol use: Not Currently    Comment: social   Drug use: No    Home Medications Prior to Admission medications   Medication Sig Start Date End Date Taking? Authorizing Provider  albuterol (PROAIR HFA) 108 (90 Base) MCG/ACT inhaler Inhale 4 puffs into the lungs every 4 (four) hours as needed for wheezing or shortness of breath. 2 puffs every 6 hrs as needed Patient taking differently: Inhale 4 puffs into the lungs every 4 (four) hours as needed for wheezing or shortness of breath. 08/12/16  Yes Valentina Shaggy, MD  aspirin 81 MG chewable tablet Chew 81 mg by mouth daily. 09/30/20  Yes [provider]  azelastine (ASTELIN) 0.1 % nasal spray Place 1 spray into both nostrils 2 (two) times daily. Use in each nostril as directed Patient taking  differently: Place 1 spray into both nostrils daily. 11/03/16  Yes Valentina Shaggy, MD  ipratropium (ATROVENT) 0.03 % nasal spray Place 1 spray into both nostrils daily as needed for rhinitis. 05/16/14  Yes [provider]  cetirizine (ZYRTEC) 10 MG tablet Take 10 mg by mouth daily.    [provider]  Cholecalciferol (VITAMIN D) 2000 units CAPS Take 4,000 Units by mouth daily.     [provider]  erythromycin ophthalmic ointment Place a 1/2 inch ribbon of ointment into the lower eyelid four times a day. Patient not taking: Reported on 38/09/3381 2/91/91   Delora Fuel, MD  Fluticasone Propionate Truett Perna) 93 MCG/ACT EXHU Place 2 sprays into both nostrils 2 (two) times daily. 03/26/20   Garnet Sierras, DO  indapamide (LOZOL) 2.5 MG tablet Take 2.5 mg by mouth daily.  05/31/14   [provider]  montelukast (SINGULAIR) 10 MG tablet Take 10 mg by mouth at bedtime.  05/31/14   [provider]  Polyethyl Glycol-Propyl Glycol 0.4-0.3 % SOLN Place 2 drops into both eyes daily as needed (for dry eyes).    [provider]  rosuvastatin (CRESTOR) 20 MG tablet Take 1 tablet (20 mg total) by mouth daily. 02/05/21   Nahser, Wonda Cheng, MD  sertraline (ZOLOFT) 50 MG tablet Take 50 mg by mouth daily.  10/29/16   [provider]    Allergies    Lisinopril, Sulfa antibiotics, and Sulfacetamide sodium  Review of Systems   Review of Systems  Eyes:  Positive for visual disturbance.  All other systems reviewed and are negative.  Physical Exam Updated Vital Signs BP (!) 153/80   Pulse 66   Temp (!) 97.4 F (36.3 C) (Oral)   Resp 18   Ht 5\' 10"  (1.778 m)   Wt (!) 144.2 kg   SpO2 95%   BMI 45.63 kg/m   Physical Exam Vitals and nursing note reviewed.  Constitutional:      Appearance: Normal appearance.  HENT:     Head: Normocephalic.     Nose: Nose normal.     Mouth/Throat:     Mouth: Mucous membranes are moist.  Eyes:     Extraocular  Movements: Extraocular movements intact.     Pupils: Pupils are equal, round, and reactive to light.     Comments: Left inferior visual field cut bilaterally  Cardiovascular:     Rate and Rhythm: Normal rate and regular rhythm.     Pulses: Normal pulses.     Heart sounds: Normal heart sounds.  Pulmonary:     Effort: Pulmonary effort is normal.  Breath sounds: Normal breath sounds.  Abdominal:     General: Abdomen is flat.     Palpations: Abdomen is soft.  Musculoskeletal:        General: Normal range of motion.     Cervical back: Normal range of motion and neck supple.  Skin:    General: Skin is warm.     Capillary Refill: Capillary refill takes less than 2 seconds.  Neurological:     General: No focal deficit present.     Mental Status: He is alert and oriented to person, place, and time.     Comments: Has no obvious facial droop.  Patient has normal strength bilateral arms and legs.  Normal finger-to-nose bilaterally  Psychiatric:        Mood and Affect: Mood normal.        Behavior: Behavior normal.    ED Results / Procedures / Treatments   Labs (all labs ordered are listed, but only abnormal results are displayed) Labs Reviewed  CBC - Abnormal; Notable for the following components:      Result Value   Hemoglobin 12.8 (*)    RDW 15.9 (*)    Platelets 122 (*)    All other components within normal limits  COMPREHENSIVE METABOLIC PANEL - Abnormal; Notable for the following components:   Glucose, Bld 168 (*)    All other components within normal limits  I-STAT CHEM 8, ED - Abnormal; Notable for the following components:   Glucose, Bld 167 (*)    Hemoglobin 12.9 (*)    HCT 38.0 (*)    All other components within normal limits  RESP PANEL BY RT-PCR (FLU A&B, COVID) ARPGX2  ETHANOL  PROTIME-INR  APTT  DIFFERENTIAL  RAPID URINE DRUG SCREEN, HOSP PERFORMED  URINALYSIS, ROUTINE W REFLEX MICROSCOPIC  HEMOGLOBIN A1C    EKG EKG Interpretation  Date/Time:  Tuesday  May 27 2021 17:53:20 EST Ventricular Rate:  78 PR Interval:  146 QRS Duration: 98 QT Interval:  471 QTC Calculation: 537 R Axis:   44 Text Interpretation: Sinus rhythm Low voltage, precordial leads Borderline repolarization abnormality Prolonged QT interval No significant change since last tracing Confirmed by Wandra Arthurs [phone]) on 05/27/2021 6:16:27 PM  Radiology MR BRAIN WO CONTRAST  Result Date: 05/27/2021 CLINICAL DATA:  Initial evaluation for right-sided blurry vision. EXAM: MRI HEAD AND ORBITS WITHOUT CONTRAST TECHNIQUE: Multiplanar, multi-echo pulse sequences of the brain and surrounding structures were acquired without intravenous contrast. Multiplanar, multi-echo pulse sequences of the orbits and surrounding structures were acquired including fat saturation techniques, without intravenous contrast administration. COMPARISON:  None. FINDINGS: MRI HEAD FINDINGS Brain: Examination degraded by motion artifact. Generalized age-related cerebral atrophy. Mild scattered T2/FLAIR hyperintensity noted involving the periventricular white matter, most like related chronic microvascular ischemic disease, overall fairly minimal for age. No abnormal foci of restricted diffusion to suggest acute ischemia. No areas of chronic cortical infarction. No evidence for acute or chronic intracranial hemorrhage. No mass lesion or midline shift. No hydrocephalus or extra-axial fluid collection. Pituitary gland and suprasellar region normal. Midline structures intact. Vascular: Major intracranial vascular flow voids are maintained. Skull and upper cervical spine: Craniocervical junction normal. Bone marrow signal intensity normal. No scalp soft tissue abnormality. Other: Trace left mastoid effusion noted, of doubtful significance. MRI ORBITS FINDINGS Orbits: Globes are symmetric in size with normal morphology. Patient status post bilateral ocular lens replacement. Optic nerves symmetric with normal noncontrast  appearance. Extra-ocular muscles symmetric and within normal limits. Intraconal and extraconal fat well-maintained.  Lacrimal glands normal. No abnormality about the orbital apices or cavernous sinus. Superior orbital veins symmetric and within normal limits. Optic chiasm normally situated within the suprasellar cistern. Visualized optic radiations normal. Visualized sinuses: Moderate mucosal thickening seen throughout the ethmoidal air cells and maxillary sinuses. Soft tissues: Unremarkable. IMPRESSION: 1. Negative MRI of the brain and orbits. No acute intracranial abnormality or findings to explain patient's symptoms. 2. Mild age-related cerebral atrophy with chronic small vessel ischemic disease. 3. Moderate ethmoidal and maxillary sinusitis. Electronically Signed   By: Jeannine Boga M.D.   On: 05/27/2021 21:01   MR ORBITS WO CONTRAST  Result Date: 05/27/2021 CLINICAL DATA:  Initial evaluation for right-sided blurry vision. EXAM: MRI HEAD AND ORBITS WITHOUT CONTRAST TECHNIQUE: Multiplanar, multi-echo pulse sequences of the brain and surrounding structures were acquired without intravenous contrast. Multiplanar, multi-echo pulse sequences of the orbits and surrounding structures were acquired including fat saturation techniques, without intravenous contrast administration. COMPARISON:  None. FINDINGS: MRI HEAD FINDINGS Brain: Examination degraded by motion artifact. Generalized age-related cerebral atrophy. Mild scattered T2/FLAIR hyperintensity noted involving the periventricular white matter, most like related chronic microvascular ischemic disease, overall fairly minimal for age. No abnormal foci of restricted diffusion to suggest acute ischemia. No areas of chronic cortical infarction. No evidence for acute or chronic intracranial hemorrhage. No mass lesion or midline shift. No hydrocephalus or extra-axial fluid collection. Pituitary gland and suprasellar region normal. Midline structures intact.  Vascular: Major intracranial vascular flow voids are maintained. Skull and upper cervical spine: Craniocervical junction normal. Bone marrow signal intensity normal. No scalp soft tissue abnormality. Other: Trace left mastoid effusion noted, of doubtful significance. MRI ORBITS FINDINGS Orbits: Globes are symmetric in size with normal morphology. Patient status post bilateral ocular lens replacement. Optic nerves symmetric with normal noncontrast appearance. Extra-ocular muscles symmetric and within normal limits. Intraconal and extraconal fat well-maintained. Lacrimal glands normal. No abnormality about the orbital apices or cavernous sinus. Superior orbital veins symmetric and within normal limits. Optic chiasm normally situated within the suprasellar cistern. Visualized optic radiations normal. Visualized sinuses: Moderate mucosal thickening seen throughout the ethmoidal air cells and maxillary sinuses. Soft tissues: Unremarkable. IMPRESSION: 1. Negative MRI of the brain and orbits. No acute intracranial abnormality or findings to explain patient's symptoms. 2. Mild age-related cerebral atrophy with chronic small vessel ischemic disease. 3. Moderate ethmoidal and maxillary sinusitis. Electronically Signed   By: Jeannine Boga M.D.   On: 05/27/2021 21:01    Procedures Procedures   Medications Ordered in ED Medications  aspirin tablet 325 mg (has no administration in time range)  LORazepam (ATIVAN) injection 1 mg (1 mg Intravenous Given 05/27/21 1850)  iohexol (OMNIPAQUE) 350 MG/ML injection 75 mL (75 mLs Intravenous Contrast Given 05/27/21 2029)    ED Course  I have reviewed the triage vital signs and the nursing notes.  Pertinent labs & imaging results that were available during my care of the patient were reviewed by me and considered in my medical decision making (see chart for details).    MDM Rules/Calculators/A&P                           CORNIE HERRINGTON is a 75 y.o. male here  presenting with left inferior visual field cut.  Concern for possible stroke.  Plan to get MRI brain and CTA head and neck.  PA discussed case with Dr. Quinn Axe from neurology.  Patient is outside the window for tPA and does not meet  LVO criteria so no code stroke was activated.  9:32 PM MRI brain and orbits unremarkable.  I discussed case with Dr. Lorrin Goodell from neurology.  He states that patient should be treated as if he has a small stroke.  Hospitalist to admit and transfer to [facility].  Neurology will see him as consult when he gets to [facility]   Final Clinical Impression(s) / ED Diagnoses Final diagnoses:  None    Rx / DC Orders ED Discharge Orders     None        Drenda Freeze, MD 05/27/21 2133

## 2021-05-27 NOTE — ED Notes (Signed)
Wife at bedside.

## 2021-05-28 ENCOUNTER — Observation Stay (HOSPITAL_BASED_OUTPATIENT_CLINIC_OR_DEPARTMENT_OTHER): Payer: Medicare Other

## 2021-05-28 DIAGNOSIS — I10 Essential (primary) hypertension: Secondary | ICD-10-CM | POA: Diagnosis not present

## 2021-05-28 DIAGNOSIS — G459 Transient cerebral ischemic attack, unspecified: Secondary | ICD-10-CM

## 2021-05-28 DIAGNOSIS — J4520 Mild intermittent asthma, uncomplicated: Secondary | ICD-10-CM | POA: Diagnosis not present

## 2021-05-28 DIAGNOSIS — H349 Unspecified retinal vascular occlusion: Secondary | ICD-10-CM | POA: Diagnosis not present

## 2021-05-28 DIAGNOSIS — G4733 Obstructive sleep apnea (adult) (pediatric): Secondary | ICD-10-CM | POA: Diagnosis not present

## 2021-05-28 DIAGNOSIS — E66813 Obesity, class 3: Secondary | ICD-10-CM

## 2021-05-28 LAB — LIPID PANEL
Cholesterol: 126 mg/dL (ref 0–200)
HDL: 36 mg/dL — ABNORMAL LOW (ref >40)
LDL Cholesterol: 51 mg/dL (ref 0–99)
Total CHOL/HDL Ratio: 3.5 RATIO
Triglycerides: 193 mg/dL — ABNORMAL HIGH (ref <150)
VLDL: 39 mg/dL (ref 0–40)

## 2021-05-28 LAB — URINALYSIS, ROUTINE W REFLEX MICROSCOPIC
Bilirubin Urine: NEGATIVE
Glucose, UA: NEGATIVE mg/dL
Hgb urine dipstick: NEGATIVE
Ketones, ur: NEGATIVE mg/dL
Leukocytes,Ua: NEGATIVE
Nitrite: NEGATIVE
Protein, ur: NEGATIVE mg/dL
Specific Gravity, Urine: >1.046 — ABNORMAL HIGH (ref 1.005–1.030)
pH: 5 (ref 5.0–8.0)

## 2021-05-28 LAB — RAPID URINE DRUG SCREEN, HOSP PERFORMED
Amphetamines: NOT DETECTED
Barbiturates: NOT DETECTED
Benzodiazepines: NOT DETECTED
Cocaine: NOT DETECTED
Opiates: NOT DETECTED
Tetrahydrocannabinol: NOT DETECTED

## 2021-05-28 LAB — ECHOCARDIOGRAM COMPLETE BUBBLE STUDY
Area-P 1/2: 2.75 cm2
S' Lateral: 3.3 cm

## 2021-05-28 MED ORDER — ASPIRIN 81 MG PO CHEW: 81.0000 mg | CHEWABLE_TABLET | Freq: Every day | ORAL | 0 refills | Status: AC

## 2021-05-28 MED ORDER — CLOPIDOGREL BISULFATE 75 MG PO TABS: 75.0000 mg | ORAL_TABLET | Freq: Every day | ORAL | 3 refills | Status: AC

## 2021-05-28 MED ORDER — CLOPIDOGREL BISULFATE 75 MG PO TABS
75.0000 mg | ORAL_TABLET | Freq: Every day | ORAL | Status: DC
  Administered 2021-05-28: 75 mg via ORAL
  Filled 2021-05-28: qty 1

## 2021-05-28 NOTE — Progress Notes (Signed)
TR[facility] night cross cover note:  Regarding this 75 year old male, who was admitted to [facility] earlier this evening from [facility] ED for further evaluation of branch retinal artery occlusion, including neurology evaluation, I was updated by consulted neurologist, Dr. Dr. Lorrin Goodell, regarding potential small right ICA dissection for which he recommends angiogram to further evaluate. Dr. Lorrin Goodell has discussed the case with Dr. Estanislado Pandy of IR, and the patient has been made NPO after MN in anticipation of angiogram.     Babs Bertin, DO Hospitalist

## 2021-05-28 NOTE — Progress Notes (Signed)
  Echocardiogram 2D Echocardiogram has been performed.  Erik Wiggins 05/28/2021, 10:37 AM

## 2021-05-28 NOTE — Progress Notes (Addendum)
STROKE TEAM PROGRESS NOTE   INTERVAL HISTORY No one is at the bedside at time of this exam.   But wife showed up shortly after we left. We again reviewed plan of care.  Patient continues to have vision loss in the right eye in the inferior and basal quadrant only.  MRI scan of the brain shows no acute abnormality and CT angiogram of brain and neck show mild calcification at both carotid bifurcations the right petrous carotid shows focal area of signal loss possibly ulcerated plaque rather than dissection which does not appear to be flow-limiting.  MRI scan of the orbits is normal.  Hemoglobin A1c is 6.0 urine drug screen is negative.  LDL cholesterol is 51 mg percent. Vitals:   05/28/21 0308 05/28/21 0505 05/28/21 0643 05/28/21 0723  BP: (!) 157/81 (!) 130/97 (!) 145/74 129/78  Pulse: (!) 58 61 (!) 53 (!) 59  Resp:   20 14  Temp: 97.7 F (36.5 C) 97.9 F (36.6 C) 97.8 F (36.6 C) (!) 97.5 F (36.4 C)  TempSrc: Oral Axillary Axillary Oral  SpO2: 97% 100% 96% 96%  Weight:      Height:       CBC:  Recent Labs  Lab 05/27/21 1743 05/27/21 1812  WBC 7.2  --   NEUTROABS 5.0  --   HGB 12.8* 12.9*  HCT 39.3 38.0*  MCV 89.5  --   PLT 122*  --    Basic Metabolic Panel:  Recent Labs  Lab 05/27/21 1743 05/27/21 1812  NA 139 142  K 3.6 3.6  CL 106 104  CO2 25  --   GLUCOSE 168* 167*  BUN 13 12  CREATININE 0.90 0.90  CALCIUM 9.3  --     Lipid Panel:  Recent Labs  Lab 05/28/21 0334  CHOL 126  TRIG 193*  HDL 36*  CHOLHDL 3.5  VLDL 39  LDLCALC 51    HgbA1c:  Recent Labs  Lab 05/27/21 1751  HGBA1C 6.0*   Urine Drug Screen:  Recent Labs  Lab 05/27/21 2312  LABOPIA NONE DETECTED  COCAINSCRNUR NONE DETECTED  LABBENZ NONE DETECTED  AMPHETMU NONE DETECTED  THCU NONE DETECTED  LABBARB NONE DETECTED    Alcohol Level  Recent Labs  Lab 05/27/21 1743  ETH <10    IMAGING past 24 hours CT Angio Head W or Wo Contrast  Result Date: 05/27/2021 CLINICAL DATA:  Initial  evaluation for right-sided blurry vision. EXAM: CT ANGIOGRAPHY HEAD AND NECK TECHNIQUE: Multidetector CT imaging of the head and neck was performed using the standard protocol during bolus administration of intravenous contrast. Multiplanar CT image reconstructions and MIPs were obtained to evaluate the vascular anatomy. Carotid stenosis measurements (when applicable) are obtained utilizing NASCET criteria, using the distal internal carotid diameter as the denominator. CONTRAST:  56mL OMNIPAQUE IOHEXOL 350 MG/ML SOLN COMPARISON:  MRI from earlier the same day. FINDINGS: CT HEAD FINDINGS Brain: Mild age-related cerebral atrophy with chronic small vessel ischemic disease. No acute intracranial hemorrhage. No acute large vessel territory infarct. No mass lesion, mass effect or midline shift. No hydrocephalus or extra-axial fluid collection. Vascular: No hyperdense vessel. Skull: Scalp soft tissues and calvarium within normal limits. Sinuses: Moderate mucosal thickening noted within the ethmoidal air cells and partially visualized maxillary sinuses. No mastoid effusion. Orbits: Globes and orbital soft tissues demonstrate no acute finding. Review of the MIP images confirms the above findings CTA NECK FINDINGS Aortic arch: Visualized aortic arch normal caliber with normal branch pattern. Mild atheromatous  change about the arch and origin of the great vessels without hemodynamically significant stenosis. Right carotid system: Right CCA patent from its origin to the bifurcation without stenosis. Eccentric calcified plaque at the right carotid bulb/proximal right ICA with associated stenosis of up to 40-50% by NASCET criteria. Right ICA patent distally without stenosis, dissection or occlusion. Left carotid system: Left CCA patent from its origin to the bifurcation without stenosis. Eccentric calcified plaque at the left carotid bulb/proximal left ICA with associated stenosis of up to 50-60% by NASCET criteria. Left ICA  patent distally without stenosis, dissection or occlusion. Vertebral arteries: Both vertebral arteries arise from the subclavian arteries. No proximal subclavian artery stenosis. Both vertebral arteries widely patent without stenosis, dissection or occlusion. Skeleton: No discrete or worrisome osseous lesions. Moderate multilevel spondylosis throughout the visualized spine. Other neck: Unremarkable. Upper chest: Unremarkable. Review of the MIP images confirms the above findings CTA HEAD FINDINGS Anterior circulation: On the right, there is subtle intimal irregularity with possible small focal filling defect involving the horizontal petrous right ICA (series 14, images 94, 96). Given the patient's history of blurry right vision, findings suspicious for a possible small focal dissection. A possible small ruptured plaque with intraluminal thrombus could also be considered. No frank raised dissection flap or significant stenosis. Right ICA otherwise patent distally to the terminus. Contralateral horizontal petrous left ICA patent. Mild atheromatous change within the left carotid siphon without significant stenosis. A1 segments patent bilaterally. Normal anterior communicating artery complex. Anterior cerebral arteries patent to their distal aspects without stenosis. No M1 stenosis or occlusion. Normal MCA bifurcations. Distal MCA branches perfused and symmetric. Posterior circulation: Both V4 segments patent to the vertebrobasilar junction without stenosis. Left vertebral artery dominant. Both PICA origins patent and normal. Basilar patent to its distal aspect without stenosis. Superior cerebellar arteries patent bilaterally. Both PCAs primarily supplied via the basilar and are well perfused to their distal aspects. Venous sinuses: Patent allowing for timing the contrast. Anatomic variants: None significant. Review of the MIP images confirms the above findings IMPRESSION: 1. Subtle intimal irregularity with small focal  filling defects involving the horizontal petrous right ICA. Given the patient's history of right-sided blurry vision, finding is suspicious for a possible small focal dissection. A possible small ruptured plaque with intraluminal thrombus could also be considered. No frank raised dissection flap or significant stenosis. 2. Atheromatous plaque about the carotid bifurcations/proximal ICAs with associated stenoses of up to 40-50% on the right and 50-60% on the left. 3. Otherwise wide patency of the major arterial vasculature of the head and neck. No other hemodynamically significant or correctable stenosis. 4. No other acute intracranial abnormality. Electronically Signed   By: Jeannine Boga M.D.   On: 05/27/2021 22:32   CT Angio Neck W and/or Wo Contrast  Result Date: 05/27/2021 CLINICAL DATA:  Initial evaluation for right-sided blurry vision. EXAM: CT ANGIOGRAPHY HEAD AND NECK TECHNIQUE: Multidetector CT imaging of the head and neck was performed using the standard protocol during bolus administration of intravenous contrast. Multiplanar CT image reconstructions and MIPs were obtained to evaluate the vascular anatomy. Carotid stenosis measurements (when applicable) are obtained utilizing NASCET criteria, using the distal internal carotid diameter as the denominator. CONTRAST:  9mL OMNIPAQUE IOHEXOL 350 MG/ML SOLN COMPARISON:  MRI from earlier the same day. FINDINGS: CT HEAD FINDINGS Brain: Mild age-related cerebral atrophy with chronic small vessel ischemic disease. No acute intracranial hemorrhage. No acute large vessel territory infarct. No mass lesion, mass effect or midline shift. No hydrocephalus  or extra-axial fluid collection. Vascular: No hyperdense vessel. Skull: Scalp soft tissues and calvarium within normal limits. Sinuses: Moderate mucosal thickening noted within the ethmoidal air cells and partially visualized maxillary sinuses. No mastoid effusion. Orbits: Globes and orbital soft tissues  demonstrate no acute finding. Review of the MIP images confirms the above findings CTA NECK FINDINGS Aortic arch: Visualized aortic arch normal caliber with normal branch pattern. Mild atheromatous change about the arch and origin of the great vessels without hemodynamically significant stenosis. Right carotid system: Right CCA patent from its origin to the bifurcation without stenosis. Eccentric calcified plaque at the right carotid bulb/proximal right ICA with associated stenosis of up to 40-50% by NASCET criteria. Right ICA patent distally without stenosis, dissection or occlusion. Left carotid system: Left CCA patent from its origin to the bifurcation without stenosis. Eccentric calcified plaque at the left carotid bulb/proximal left ICA with associated stenosis of up to 50-60% by NASCET criteria. Left ICA patent distally without stenosis, dissection or occlusion. Vertebral arteries: Both vertebral arteries arise from the subclavian arteries. No proximal subclavian artery stenosis. Both vertebral arteries widely patent without stenosis, dissection or occlusion. Skeleton: No discrete or worrisome osseous lesions. Moderate multilevel spondylosis throughout the visualized spine. Other neck: Unremarkable. Upper chest: Unremarkable. Review of the MIP images confirms the above findings CTA HEAD FINDINGS Anterior circulation: On the right, there is subtle intimal irregularity with possible small focal filling defect involving the horizontal petrous right ICA (series 14, images 94, 96). Given the patient's history of blurry right vision, findings suspicious for a possible small focal dissection. A possible small ruptured plaque with intraluminal thrombus could also be considered. No frank raised dissection flap or significant stenosis. Right ICA otherwise patent distally to the terminus. Contralateral horizontal petrous left ICA patent. Mild atheromatous change within the left carotid siphon without significant  stenosis. A1 segments patent bilaterally. Normal anterior communicating artery complex. Anterior cerebral arteries patent to their distal aspects without stenosis. No M1 stenosis or occlusion. Normal MCA bifurcations. Distal MCA branches perfused and symmetric. Posterior circulation: Both V4 segments patent to the vertebrobasilar junction without stenosis. Left vertebral artery dominant. Both PICA origins patent and normal. Basilar patent to its distal aspect without stenosis. Superior cerebellar arteries patent bilaterally. Both PCAs primarily supplied via the basilar and are well perfused to their distal aspects. Venous sinuses: Patent allowing for timing the contrast. Anatomic variants: None significant. Review of the MIP images confirms the above findings IMPRESSION: 1. Subtle intimal irregularity with small focal filling defects involving the horizontal petrous right ICA. Given the patient's history of right-sided blurry vision, finding is suspicious for a possible small focal dissection. A possible small ruptured plaque with intraluminal thrombus could also be considered. No frank raised dissection flap or significant stenosis. 2. Atheromatous plaque about the carotid bifurcations/proximal ICAs with associated stenoses of up to 40-50% on the right and 50-60% on the left. 3. Otherwise wide patency of the major arterial vasculature of the head and neck. No other hemodynamically significant or correctable stenosis. 4. No other acute intracranial abnormality. Electronically Signed   By: Jeannine Boga M.D.   On: 05/27/2021 22:32   MR BRAIN WO CONTRAST  Result Date: 05/27/2021 CLINICAL DATA:  Initial evaluation for right-sided blurry vision. EXAM: MRI HEAD AND ORBITS WITHOUT CONTRAST TECHNIQUE: Multiplanar, multi-echo pulse sequences of the brain and surrounding structures were acquired without intravenous contrast. Multiplanar, multi-echo pulse sequences of the orbits and surrounding structures were  acquired including fat saturation techniques, without intravenous contrast  administration. COMPARISON:  None. FINDINGS: MRI HEAD FINDINGS Brain: Examination degraded by motion artifact. Generalized age-related cerebral atrophy. Mild scattered T2/FLAIR hyperintensity noted involving the periventricular white matter, most like related chronic microvascular ischemic disease, overall fairly minimal for age. No abnormal foci of restricted diffusion to suggest acute ischemia. No areas of chronic cortical infarction. No evidence for acute or chronic intracranial hemorrhage. No mass lesion or midline shift. No hydrocephalus or extra-axial fluid collection. Pituitary gland and suprasellar region normal. Midline structures intact. Vascular: Major intracranial vascular flow voids are maintained. Skull and upper cervical spine: Craniocervical junction normal. Bone marrow signal intensity normal. No scalp soft tissue abnormality. Other: Trace left mastoid effusion noted, of doubtful significance. MRI ORBITS FINDINGS Orbits: Globes are symmetric in size with normal morphology. Patient status post bilateral ocular lens replacement. Optic nerves symmetric with normal noncontrast appearance. Extra-ocular muscles symmetric and within normal limits. Intraconal and extraconal fat well-maintained. Lacrimal glands normal. No abnormality about the orbital apices or cavernous sinus. Superior orbital veins symmetric and within normal limits. Optic chiasm normally situated within the suprasellar cistern. Visualized optic radiations normal. Visualized sinuses: Moderate mucosal thickening seen throughout the ethmoidal air cells and maxillary sinuses. Soft tissues: Unremarkable. IMPRESSION: 1. Negative MRI of the brain and orbits. No acute intracranial abnormality or findings to explain patient's symptoms. 2. Mild age-related cerebral atrophy with chronic small vessel ischemic disease. 3. Moderate ethmoidal and maxillary sinusitis.  Electronically Signed   By: Jeannine Boga M.D.   On: 05/27/2021 21:01   MR ORBITS WO CONTRAST  Result Date: 05/27/2021 CLINICAL DATA:  Initial evaluation for right-sided blurry vision. EXAM: MRI HEAD AND ORBITS WITHOUT CONTRAST TECHNIQUE: Multiplanar, multi-echo pulse sequences of the brain and surrounding structures were acquired without intravenous contrast. Multiplanar, multi-echo pulse sequences of the orbits and surrounding structures were acquired including fat saturation techniques, without intravenous contrast administration. COMPARISON:  None. FINDINGS: MRI HEAD FINDINGS Brain: Examination degraded by motion artifact. Generalized age-related cerebral atrophy. Mild scattered T2/FLAIR hyperintensity noted involving the periventricular white matter, most like related chronic microvascular ischemic disease, overall fairly minimal for age. No abnormal foci of restricted diffusion to suggest acute ischemia. No areas of chronic cortical infarction. No evidence for acute or chronic intracranial hemorrhage. No mass lesion or midline shift. No hydrocephalus or extra-axial fluid collection. Pituitary gland and suprasellar region normal. Midline structures intact. Vascular: Major intracranial vascular flow voids are maintained. Skull and upper cervical spine: Craniocervical junction normal. Bone marrow signal intensity normal. No scalp soft tissue abnormality. Other: Trace left mastoid effusion noted, of doubtful significance. MRI ORBITS FINDINGS Orbits: Globes are symmetric in size with normal morphology. Patient status post bilateral ocular lens replacement. Optic nerves symmetric with normal noncontrast appearance. Extra-ocular muscles symmetric and within normal limits. Intraconal and extraconal fat well-maintained. Lacrimal glands normal. No abnormality about the orbital apices or cavernous sinus. Superior orbital veins symmetric and within normal limits. Optic chiasm normally situated within the  suprasellar cistern. Visualized optic radiations normal. Visualized sinuses: Moderate mucosal thickening seen throughout the ethmoidal air cells and maxillary sinuses. Soft tissues: Unremarkable. IMPRESSION: 1. Negative MRI of the brain and orbits. No acute intracranial abnormality or findings to explain patient's symptoms. 2. Mild age-related cerebral atrophy with chronic small vessel ischemic disease. 3. Moderate ethmoidal and maxillary sinusitis. Electronically Signed   By: Jeannine Boga M.D.   On: 05/27/2021 21:01    PHYSICAL EXAM  Mental status/Cognition: Alert, oriented to self, place, month and year, good attention.  Speech/language: Fluent, comprehension intact,  object naming intact, repetition intact. Cranial nerves:   CN II Pupils equal and reactive to light, right monocular inferior nasal visual quadrantic field defect.   CN III,IV,VI EOM intact, no gaze preference or deviation, no nystagmus    CN V normal sensation in V1, V2, and V3 segments bilaterally    CN VII no asymmetry, no nasolabial fold flattening    CN VIII normal hearing to speech    CN IX & X normal palatal elevation, no uvular deviation    CN XI 5/5 head turn and 5/5 shoulder shrug bilaterally    CN XII midline tongue protrusion     Motor:  Muscle bulk: normal, tone normal, pronator drift none tremor none. 5/5 strength throughout.   Coordination/Complex Motor:  - Finger to Nose intact bilaterally - Heel to shin intact bilaterally - Rapid alternating movement are normal - Gait: Stride length normal. Arm swing poor. Base width narrow  Sensation: intact throughout.    ASSESSMENT/PLAN Mr. NAM VOSSLER is a 75 y.o. male with history of  hypertension, hyperlipidemia, obstructive sleep apnea on CPAP, asthma who presents with right monocular inferior nasal field visual deficit.   Patient reports that symptoms started at 0900 on 05/27/2021.  He got up in the morning and 0600 on 05/27/2021 and was at his baseline.   He was driving down the road at 0900 on 811/02/2021 when he had sudden onset loss of vision/dark spot in the right monocular inferior nasal visual field.  He thought this was in his sunglasses and try to remove his glasses but the spot persisted.  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  He was sent to the ED for further evaluation and work-up.  He was outside of window for TNKase at the time of presentation to the emergency department.  Work-up with MRI brain and orbit without contrast with no acute abnormality.  CT angio head and neck was obtained and concerns for subtle intimal irregularity with possible small focal filling defect in the horizontal petrous right ICA which is suspicious for a possible small focal dissection.  Other consideration include small ruptured plaque with intraluminal thrombus.  No significant stenosis and no frank raised dissection flap noted.  He will have an angiogram today.    Right eye monocular inferior nasal visual field defect likely from retinal artery branch occlusion etiology likely arterial sclerosis:     CTA head & neck   Subtle intimal irregularity with small focal filling defects involving the horizontal petrous right ICA. Given the patient's history of right-sided blurry vision, finding is suspicious for a possible small focal dissection. A possible small ruptured plaque with intraluminal thrombus could also be considered. No frank raised dissection flap or significant stenosis. 2. Atheromatous plaque about the carotid bifurcations/proximal ICAs with associated stenoses of up to 40-50% on the right and 50-60% on the left. 3. Otherwise wide patency of the major arterial vasculature of the head and neck. No other hemodynamically significant or correctable stenosis. 4. No other acute intracranial abnormality.  Cerebral angio pending Post IR CT pending MRI  BRAIN:  1. Negative MRI of the brain and  orbits. No acute intracranial abnormality or findings to explain patient's symptoms. 2. Mild age-related cerebral atrophy with chronic small vessel ischemic disease. 3. Moderate ethmoidal and maxillary sinusitis.  2D Echo pending   LDL 51 HgbA1c 6.0 VTE prophylaxis - scd    Diet   Diet NPO time specified  aspirin 81 mg daily prior to admission, now on aspirin 81 mg daily and clopidogrel 75 mg daily.  Dual antiplatelet therapy for 3 weeks then single agent only (use plavix as patient was on aspirin prior to hospitalization) .  Therapy recommendations:  no f/u Disposition:  home  Hypertension Home meds:  none Stable Permissive hypertension (OK if < 220/120) but gradually normalize in 5-7 days Long-term BP goal normotensive  Hyperlipidemia Home meds:  crestor 20mg  ,  resumed in hospital LDL 51, goal < 70 Continue statin at discharge  HgbA1c 6.0, goal < 7.0 CBGs No results for input(s): GLUCAP in the last 72 hours.  SSI  Other Stroke Risk Factors  Advanced Age >/= 49    Obesity, Body mass index is 45.63 kg/m., BMI >/= 30 associated with increased stroke risk, recommend weight loss, diet and exercise as appropriate     Other Active Problems    Hospital day # 0   I have personally obtained history,examined this patient, reviewed notes, independently viewed imaging studies, participated in medical decision making and plan of care.ROS completed by me personally and pertinent positives fully documented  I have made any additions or clarifications directly to the above note. Agree with note above.  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  Recommend dual antiplatelet therapy aspirin Plavix for 3 weeks followed by Plavix alone and aggressive risk factor modification.  Patient counseled to eat  a healthy diet and exercise regularly and lose weight.  Continue to be compliant with using CPAP for his sleep apnea.  Patient advised not to drive or while till he gets used to his vision loss.  Follow-up as an outpatient stroke clinic in 2 months.  Greater than 50% time during this 35-minute visit was spent in counseling and coordination of care and discussion with care team and answering questions.  Discussed with patient and wife  Antony Contras, [place] Pager: [phone] 05/28/2021 4:23 PM   To contact Stroke Continuity provider, please refer to [email or web address] After hours, contact General Neurology

## 2021-05-28 NOTE — Evaluation (Signed)
Speech Language Pathology Evaluation Patient Details Name: Erik Wiggins MRN: [id number] DOB: [birth year] Today's Date: 05/28/2021 Time: 5027-7412 SLP Time Calculation (min) (ACUTE ONLY): 17 min  Problem List:  Patient Active Problem List   Diagnosis Date Noted   HTN (hypertension) 05/28/2021   Obesity, Class III, BMI 40-49.9 (morbid obesity) ([place]) 05/28/2021   Stroke ([place]) 05/27/2021   Unilateral primary osteoarthritis, left knee 11/25/2020   History of nasal polyp 87/86/7672   Umbilical hernia 09/47/0962   Moderate persistent asthma without complication 83/66/2947   Asthma, mild intermittent, well-controlled 12/22/2014   Other allergic rhinitis 06/21/2014   Nasal polyps 06/21/2014   Obesity 06/21/2014   OSA (obstructive sleep apnea) 06/05/2014   Past Medical History:  Past Medical History:  Diagnosis Date   Allergic rhinitis    Androgen deficiency    Asthma    Asthma, mild intermittent, well-controlled 12/22/2014   Erectile dysfunction    High cholesterol    Hyperlipemia    Hypertension    Low testosterone    Major depressive disorder, recurrent episode, in partial remission ([place])    Metatarsalgia    Moderate persistent asthma, uncomplicated 65/46/5035   Nasal polyps 06/21/2014   Demonstrated on previous sinus CT by report. No history of aspirin intolerance    Obesity 06/21/2014   Onychomycosis    OSA (obstructive sleep apnea)    OSA, uses CPAP nightly   Perennial allergic rhinitis 06/21/2014   Allergy vaccine started 2012 in [place]    Umbilical hernia    Vitamin D deficiency    Past Surgical History:  Past Surgical History:  Procedure Laterality Date   CARDIAC CATHETERIZATION     COLONSCOPY     EYE SURGERY     cataracts   INSERTION OF MESH N/A 02/11/2018   Procedure: INSERTION OF MESH;  Surgeon: Armandina Gemma, MD;  Location: [place];  Service: General;  Laterality: N/A;   UMBILICAL HERNIA REPAIR N/A 02/11/2018   Procedure: OPEN REPAIR  UMBILICAL HERNIA;  Surgeon: Armandina Gemma, MD;  Location: [place];  Service: General;  Laterality: N/A;   HPI:  75yo male admitted 05/27/21 with right partial vision loss. PMH: HTN, asthma, ODA, HLD, morbid obesity, depression, OSA. MRI negative   Assessment / Plan / Recommendation Clinical Impression  Pt seen for evaluation of cognitive linguistic function. Pt presents with speech, language, and cognition within functional limits. No further ST intervention recommended at this time. Please reconsult if needs arise.    SLP Assessment  SLP Recommendation/Assessment: Patient does not need any further Speech Language Pathology Services    Recommendations for follow up therapy are one component of a multi-disciplinary discharge planning process, led by the attending physician.  Recommendations may be updated based on patient status, additional functional criteria and insurance authorization.    Follow Up Recommendations  No SLP follow up    Assistance Recommended at Discharge  None  Functional Status Assessment  [facility]     SLP Evaluation Cognition  Overall Cognitive Status: Within Functional Limits for tasks assessed Arousal/Alertness: Awake/alert Orientation Level: Oriented X4       Comprehension  Auditory Comprehension Overall Auditory Comprehension: Appears within functional limits for tasks assessed    Expression Expression Primary Mode of Expression: Verbal Verbal Expression Overall Verbal Expression: Appears within functional limits for tasks assessed Written Expression Dominant Hand: Left   Oral / Motor  Oral Motor/Sensory Function Overall Oral Motor/Sensory Function: Within functional limits Motor Speech Overall Motor Speech: Appears within functional limits  for tasks assessed Intelligibility: Intelligible   GO                   Erik Wiggins B. Quentin Ore, [facility], [place] Speech Language Pathologist Office: [phone]  Shonna Chock 05/28/2021, 9:20  AM

## 2021-05-28 NOTE — Care Management Obs Status (Signed)
[facility] NOTIFICATION   Patient Details  Name: Erik Wiggins MRN: [id number] Date of Birth: 24-Oct-1945   Medicare Observation Status Notification Given:  Yes    Joanne Chars, LCSW 05/28/2021, 3:37 PM

## 2021-05-28 NOTE — Consult Note (Signed)
NEUROLOGY CONSULTATION NOTE   Date of service: May 28, 2021 Patient Name: Erik Wiggins MRN:  [id number] DOB:  17-Jan-1946 Reason for consult: "BRAO" Requesting Provider: Orene Desanctis, DO _ _ _   _ __   _ __ _ _  __ __   _ __   __ _  History of Present Illness  Erik Wiggins is a 75 y.o. male with PMH significant for hypertension, hyperlipidemia, obstructive sleep apnea on CPAP, asthma who presents with right monocular inferior nasal field visual deficit.  Patient reports that symptoms started at 0900 on 05/27/2021.  He got up in the morning and 0600 on 05/27/2021 and was at his baseline.  He was driving down the road at 0900 on 811/02/2021 when he had sudden onset loss of vision/dark spot in the right monocular inferior nasal visual field.  He thought this was in his sunglasses and try to remove his glasses but the spot persisted.  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  He was sent to the ED for further evaluation and work-up.  He was outside of window for TNKase at the time of presentation to the emergency department.  Work-up with MRI brain and orbit without contrast with no acute abnormality.  CT angio head and neck was obtained and concerns for subtle intimal irregularity with possible small focal filling defect in the horizontal petrous right ICA which is suspicious for a possible small focal dissection.  Other consideration include small ruptured plaque with intraluminal thrombus.  No significant stenosis and no frank raised dissection flap noted.  Neurology was consulted for further evaluation and work-up.  Erik Wiggins LKW: 0900 and 05/27/2021. TNKase: Not offered as he was outside the window at the time of presentation to the ED. Thrombectomy: Not offered due to no LVO.  NIHSS components Score: Comment  1a Level of Conscious Wiggins[x]  1[]  2[]  3[]      1b LOC Questions Wiggins[x]  1[]  2[]       1c LOC Commands Wiggins[x]  1[]  2[]       2 Best Gaze  Wiggins[x]  1[]  2[]       3 Visual Wiggins[]  1[x]  2[]  3[]      4 Facial Palsy Wiggins[x]  1[]  2[]  3[]      5a Motor Arm - left Wiggins[x]  1[]  2[]  3[]  4[]  UN[]    5b Motor Arm - Right Wiggins[x]  1[]  2[]  3[]  4[]  UN[]    6a Motor Leg - Left Wiggins[x]  1[]  2[]  3[]  4[]  UN[]    6b Motor Leg - Right Wiggins[x]  1[]  2[]  3[]  4[]  UN[]    7 Limb Ataxia Wiggins[x]  1[]  2[]  3[]  UN[]     8 Sensory Wiggins[x]  1[]  2[]  UN[]      9 Best Language Wiggins[x]  1[]  2[]  3[]      10 Dysarthria Wiggins[x]  1[]  2[]  UN[]      11 Extinct. and Inattention Wiggins[x]  1[]  2[]       TOTAL: 1      ROS   Constitutional Denies weight loss, fever and chills.   HEENT Denies changes in vision and hearing.   Respiratory Denies SOB and cough.   CV Denies palpitations and CP   GI Denies abdominal pain, nausea, vomiting and diarrhea.   GU Denies dysuria and urinary frequency.   MSK Denies myalgia and joint pain.   Skin Denies rash and pruritus.   Neurological Denies headache and syncope.   Psychiatric Denies recent changes in mood. Denies anxiety and depression.    Past History   Past Medical  History:  Diagnosis Date   Allergic rhinitis    Androgen deficiency    Asthma    Asthma, mild intermittent, well-controlled 12/22/2014   Erectile dysfunction    High cholesterol    Hyperlipemia    Hypertension    Low testosterone    Major depressive disorder, recurrent episode, in partial remission ([place])    Metatarsalgia    Moderate persistent asthma, uncomplicated 79/89/2119   Nasal polyps 06/21/2014   Demonstrated on previous sinus CT by report. No history of aspirin intolerance    Obesity 06/21/2014   Onychomycosis    OSA (obstructive sleep apnea)    OSA, uses CPAP nightly   Perennial allergic rhinitis 06/21/2014   Allergy vaccine started 2012 in [place]    Umbilical hernia    Vitamin D deficiency    Past Surgical History:  Procedure Laterality Date   CARDIAC CATHETERIZATION     COLONSCOPY     EYE SURGERY     cataracts   INSERTION OF MESH N/A 02/11/2018   Procedure: INSERTION OF MESH;   Surgeon: Armandina Gemma, MD;  Location: [place];  Service: General;  Laterality: N/A;   UMBILICAL HERNIA REPAIR N/A 02/11/2018   Procedure: OPEN REPAIR UMBILICAL HERNIA;  Surgeon: Armandina Gemma, MD;  Location: [place];  Service: General;  Laterality: N/A;   Family History  Problem Relation Age of Onset   Healthy Mother    Heart attack Father 42   AAA (abdominal aortic aneurysm) Father    Healthy Son    Healthy Son    Healthy Son    Cancer Maternal Grandfather    Multiple sclerosis Son        youngest   Allergic rhinitis Neg Hx    Angioedema Neg Hx    Asthma Neg Hx    Atopy Neg Hx    Eczema Neg Hx    Immunodeficiency Neg Hx    Urticaria Neg Hx    Social History   Socioeconomic History   Marital status: Married    Spouse name: Not on file   Number of children: 3   Years of education: Not on file   Highest education level: Not on file  Occupational History   Occupation: Hotel manager  Tobacco Use   Smoking status: Former    Types: Cigars    Quit date: 07/21/1995    Years since quitting: 25.8   Smokeless tobacco: Never  Vaping Use   Vaping Use: Never used  Substance and Sexual Activity   Alcohol use: Not Currently    Comment: social   Drug use: No   Sexual activity: Not on file    Comment: MARRIED  Other Topics Concern   Not on file  Social History Narrative   Not on file   Social Determinants of Health   Financial Resource Strain: Not on file  Food Insecurity: Not on file  Transportation Needs: Not on file  Physical Activity: Not on file  Stress: Not on file  Social Connections: Not on file   Allergies  Allergen Reactions   Lisinopril Swelling and Other (See Comments)    Swollen lips   Sulfa Antibiotics Hives   Sulfacetamide Sodium Other (See Comments)    Medications   Medications Prior to Admission  Medication Sig Dispense Refill Last Dose   albuterol (PROAIR HFA) 108 (90 Base) MCG/ACT inhaler Inhale 4  puffs into the lungs every 4 (four) hours as needed for wheezing or shortness of breath. 2 puffs every  6 hrs as needed (Patient taking differently: Inhale 4 puffs into the lungs every 4 (four) hours as needed for wheezing or shortness of breath.) 1 Inhaler 2 Past Month   aspirin 81 MG chewable tablet Chew 81 mg by mouth daily.   05/27/2021   azelastine (ASTELIN) Wiggins.1 % nasal spray Place 1 spray into both nostrils 2 (two) times daily. Use in each nostril as directed (Patient taking differently: Place 1 spray into both nostrils daily.) 90 mL 1 05/26/2021   cetirizine (ZYRTEC) 10 MG tablet Take 10 mg by mouth daily.   05/27/2021   Cholecalciferol (VITAMIN D) 2000 units CAPS Take 2,000 Units by mouth 2 (two) times daily.   05/27/2021   indapamide (LOZOL) 2.5 MG tablet Take 2.5 mg by mouth daily.    05/27/2021   ipratropium (ATROVENT) Wiggins.03 % nasal spray Place 1 spray into both nostrils daily as needed for rhinitis.  6 Past Week   montelukast (SINGULAIR) 10 MG tablet Take 10 mg by mouth at bedtime.    05/26/2021   Multiple Vitamin (MULTIVITAMIN WITH MINERALS) TABS tablet Take 1 tablet by mouth daily. Men's  One A Day over 50   05/27/2021   naproxen sodium (ALEVE) 220 MG tablet Take 440 mg by mouth daily as needed (knee pain).   Past Week   Polyethyl Glycol-Propyl Glycol Wiggins.4-Wiggins.3 % SOLN Place 2 drops into both eyes daily as needed (for dry eyes).   05/27/2021   rosuvastatin (CRESTOR) 20 MG tablet Take 1 tablet (20 mg total) by mouth daily. 90 tablet 3 05/26/2021   sertraline (ZOLOFT) 50 MG tablet Take 50 mg by mouth daily.    05/27/2021   tobramycin (TOBREX) Wiggins.3 % ophthalmic solution Place 1 drop into the left eye 4 (four) times daily.   Past Week   Triamcinolone Acetonide (NASACORT ALLERGY 24HR NA) Place 1 spray into the nose daily as needed (congestion).   05/22/2021   TURMERIC CURCUMIN PO Take 1,500 mg by mouth daily.   05/26/2021   erythromycin ophthalmic ointment Place a 1/2 inch ribbon of ointment into the lower  eyelid four times a day. (Patient not taking: No sig reported) 3.5 g Wiggins Not Taking   Fluticasone Propionate (XHANCE) 93 MCG/ACT EXHU Place 2 sprays into both nostrils 2 (two) times daily. 32 mL 5      Vitals   Vitals:   05/27/21 2026 05/27/21 2120 05/27/21 2210 05/27/21 2328  BP: 136/78 (!) 153/80 (!) 148/89 131/70  Pulse: 62 66 69 61  Resp: 18  19 16   Temp:    98.5 F (36.9 C)  TempSrc:    Oral  SpO2: 97% 95% 94% 97%  Weight:      Height:         Body mass index is 45.63 kg/m.  Physical Exam   General: Laying comfortably in bed; in no acute distress.  HENT: Normal oropharynx and mucosa. Normal external appearance of ears and nose.  Neck: Supple, no pain or tenderness  CV: No JVD. No peripheral edema.  Pulmonary: Symmetric Chest rise. Normal respiratory effort.  Abdomen: Soft to touch, non-tender.  Ext: No cyanosis, edema, or deformity  Skin: No rash. Normal palpation of skin.   Musculoskeletal: Normal digits and nails by inspection. No clubbing.   Neurologic Examination  Mental status/Cognition: Alert, oriented to self, place, month and year, good attention.  Speech/language: Fluent, comprehension intact, object naming intact, repetition intact. Cranial nerves:   CN II Pupils equal and reactive to light, right monocular  inferior nasal visual field defect.   CN III,IV,VI EOM intact, no gaze preference or deviation, no nystagmus    CN V normal sensation in V1, V2, and V3 segments bilaterally    CN VII no asymmetry, no nasolabial fold flattening    CN VIII normal hearing to speech    CN IX & X normal palatal elevation, no uvular deviation    CN XI 5/5 head turn and 5/5 shoulder shrug bilaterally    CN XII midline tongue protrusion    Motor:  Muscle bulk: normal, tone normal, pronator drift none tremor none Mvmt Root Nerve  Muscle Right Left Comments  SA C5/6 Ax Deltoid 5 5   EF C5/6 Mc Biceps 5 5   EE C6/7/8 Rad Triceps 5 5   WF C6/7 Med FCR     WE C7/8 PIN ECU      F Ab C8/T1 U ADM/FDI 5 5   HF L1/2/3 Fem Illopsoas 5 5   KE L2/3/4 Fem Quad 5 5   DF L4/5 D Peron Tib Ant 5 5   PF S1/2 Tibial Grc/Sol 5 5    Reflexes:  Right Left Comments  Pectoralis      Biceps (C5/6) 1 1   Brachioradialis (C5/6) 1 1    Triceps (C6/7) 1 1    Patellar (L3/4) 1 1    Achilles (S1)      Hoffman      Plantar     Jaw jerk    Sensation:  Light touch Intact throughout   Pin prick    Temperature    Vibration   Proprioception    Coordination/Complex Motor:  - Finger to Nose intact bilaterally - Heel to shin intact bilaterally - Rapid alternating movement are normal - Gait: Stride length normal. Arm swing poor. Base width narrow  Labs   CBC:  Recent Labs  Lab 05/27/21 1743 05/27/21 1812  WBC 7.2  --   NEUTROABS 5.Wiggins  --   HGB 12.8* 12.9*  HCT 39.3 38.Wiggins*  MCV 89.5  --   PLT 122*  --     Basic Metabolic Panel:  Lab Results  Component Value Date   NA 142 05/27/2021   K 3.6 05/27/2021   CO2 25 05/27/2021   GLUCOSE 167 (H) 05/27/2021   BUN 12 05/27/2021   CREATININE Wiggins.90 05/27/2021   CALCIUM 9.3 05/27/2021   GFRNONAA >60 05/27/2021   Lipid Panel: No results found for: LDLCALC HgbA1c:  Lab Results  Component Value Date   HGBA1C 6.Wiggins (H) 05/27/2021   Urine Drug Screen: No results found for: LABOPIA, COCAINSCRNUR, LABBENZ, AMPHETMU, THCU, LABBARB  Alcohol Level     Component Value Date/Time   ETH <10 05/27/2021 1743   CT angio Head and Neck with contrast(personally reviewed): CT angio head and neck was obtained and concerns for subtle intimal irregularity with possible small focal filling defect in the horizontal petrous right ICA which is suspicious for a possible small focal dissection.  Other consideration include small ruptured plaque with intraluminal thrombus.  No significant stenosis and no frank raised dissection flap noted.  MR orbits without contrast: Personally reviewed and no acute intracranial abnormality.  MRI  Brain: Personally reviewed and no intracranial abnormality.  Impression   SIAOSI ALTER is a 75 y.o. male with PMH significant for hypertension, hyperlipidemia, obstructive sleep apnea on CPAP, asthma who presents with right monocular inferior nasal field visual deficit.  He was outside [facility] window at the time of presentation to  the ED.  CTA is concerning for a small focal dissection in the horizontal petrous right ICA versus a small ruptured plaque with intraluminal thrombus but there is no significant narrowing or stenosis. He could have had an emboli originating from the petrous R ICA into the R ophthalmic artery causing branch retinal artery occlusion. I think he would benefit from an agiogram in the AM. For now, we will do dual antiplatelet therapy with Aspirin and plavix and monitor overnight.  Primary Diagnosis:  Branch retinal artery occlusion.  Secondary Diagnosis: Essential (primary) hypertension and Morbid Obesity(BMI > 40) hypercholesterolemia and obstructive sleep apnea on CPAP.  Recommendations  Plan:  Recommend that primary team order following: - Frequent Neuro checks per stroke unit protocol - Recommend obtaining TTE - Recommend obtaining Lipid panel with LDL - Please start statin if LDL > 70 - Recommend HbA1c - Antithrombotic - Aspirin 81mg  daily along with plavix 75mg  daily x 90 days, followed by Aspirin 81mg  daily alone. - Recommend DVT ppx - SBP goal - permissive hypertension first 24 h < 220/110. Held home meds.  - Recommend Telemetry monitoring for arrythmia - Recommend bedside swallow screen prior to PO intake. - Stroke education booklet - Recommend PT/OT/SLP consult - Would likely benefit from Angiogram in AM. Discussed with Dr. Estanislado Pandy overnight. Will make him NPO overnight.  ______________________________________________________________________  Plan discussed with Dr. Velia Meyer with the Hospitalist team overnight.  Thank you for the opportunity to  take part in the care of this patient. If you have any further questions, please contact the neurology consultation attending.  Signed,  [place] Pager Number [phone] _ _ _   _ __   _ __ _ _  __ __   _ __   __ _

## 2021-05-28 NOTE — Evaluation (Signed)
Physical Therapy Evaluation Patient Details Name: Erik Wiggins MRN: [id number] DOB: 01/01/1946 Today's Date: 05/28/2021  History of Present Illness  Patient is a 75 y/o male admitted with sudden onset R visual deficits.  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  PMH positive for HTN, asthma, OSA, hyperlipidemia and morbid obesity.  Clinical Impression  Patient presents with mobility close to baseline.  He was able to complete both Berg balance assessment and scored 48/56 with some difficulty with speed of turning and single limb stance activities, which are baseline per pt report.  Scored 21/24 on DGI showing low fall risk for community mobility.  Feel he is stable for home without follow up PT and no acute level PT needs.  If not d/c today recommend nursing assist with hallway ambulation to avoid stiffness of joints and deconditioning. PT to sign off.        Recommendations for follow up therapy are one component of a multi-disciplinary discharge planning process, led by the attending physician.  Recommendations may be updated based on patient status, additional functional criteria and insurance authorization.  Follow Up Recommendations No PT follow up    Assistance Recommended at Discharge PRN  Functional Status Assessment Patient has not had a recent decline in their functional status  Equipment Recommendations  None recommended by PT    Recommendations for Other Services       Precautions / Restrictions Precautions Precautions: None      Mobility  Bed Mobility Overal bed mobility: Modified Independent                  Transfers Overall transfer level: Modified independent                      Ambulation/Gait Ambulation/Gait assistance: Independent Gait Distance (Feet): 250 Feet Assistive device: None Gait Pattern/deviations: Step-through pattern;WFL(Within Functional Limits)       General Gait Details: see DGI  Stairs Stairs: Yes Stairs  assistance: Modified independent (Device/Increase time) Stair Management: Two rails;Step to pattern;Forwards Number of Stairs: 3 General stair comments: repors usually leads down with L first due to knee pain  Wheelchair Mobility    Modified Rankin (Stroke Patients Only) Modified Rankin (Stroke Patients Only) Pre-Morbid Rankin Score: No symptoms Modified Rankin: Slight disability     Balance                                 Standardized Balance Assessment Standardized Balance Assessment : Berg Balance Test;Dynamic Gait Index Berg Balance Test Sit to Stand: Able to stand without using hands and stabilize independently Standing Unsupported: Able to stand safely 2 minutes Sitting with Back Unsupported but Feet Supported on Floor or Stool: Able to sit safely and securely 2 minutes Stand to Sit: Sits safely with minimal use of hands Transfers: Able to transfer safely, minor use of hands Standing Unsupported with Eyes Closed: Able to stand 10 seconds safely Standing Ubsupported with Feet Together: Able to place feet together independently and stand 1 minute safely From Standing, Reach Forward with Outstretched Arm: Can reach confidently >25 cm (10") From Standing Position, Pick up Object from Floor: Able to pick up shoe, needs supervision From Standing Position, Turn to Look Behind Over each Shoulder: Looks behind from both sides and weight shifts well Turn 360 Degrees: Able to turn 360 degrees safely but slowly Standing Unsupported, Alternately Place Feet on Step/Stool: Able to complete 4 steps without  aid or supervision Standing Unsupported, One Foot in Front: Able to plae foot ahead of the other independently and hold 30 seconds Standing on One Leg: Able to lift leg independently and hold equal to or more than 3 seconds Total Score: 48 Dynamic Gait Index Level Surface: Normal Change in Gait Speed: Mild Impairment Gait with Horizontal Head Turns: Normal Gait with  Vertical Head Turns: Normal Gait and Pivot Turn: Normal Step Over Obstacle: Mild Impairment Step Around Obstacles: Normal Steps: Mild Impairment Total Score: 21       Pertinent Vitals/Pain Pain Assessment: Faces Faces Pain Scale: Hurts a little bit Pain Location: general discomfort from being in bed. Pain Descriptors / Indicators: Discomfort Pain Intervention(s): Monitored during session;Limited activity within patient's tolerance    Home Living Family/patient expects to be discharged to:: Private residence Living Arrangements: Spouse/significant other Available Help at Discharge: Family Type of Home: House Home Access: Stairs to enter Entrance Stairs-Rails: Can reach both Entrance Stairs-Number of Steps: 3   Home Layout: Two level;Able to live on main level with bedroom/bathroom Home Equipment: Cane - single point;Rolling Walker (2 wheels);Shower seat      Prior Function Prior Level of Function : Independent/Modified Independent                     Hand Dominance   Dominant Hand: Left    Extremity/Trunk Assessment   Upper Extremity Assessment Upper Extremity Assessment: Defer to OT evaluation    Lower Extremity Assessment Lower Extremity Assessment: Overall WFL for tasks assessed       Communication   Communication: No difficulties  Cognition Arousal/Alertness: Awake/alert Behavior During Therapy: WFL for tasks assessed/performed Overall Cognitive Status: Within Functional Limits for tasks assessed                                          General Comments      Exercises     Assessment/Plan    PT Assessment Patient does not need any further PT services  PT Problem List         PT Treatment Interventions      PT Goals (Current goals can be found in the Care Plan section)  Acute Rehab PT Goals PT Goal Formulation: All assessment and education complete, DC therapy    Frequency     Barriers to discharge         Co-evaluation               AM-PAC PT "6 Clicks" Mobility  Outcome Measure Help needed turning from your back to your side while in a flat bed without using bedrails?: None Help needed moving from lying on your back to sitting on the side of a flat bed without using bedrails?: None Help needed moving to and from a bed to a chair (including a wheelchair)?: None Help needed standing up from a chair using your arms (e.g., wheelchair or bedside chair)?: None Help needed to walk in hospital room?: None Help needed climbing 3-5 steps with a railing? : None 6 Click Score: 24    End of Session   Activity Tolerance: Patient tolerated treatment well Patient left: Other (comment) (handoff to OT)   PT Visit Diagnosis: Other symptoms and signs involving the nervous system (U54.270)    Time: 6237-6283 PT Time Calculation (min) (ACUTE ONLY): 24 min   Charges:   PT Evaluation $PT  Eval Low Complexity: 1 Low PT Treatments $Physical Performance Test: 8-22 mins        Magda Kiel, PT Acute Rehabilitation Services [facility]:[phone] Office:[phone] 05/28/2021   Reginia Naas 05/28/2021, 9:51 AM

## 2021-05-28 NOTE — Evaluation (Signed)
Occupational Therapy Evaluation Patient Details Name: Erik Wiggins MRN: [id number] DOB: 11/22/1945 Today's Date: 05/28/2021   History of Present Illness Patient is a 75 y/o male admitted with sudden onset R visual deficits.  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  PMH positive for HTN, asthma, OSA, hyperlipidemia and morbid obesity.   Clinical Impression   Pt admitted for concerns listed above. PTA pt reported independence with all ADL's and IADL's including driving. At this time, pt presents with R side inferior nasal field cut, only apparent in the R eye. Pt continues to demonstrate independence with all  ADL's and functional mobility. OT advised pt to follow up with his doctor and eye doctor regarding driving, due to visual deficit. Otherwise at this time pt has no further OT needs and acute OT will sign off.      Recommendations for follow up therapy are one component of a multi-disciplinary discharge planning process, led by the attending physician.  Recommendations may be updated based on patient status, additional functional criteria and insurance authorization.   Follow Up Recommendations  No OT follow up    Assistance Recommended at Discharge None  Functional Status Assessment  Patient has not had a recent decline in their functional status  Equipment Recommendations  None recommended by OT    Recommendations for Other Services       Precautions / Restrictions Precautions Precautions: None Restrictions Weight Bearing Restrictions: No      Mobility Bed Mobility Overal bed mobility: Modified Independent                  Transfers Overall transfer level: Modified independent                 General transfer comment: No dificulties      Balance Overall balance assessment: Mild deficits observed, not formally tested                               Standardized Balance Assessment Standardized Balance Assessment : Berg Balance  Test;Dynamic Gait Index Berg Balance Test Sit to Stand: Able to stand without using hands and stabilize independently Standing Unsupported: Able to stand safely 2 minutes Sitting with Back Unsupported but Feet Supported on Floor or Stool: Able to sit safely and securely 2 minutes Stand to Sit: Sits safely with minimal use of hands Transfers: Able to transfer safely, minor use of hands Standing Unsupported with Eyes Closed: Able to stand 10 seconds safely Standing Ubsupported with Feet Together: Able to place feet together independently and stand 1 minute safely From Standing, Reach Forward with Outstretched Arm: Can reach confidently >25 cm (10") From Standing Position, Pick up Object from Floor: Able to pick up shoe, needs supervision From Standing Position, Turn to Look Behind Over each Shoulder: Looks behind from both sides and weight shifts well Turn 360 Degrees: Able to turn 360 degrees safely but slowly Standing Unsupported, Alternately Place Feet on Step/Stool: Able to complete 4 steps without aid or supervision Standing Unsupported, One Foot in Front: Able to plae foot ahead of the other independently and hold 30 seconds Standing on One Leg: Able to lift leg independently and hold equal to or more than 3 seconds Total Score: 48 Dynamic Gait Index Level Surface: Normal Change in Gait Speed: Mild Impairment Gait with Horizontal Head Turns: Normal Gait with Vertical Head Turns: Normal Gait and Pivot Turn: Normal Step Over Obstacle: Mild Impairment Step Around Obstacles: Normal  Steps: Mild Impairment Total Score: 21     ADL either performed or assessed with clinical judgement   ADL Overall ADL's : Modified independent;At baseline                                       General ADL Comments: Pt able to complete toileting, dressing, and simulate bathing with no difficulties.     Vision Baseline Vision/History: 1 Wears glasses Ability to See in Adequate Light: 0  Adequate Patient Visual Report: Central vision impairment Vision Assessment?: Yes Eye Alignment: Within Functional Limits Ocular Range of Motion: Within Functional Limits Alignment/Gaze Preference: Within Defined Limits Tracking/Visual Pursuits: Able to track stimulus in all quads without difficulty Saccades: Within functional limits Convergence: Within functional limits Visual Fields: Other (comment) Additional Comments: Pt's L eye appears WFL of all assessments, R eye, with L eye occluded appears to have an inferior nasal cut from his vision. With both eyes open, pt reports that he sees the whole picture with no deficit.     Perception     Praxis      Pertinent Vitals/Pain Pain Assessment: No/denies pain Faces Pain Scale: Hurts a little bit Pain Location: general discomfort from being in bed. Pain Descriptors / Indicators: Discomfort Pain Intervention(s): Monitored during session;Limited activity within patient's tolerance     Hand Dominance Left   Extremity/Trunk Assessment Upper Extremity Assessment Upper Extremity Assessment: Overall WFL for tasks assessed   Lower Extremity Assessment Lower Extremity Assessment: Defer to PT evaluation   Cervical / Trunk Assessment Cervical / Trunk Assessment: Normal   Communication Communication Communication: No difficulties   Cognition Arousal/Alertness: Awake/alert Behavior During Therapy: WFL for tasks assessed/performed Overall Cognitive Status: Within Functional Limits for tasks assessed                                       General Comments  VSS on RA    Exercises     Shoulder Instructions      Home Living Family/patient expects to be discharged to:: Private residence Living Arrangements: Spouse/significant other Available Help at Discharge: Family Type of Home: House Home Access: Stairs to enter Technical brewer of Steps: 3 Entrance Stairs-Rails: Can reach both Home Layout: Two level;Able  to live on main level with bedroom/bathroom     Bathroom Shower/Tub: Occupational psychologist: Handicapped height Bathroom Accessibility: Yes How Accessible: Accessible via walker Home Equipment: Festus - single point;Rolling Walker (2 wheels);Shower seat      Lives With: Spouse    Prior Functioning/Environment Prior Level of Function : Independent/Modified Independent             Mobility Comments: Walks with no assist ADLs Comments: Independent        OT Problem List: Decreased strength;Decreased range of motion;Decreased coordination;Impaired vision/perception      OT Treatment/Interventions:      OT Goals(Current goals can be found in the care plan section) Acute Rehab OT Goals Patient Stated Goal: To go home OT Goal Formulation: All assessment and education complete, DC therapy Time For Goal Achievement: 05/28/21 Potential to Achieve Goals: Good  OT Frequency:     Barriers to D/C:            Co-evaluation              AM-PAC  OT "6 Clicks" Daily Activity     Outcome Measure Help from another person eating meals?: None Help from another person taking care of personal grooming?: None Help from another person toileting, which includes using toliet, bedpan, or urinal?: None Help from another person bathing (including washing, rinsing, drying)?: None Help from another person to put on and taking off regular upper body clothing?: None Help from another person to put on and taking off regular lower body clothing?: None 6 Click Score: 24   End of Session Nurse Communication: Mobility status  Activity Tolerance: Patient tolerated treatment well Patient left: in bed;with call bell/phone within reach (W/ echo tech in room)  OT Visit Diagnosis: Other abnormalities of gait and mobility (R26.89);Muscle weakness (generalized) (M62.81);Unsteadiness on feet (R26.81)                Time: 7867-6720 OT Time Calculation (min): 10 min Charges:  OT General  Charges $OT Visit: 1 Visit OT Evaluation $OT Eval Low Complexity: [place]., OTR/L Acute Rehabilitation  Franchesca Veneziano Elane Tharon Kitch 05/28/2021, 10:09 AM

## 2021-05-28 NOTE — Plan of Care (Signed)
  Problem: Education: Goal: Knowledge of General Education information will improve Description: Including pain rating scale, medication(s)/side effects and non-pharmacologic comfort measures Outcome: Adequate for Discharge   

## 2021-05-28 NOTE — Discharge Summary (Signed)
Physician Discharge Summary  REVERE MAAHS ACZ:[id number] DOB: 09/18/1945 DOA: 05/27/2021  PCP: Deland Pretty, MD  Admit date: 05/27/2021 Discharge date: 05/28/2021  Admitted From: Home  Discharge disposition: Home   Recommendations for Outpatient Follow-Up:   Follow up with your primary care provider in one week.  Check CBC, BMP, magnesium in the next visit Follow-up with [facility] in 3 to 4 weeks.  Discharge Diagnosis:   Principal Problem:   TIA (transient ischemic attack) Active Problems:   OSA (obstructive sleep apnea)   Asthma, mild intermittent, well-controlled   HTN (hypertension)   Obesity, Class III, BMI 40-49.9 (morbid obesity) ([place])    Discharge Condition: Improved.  Diet recommendation: Low sodium, heart healthy.    Wound care: None.  Code status: Full.   History of Present Illness:   Erik Wiggins is a 75 y.o. male with past medical history of hypertension, asthma, obstructive sleep apnea, hyperlipidemia and morbid obesity presented to hospital with partial right IV visual loss.  He was evaluated by ophthalmology and was sent to the ED for concerns of retinal artery occlusion.  In the ED, patient had mild elevated blood pressure.  Patient was not a candidate for tPA since he was out of the window.  MRI of the brain and orbit was negative for acute findings.  CTA of the head and neck showed subtle intimal irregularity with small focal defects in the petrous right ICA.  ICA stenosis up to 50% on the right and 50 to 60% on the left.  Neurology was consulted and patient was admitted hospital for further evaluation.   Hospital Course:   Following conditions were addressed during hospitalization as listed below,  Acute partial right eye vision loss likely secondary to TIA. Was referred by ophthalmology for concerns of retinal artery occlusion.  Seen by neurology in the hospital.  No IV tPA was given.  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  CTA has been obtained which showed luminal irregularity with a small focal defect in the petrous right ICA and 50 to 60% stenosis of the ICA on the left..  2D echocardiogram with bubble study showed preserved LV function.  Negative bubble study.  Lipid panel showed triglyceride of 136, LDL of 51.  Hemoglobin A1c of 6.0.    Continue Crestor.  I spoke with neurology Dr Leonie Man and team prior to disposition who recommended aspirin and Plavix for 3 weeks followed by Plavix alone and outpatient follow-up with [facility].  Physical therapy and Occupational Therapy saw the patient and patient did not have any skilled therapy needs.   Essential Hypertension Not on any medications at home.  Will need closer monitoring as outpatient.  Asthma Not in exacerbation.  Continue inhalers.   OSA  CPAP at home.  Continue on discharge.   Morbid obesity Would benefit from continued weight loss as outpatient.  Disposition.  At this time, patient is stable for disposition home with outpatient PCP and neurology follow-up.  Medical Consultants:   Neurology  Procedures:    None Subjective:   Today, patient was seen and examined at bedside.  Still has mild visual deficit but denies any focal weakness, dizziness lightheadedness.  Discharge Exam:   Vitals:   05/28/21 0723 05/28/21 1118  BP: 129/78 (!) 157/66  Pulse: (!) 59 (!) 58  Resp: 14 14  Temp: (!) 97.5 F (36.4 C) 97.9 F (36.6 C)  SpO2: 96% 98%   Vitals:   05/28/21 0505  05/28/21 0643 05/28/21 0723 05/28/21 1118  BP: (!) 130/97 (!) 145/74 129/78 (!) 157/66  Pulse: 61 (!) 53 (!) 59 (!) 58  Resp:  20 14 14   Temp: 97.9 F (36.6 C) 97.8 F (36.6 C) (!) 97.5 F (36.4 C) 97.9 F (36.6 C)  TempSrc: Axillary Axillary Oral Oral  SpO2: 100% 96% 96% 98%  Weight:      Height:       General: Alert awake, not in obvious distress, morbidly obese HENT: pupils equally reacting to light,  No scleral pallor or  icterus noted. Oral mucosa is moist.  Chest:  Clear breath sounds.  Diminished breath sounds bilaterally. No crackles or wheezes.  CVS: S1 &S2 heard. No murmur.  Regular rate and rhythm. Abdomen: Soft, nontender, nondistended.  Bowel sounds are heard.   Extremities: No cyanosis, clubbing or edema.  Peripheral pulses are palpable. Psych: Alert, awake and oriented, normal mood CNS: Mild visual deficit on the right, power equal in all extremities.   Skin: Warm and dry.  No rashes noted.  The results of significant diagnostics from this hospitalization (including imaging, microbiology, ancillary and laboratory) are listed below for reference.     Diagnostic Studies:   CT Angio Head W or Wo Contrast  Result Date: 05/27/2021 CLINICAL DATA:  Initial evaluation for right-sided blurry vision. EXAM: CT ANGIOGRAPHY HEAD AND NECK TECHNIQUE: Multidetector CT imaging of the head and neck was performed using the standard protocol during bolus administration of intravenous contrast. Multiplanar CT image reconstructions and MIPs were obtained to evaluate the vascular anatomy. Carotid stenosis measurements (when applicable) are obtained utilizing NASCET criteria, using the distal internal carotid diameter as the denominator. CONTRAST:  67mL OMNIPAQUE IOHEXOL 350 MG/ML SOLN COMPARISON:  MRI from earlier the same day. FINDINGS: CT HEAD FINDINGS Brain: Mild age-related cerebral atrophy with chronic small vessel ischemic disease. No acute intracranial hemorrhage. No acute large vessel territory infarct. No mass lesion, mass effect or midline shift. No hydrocephalus or extra-axial fluid collection. Vascular: No hyperdense vessel. Skull: Scalp soft tissues and calvarium within normal limits. Sinuses: Moderate mucosal thickening noted within the ethmoidal air cells and partially visualized maxillary sinuses. No mastoid effusion. Orbits: Globes and orbital soft tissues demonstrate no acute finding. Review of the MIP images  confirms the above findings CTA NECK FINDINGS Aortic arch: Visualized aortic arch normal caliber with normal branch pattern. Mild atheromatous change about the arch and origin of the great vessels without hemodynamically significant stenosis. Right carotid system: Right CCA patent from its origin to the bifurcation without stenosis. Eccentric calcified plaque at the right carotid bulb/proximal right ICA with associated stenosis of up to 40-50% by NASCET criteria. Right ICA patent distally without stenosis, dissection or occlusion. Left carotid system: Left CCA patent from its origin to the bifurcation without stenosis. Eccentric calcified plaque at the left carotid bulb/proximal left ICA with associated stenosis of up to 50-60% by NASCET criteria. Left ICA patent distally without stenosis, dissection or occlusion. Vertebral arteries: Both vertebral arteries arise from the subclavian arteries. No proximal subclavian artery stenosis. Both vertebral arteries widely patent without stenosis, dissection or occlusion. Skeleton: No discrete or worrisome osseous lesions. Moderate multilevel spondylosis throughout the visualized spine. Other neck: Unremarkable. Upper chest: Unremarkable. Review of the MIP images confirms the above findings CTA HEAD FINDINGS Anterior circulation: On the right, there is subtle intimal irregularity with possible small focal filling defect involving the horizontal petrous right ICA (series 14, images 94, 96). Given the patient's history of blurry right  vision, findings suspicious for a possible small focal dissection. A possible small ruptured plaque with intraluminal thrombus could also be considered. No frank raised dissection flap or significant stenosis. Right ICA otherwise patent distally to the terminus. Contralateral horizontal petrous left ICA patent. Mild atheromatous change within the left carotid siphon without significant stenosis. A1 segments patent bilaterally. Normal anterior  communicating artery complex. Anterior cerebral arteries patent to their distal aspects without stenosis. No M1 stenosis or occlusion. Normal MCA bifurcations. Distal MCA branches perfused and symmetric. Posterior circulation: Both V4 segments patent to the vertebrobasilar junction without stenosis. Left vertebral artery dominant. Both PICA origins patent and normal. Basilar patent to its distal aspect without stenosis. Superior cerebellar arteries patent bilaterally. Both PCAs primarily supplied via the basilar and are well perfused to their distal aspects. Venous sinuses: Patent allowing for timing the contrast. Anatomic variants: None significant. Review of the MIP images confirms the above findings IMPRESSION: 1. Subtle intimal irregularity with small focal filling defects involving the horizontal petrous right ICA. Given the patient's history of right-sided blurry vision, finding is suspicious for a possible small focal dissection. A possible small ruptured plaque with intraluminal thrombus could also be considered. No frank raised dissection flap or significant stenosis. 2. Atheromatous plaque about the carotid bifurcations/proximal ICAs with associated stenoses of up to 40-50% on the right and 50-60% on the left. 3. Otherwise wide patency of the major arterial vasculature of the head and neck. No other hemodynamically significant or correctable stenosis. 4. No other acute intracranial abnormality. Electronically Signed   By: Jeannine Boga M.D.   On: 05/27/2021 22:32   CT Angio Neck W and/or Wo Contrast  Result Date: 05/27/2021 CLINICAL DATA:  Initial evaluation for right-sided blurry vision. EXAM: CT ANGIOGRAPHY HEAD AND NECK TECHNIQUE: Multidetector CT imaging of the head and neck was performed using the standard protocol during bolus administration of intravenous contrast. Multiplanar CT image reconstructions and MIPs were obtained to evaluate the vascular anatomy. Carotid stenosis measurements  (when applicable) are obtained utilizing NASCET criteria, using the distal internal carotid diameter as the denominator. CONTRAST:  23mL OMNIPAQUE IOHEXOL 350 MG/ML SOLN COMPARISON:  MRI from earlier the same day. FINDINGS: CT HEAD FINDINGS Brain: Mild age-related cerebral atrophy with chronic small vessel ischemic disease. No acute intracranial hemorrhage. No acute large vessel territory infarct. No mass lesion, mass effect or midline shift. No hydrocephalus or extra-axial fluid collection. Vascular: No hyperdense vessel. Skull: Scalp soft tissues and calvarium within normal limits. Sinuses: Moderate mucosal thickening noted within the ethmoidal air cells and partially visualized maxillary sinuses. No mastoid effusion. Orbits: Globes and orbital soft tissues demonstrate no acute finding. Review of the MIP images confirms the above findings CTA NECK FINDINGS Aortic arch: Visualized aortic arch normal caliber with normal branch pattern. Mild atheromatous change about the arch and origin of the great vessels without hemodynamically significant stenosis. Right carotid system: Right CCA patent from its origin to the bifurcation without stenosis. Eccentric calcified plaque at the right carotid bulb/proximal right ICA with associated stenosis of up to 40-50% by NASCET criteria. Right ICA patent distally without stenosis, dissection or occlusion. Left carotid system: Left CCA patent from its origin to the bifurcation without stenosis. Eccentric calcified plaque at the left carotid bulb/proximal left ICA with associated stenosis of up to 50-60% by NASCET criteria. Left ICA patent distally without stenosis, dissection or occlusion. Vertebral arteries: Both vertebral arteries arise from the subclavian arteries. No proximal subclavian artery stenosis. Both vertebral arteries widely patent without  stenosis, dissection or occlusion. Skeleton: No discrete or worrisome osseous lesions. Moderate multilevel spondylosis throughout  the visualized spine. Other neck: Unremarkable. Upper chest: Unremarkable. Review of the MIP images confirms the above findings CTA HEAD FINDINGS Anterior circulation: On the right, there is subtle intimal irregularity with possible small focal filling defect involving the horizontal petrous right ICA (series 14, images 94, 96). Given the patient's history of blurry right vision, findings suspicious for a possible small focal dissection. A possible small ruptured plaque with intraluminal thrombus could also be considered. No frank raised dissection flap or significant stenosis. Right ICA otherwise patent distally to the terminus. Contralateral horizontal petrous left ICA patent. Mild atheromatous change within the left carotid siphon without significant stenosis. A1 segments patent bilaterally. Normal anterior communicating artery complex. Anterior cerebral arteries patent to their distal aspects without stenosis. No M1 stenosis or occlusion. Normal MCA bifurcations. Distal MCA branches perfused and symmetric. Posterior circulation: Both V4 segments patent to the vertebrobasilar junction without stenosis. Left vertebral artery dominant. Both PICA origins patent and normal. Basilar patent to its distal aspect without stenosis. Superior cerebellar arteries patent bilaterally. Both PCAs primarily supplied via the basilar and are well perfused to their distal aspects. Venous sinuses: Patent allowing for timing the contrast. Anatomic variants: None significant. Review of the MIP images confirms the above findings IMPRESSION: 1. Subtle intimal irregularity with small focal filling defects involving the horizontal petrous right ICA. Given the patient's history of right-sided blurry vision, finding is suspicious for a possible small focal dissection. A possible small ruptured plaque with intraluminal thrombus could also be considered. No frank raised dissection flap or significant stenosis. 2. Atheromatous plaque about the  carotid bifurcations/proximal ICAs with associated stenoses of up to 40-50% on the right and 50-60% on the left. 3. Otherwise wide patency of the major arterial vasculature of the head and neck. No other hemodynamically significant or correctable stenosis. 4. No other acute intracranial abnormality. Electronically Signed   By: Jeannine Boga M.D.   On: 05/27/2021 22:32   MR BRAIN WO CONTRAST  Result Date: 05/27/2021 CLINICAL DATA:  Initial evaluation for right-sided blurry vision. EXAM: MRI HEAD AND ORBITS WITHOUT CONTRAST TECHNIQUE: Multiplanar, multi-echo pulse sequences of the brain and surrounding structures were acquired without intravenous contrast. Multiplanar, multi-echo pulse sequences of the orbits and surrounding structures were acquired including fat saturation techniques, without intravenous contrast administration. COMPARISON:  None. FINDINGS: MRI HEAD FINDINGS Brain: Examination degraded by motion artifact. Generalized age-related cerebral atrophy. Mild scattered T2/FLAIR hyperintensity noted involving the periventricular white matter, most like related chronic microvascular ischemic disease, overall fairly minimal for age. No abnormal foci of restricted diffusion to suggest acute ischemia. No areas of chronic cortical infarction. No evidence for acute or chronic intracranial hemorrhage. No mass lesion or midline shift. No hydrocephalus or extra-axial fluid collection. Pituitary gland and suprasellar region normal. Midline structures intact. Vascular: Major intracranial vascular flow voids are maintained. Skull and upper cervical spine: Craniocervical junction normal. Bone marrow signal intensity normal. No scalp soft tissue abnormality. Other: Trace left mastoid effusion noted, of doubtful significance. MRI ORBITS FINDINGS Orbits: Globes are symmetric in size with normal morphology. Patient status post bilateral ocular lens replacement. Optic nerves symmetric with normal noncontrast  appearance. Extra-ocular muscles symmetric and within normal limits. Intraconal and extraconal fat well-maintained. Lacrimal glands normal. No abnormality about the orbital apices or cavernous sinus. Superior orbital veins symmetric and within normal limits. Optic chiasm normally situated within the suprasellar cistern. Visualized optic radiations normal. Visualized  sinuses: Moderate mucosal thickening seen throughout the ethmoidal air cells and maxillary sinuses. Soft tissues: Unremarkable. IMPRESSION: 1. Negative MRI of the brain and orbits. No acute intracranial abnormality or findings to explain patient's symptoms. 2. Mild age-related cerebral atrophy with chronic small vessel ischemic disease. 3. Moderate ethmoidal and maxillary sinusitis. Electronically Signed   By: Jeannine Boga M.D.   On: 05/27/2021 21:01   ECHOCARDIOGRAM COMPLETE BUBBLE STUDY  Result Date: 05/28/2021    ECHOCARDIOGRAM REPORT   Patient Name:   Erik Wiggins Date of Exam: 05/28/2021 Medical Rec #:  [id number]        Height:       70.0 in Accession #:    [id number]       Weight:       318.0 lb Date of Birth:  03-28-1946        BSA:          2.542 m Patient Age:    74 years         BP:           129/78 mmHg Patient Gender: M                HR:           54 bpm. Exam Location:  Inpatient Procedure: 2D Echo, Cardiac Doppler, Color Doppler and Saline Contrast Bubble            Study Indications:    Stroke 434.91 / I63.9  History:        Patient has no prior history of Echocardiogram examinations.                 Risk Factors:Dyslipidemia and Hypertension.  Sonographer:    Bernadene Person RDCS Referring Phys: [id number] [place]  1. Left ventricular ejection fraction, by estimation, is 60 to 65%. The left ventricle has normal function. The left ventricle has no regional wall motion abnormalities. There is mild left ventricular hypertrophy. Left ventricular diastolic parameters are consistent with Grade I diastolic dysfunction  (impaired relaxation).  2. Right ventricular systolic function is normal. The right ventricular size is normal. Tricuspid regurgitation signal is inadequate for assessing PA pressure.  3. The mitral valve is normal in structure. Trivial mitral valve regurgitation. No evidence of mitral stenosis.  4. The aortic valve is tricuspid. Aortic valve regurgitation is not visualized. Mild aortic valve sclerosis is present, with no evidence of aortic valve stenosis.  5. Aortic dilatation noted. There is mild dilatation of the ascending aorta, measuring 44 mm.  6. The inferior vena cava is normal in size with greater than 50% respiratory variability, suggesting right atrial pressure of 3 mmHg.  7. Negative bubble study, no evidence for ASD/PFO. FINDINGS  Left Ventricle: Left ventricular ejection fraction, by estimation, is 60 to 65%. The left ventricle has normal function. The left ventricle has no regional wall motion abnormalities. The left ventricular internal cavity size was normal in size. There is  mild left ventricular hypertrophy. Left ventricular diastolic parameters are consistent with Grade I diastolic dysfunction (impaired relaxation). Right Ventricle: The right ventricular size is normal. No increase in right ventricular wall thickness. Right ventricular systolic function is normal. Tricuspid regurgitation signal is inadequate for assessing PA pressure. Left Atrium: Left atrial size was normal in size. Right Atrium: Right atrial size was normal in size. Pericardium: There is no evidence of pericardial effusion. Mitral Valve: The mitral valve is normal in structure. Trivial mitral valve regurgitation. No evidence of mitral  valve stenosis. Tricuspid Valve: The tricuspid valve is normal in structure. Tricuspid valve regurgitation is not demonstrated. Aortic Valve: The aortic valve is tricuspid. Aortic valve regurgitation is not visualized. Mild aortic valve sclerosis is present, with no evidence of aortic valve  stenosis. Pulmonic Valve: The pulmonic valve was normal in structure. Pulmonic valve regurgitation is not visualized. Aorta: Aortic dilatation noted. There is mild dilatation of the ascending aorta, measuring 44 mm. Venous: The inferior vena cava is normal in size with greater than 50% respiratory variability, suggesting right atrial pressure of 3 mmHg. IAS/Shunts: Negative bubble study, no evidence for ASD/PFO. Agitated saline contrast was given intravenously to evaluate for intracardiac shunting.  LEFT VENTRICLE PLAX 2D LVIDd:         5.00 cm   Diastology LVIDs:         3.30 cm   LV e' medial:    6.68 cm/s LV PW:         1.00 cm   LV E/e' medial:  12.4 LV IVS:        1.30 cm   LV e' lateral:   7.95 cm/s LVOT diam:     2.20 cm   LV E/e' lateral: 10.5 LV SV:         86 LV SV Index:   34 LVOT Area:     3.80 cm  RIGHT VENTRICLE RV S prime:     14.70 cm/s TAPSE (M-mode): 1.8 cm LEFT ATRIUM             Index        RIGHT ATRIUM           Index LA diam:        3.60 cm 1.42 cm/m   RA Area:     20.10 cm LA Vol (A2C):   73.4 ml 28.87 ml/m  RA Volume:   49.10 ml  19.32 ml/m LA Vol (A4C):   67.0 ml 26.36 ml/m LA Biplane Vol: 72.8 ml 28.64 ml/m  AORTIC VALVE LVOT Vmax:   94.60 cm/s LVOT Vmean:  65.200 cm/s LVOT VTI:    0.227 m  AORTA Ao Root diam: 3.80 cm Ao Asc diam:  4.40 cm MITRAL VALVE MV Area (PHT): 2.75 cm    SHUNTS MV Decel Time: 276 msec    Systemic VTI:  0.23 m MV E velocity: 83.10 cm/s  Systemic Diam: 2.20 cm MV A velocity: 37.70 cm/s MV E/A ratio:  2.20 Dalton McleanMD Electronically signed by Franki Monte Signature Date/Time: 05/28/2021/3:38:44 PM    Final    MR ORBITS WO CONTRAST  Result Date: 05/27/2021 CLINICAL DATA:  Initial evaluation for right-sided blurry vision. EXAM: MRI HEAD AND ORBITS WITHOUT CONTRAST TECHNIQUE: Multiplanar, multi-echo pulse sequences of the brain and surrounding structures were acquired without intravenous contrast. Multiplanar, multi-echo pulse sequences of the orbits and  surrounding structures were acquired including fat saturation techniques, without intravenous contrast administration. COMPARISON:  None. FINDINGS: MRI HEAD FINDINGS Brain: Examination degraded by motion artifact. Generalized age-related cerebral atrophy. Mild scattered T2/FLAIR hyperintensity noted involving the periventricular white matter, most like related chronic microvascular ischemic disease, overall fairly minimal for age. No abnormal foci of restricted diffusion to suggest acute ischemia. No areas of chronic cortical infarction. No evidence for acute or chronic intracranial hemorrhage. No mass lesion or midline shift. No hydrocephalus or extra-axial fluid collection. Pituitary gland and suprasellar region normal. Midline structures intact. Vascular: Major intracranial vascular flow voids are maintained. Skull and upper cervical spine: Craniocervical junction normal. Bone  marrow signal intensity normal. No scalp soft tissue abnormality. Other: Trace left mastoid effusion noted, of doubtful significance. MRI ORBITS FINDINGS Orbits: Globes are symmetric in size with normal morphology. Patient status post bilateral ocular lens replacement. Optic nerves symmetric with normal noncontrast appearance. Extra-ocular muscles symmetric and within normal limits. Intraconal and extraconal fat well-maintained. Lacrimal glands normal. No abnormality about the orbital apices or cavernous sinus. Superior orbital veins symmetric and within normal limits. Optic chiasm normally situated within the suprasellar cistern. Visualized optic radiations normal. Visualized sinuses: Moderate mucosal thickening seen throughout the ethmoidal air cells and maxillary sinuses. Soft tissues: Unremarkable. IMPRESSION: 1. Negative MRI of the brain and orbits. No acute intracranial abnormality or findings to explain patient's symptoms. 2. Mild age-related cerebral atrophy with chronic small vessel ischemic disease. 3. Moderate ethmoidal and  maxillary sinusitis. Electronically Signed   By: Jeannine Boga M.D.   On: 05/27/2021 21:01     Labs:   Basic Metabolic Panel: Recent Labs  Lab 05/27/21 1743 05/27/21 1812  NA 139 142  K 3.6 3.6  CL 106 104  CO2 25  --   GLUCOSE 168* 167*  BUN 13 12  CREATININE 0.90 0.90  CALCIUM 9.3  --    GFR Estimated Creatinine Clearance: 101.8 mL/min (by C-G formula based on SCr of 0.9 mg/dL). Liver Function Tests: Recent Labs  Lab 05/27/21 1743  AST 21  ALT 20  ALKPHOS 65  BILITOT 0.8  PROT 7.4  ALBUMIN 4.1   No results for input(s): LIPASE, AMYLASE in the last 168 hours. No results for input(s): AMMONIA in the last 168 hours. Coagulation profile Recent Labs  Lab 05/27/21 1743  INR 1.0    CBC: Recent Labs  Lab 05/27/21 1743 05/27/21 1812  WBC 7.2  --   NEUTROABS 5.0  --   HGB 12.8* 12.9*  HCT 39.3 38.0*  MCV 89.5  --   PLT 122*  --    Cardiac Enzymes: No results for input(s): CKTOTAL, CKMB, CKMBINDEX, TROPONINI in the last 168 hours. BNP: Invalid input(s): POCBNP CBG: No results for input(s): GLUCAP in the last 168 hours. D-Dimer No results for input(s): DDIMER in the last 72 hours. Hgb A1c Recent Labs    05/27/21 1751  HGBA1C 6.0*   Lipid Profile Recent Labs    05/28/21 0334  CHOL 126  HDL 36*  LDLCALC 51  TRIG 193*  CHOLHDL 3.5   Thyroid function studies No results for input(s): TSH, T4TOTAL, T3FREE, THYROIDAB in the last 72 hours.  Invalid input(s): FREET3 Anemia work up No results for input(s): VITAMINB12, FOLATE, FERRITIN, TIBC, IRON, RETICCTPCT in the last 72 hours. Microbiology Recent Results (from the past 240 hour(s))  Resp Panel by RT-PCR (Flu A&B, Covid) Nasopharyngeal Swab     Status: None   Collection Time: 05/27/21  6:07 PM   Specimen: Nasopharyngeal Swab; Nasopharyngeal(NP) swabs in vial transport medium  Result Value Ref Range Status   SARS Coronavirus 2 by RT PCR NEGATIVE NEGATIVE Final    Comment:  (NOTE) SARS-CoV-2 target nucleic acids are NOT DETECTED.  The SARS-CoV-2 RNA is generally detectable in upper respiratory specimens during the acute phase of infection. The lowest concentration of SARS-CoV-2 viral copies this assay can detect is 138 copies/mL. A negative result does not preclude SARS-Cov-2 infection and should not be used as the sole basis for treatment or other patient management decisions. A negative result may occur with  improper specimen collection/handling, submission of specimen other than nasopharyngeal swab, presence of  viral mutation(s) within the areas targeted by this assay, and inadequate number of viral copies(<138 copies/mL). A negative result must be combined with clinical observations, patient history, and epidemiological information. The expected result is Negative.  Fact Sheet for Patients:  EntrepreneurPulse.com.au  Fact Sheet for Healthcare Providers:  IncredibleEmployment.be  This test is no t yet approved or cleared by the [place] FDA and  has been authorized for detection and/or diagnosis of SARS-CoV-2 by FDA under an Emergency Use Authorization (EUA). This EUA will remain  in effect (meaning this test can be used) for the duration of the COVID-19 declaration under Section 564(b)(1) of the Act, 21 U.S.C.section 360bbb-3(b)(1), unless the authorization is terminated  or revoked sooner.       Influenza A by PCR NEGATIVE NEGATIVE Final   Influenza B by PCR NEGATIVE NEGATIVE Final    Comment: (NOTE) The Xpert Xpress SARS-CoV-2/FLU/RSV plus assay is intended as an aid in the diagnosis of influenza from Nasopharyngeal swab specimens and should not be used as a sole basis for treatment. Nasal washings and aspirates are unacceptable for Xpert Xpress SARS-CoV-2/FLU/RSV testing.  Fact Sheet for Patients: EntrepreneurPulse.com.au  Fact Sheet for Healthcare  Providers: IncredibleEmployment.be  This test is not yet approved or cleared by the [place] FDA and has been authorized for detection and/or diagnosis of SARS-CoV-2 by FDA under an Emergency Use Authorization (EUA). This EUA will remain in effect (meaning this test can be used) for the duration of the COVID-19 declaration under Section 564(b)(1) of the Act, 21 U.S.C. section 360bbb-3(b)(1), unless the authorization is terminated or revoked.  Performed at [facility], [place] [address], [place], [place] [address]      Discharge Instructions:   Discharge Instructions     Ambulatory referral to Neurology   Complete by: As directed    An appointment is requested in approximately: 4 weeks   Diet - low sodium heart healthy   Complete by: As directed    Discharge instructions   Complete by: As directed    Follow-up with your primary care provider in 1 week.  Follow-up with [facility] in 4 weeks.   Continue aspirin from home for the next 3 weeks only.   Increase activity slowly   Complete by: As directed       Allergies as of 05/28/2021       Reactions   Lisinopril Swelling, Other (See Comments)   Swollen lips   Sulfa Antibiotics Hives   Sulfacetamide Sodium Other (See Comments)        Medication List     STOP taking these medications    erythromycin ophthalmic ointment   naproxen sodium 220 MG tablet Commonly known as: ALEVE       TAKE these medications    albuterol 108 (90 Base) MCG/ACT inhaler Commonly known as: ProAir HFA Inhale 4 puffs into the lungs every 4 (four) hours as needed for wheezing or shortness of breath. 2 puffs every 6 hrs as needed What changed: additional instructions   aspirin 81 MG chewable tablet Chew 1 tablet (81 mg total) by mouth daily for 21 days.   azelastine 0.1 % nasal spray Commonly known as: Astelin Place 1 spray into both nostrils 2 (two) times daily. Use in each  nostril as directed What changed:  when to take this additional instructions   cetirizine 10 MG tablet Commonly known as: ZYRTEC Take 10 mg by mouth daily.   clopidogrel 75 MG tablet Commonly known as: PLAVIX  Take 1 tablet (75 mg total) by mouth daily.   indapamide 2.5 MG tablet Commonly known as: LOZOL Take 2.5 mg by mouth daily.   ipratropium 0.03 % nasal spray Commonly known as: ATROVENT Place 1 spray into both nostrils daily as needed for rhinitis.   montelukast 10 MG tablet Commonly known as: SINGULAIR Take 10 mg by mouth at bedtime.   multivitamin with minerals Tabs tablet Take 1 tablet by mouth daily. Men's  One A Day over 50   NASACORT ALLERGY 24HR NA Place 1 spray into the nose daily as needed (congestion).   Polyethyl Glycol-Propyl Glycol 0.4-0.3 % Soln Place 2 drops into both eyes daily as needed (for dry eyes).   rosuvastatin 20 MG tablet Commonly known as: CRESTOR Take 1 tablet (20 mg total) by mouth daily.   sertraline 50 MG tablet Commonly known as: ZOLOFT Take 50 mg by mouth daily.   tobramycin 0.3 % ophthalmic solution Commonly known as: TOBREX Place 1 drop into the left eye 4 (four) times daily.   TURMERIC CURCUMIN PO Take 1,500 mg by mouth daily.   Vitamin D 50 MCG (2000 UT) Caps Take 2,000 Units by mouth 2 (two) times daily.   Xhance 93 MCG/ACT Exhu Generic drug: Fluticasone Propionate Place 2 sprays into both nostrils 2 (two) times daily.        Follow-up Information     Deland Pretty, MD Follow up in 1 week(s).   Specialty: Internal Medicine Contact information: [address] [place] [address] [phone]         Nahser, Wonda Cheng, MD .   Specialty: Cardiology Contact information: [place] [address] [phone]                 Time coordinating discharge: 39 minutes  Signed:  Jennae Hakeem  [facility] 05/28/2021, 3:48 PM

## 2021-05-30 ENCOUNTER — Encounter: Payer: Self-pay | Admitting: Cardiovascular Disease

## 2021-05-30 ENCOUNTER — Ambulatory Visit (INDEPENDENT_AMBULATORY_CARE_PROVIDER_SITE_OTHER): Payer: Medicare Other | Admitting: Cardiovascular Disease

## 2021-05-30 ENCOUNTER — Other Ambulatory Visit: Payer: Self-pay

## 2021-05-30 ENCOUNTER — Ambulatory Visit (INDEPENDENT_AMBULATORY_CARE_PROVIDER_SITE_OTHER): Payer: Medicare Other

## 2021-05-30 VITALS — BP 134/88 | HR 67 | Ht 70.0 in | Wt 315.0 lb

## 2021-05-30 DIAGNOSIS — I10 Essential (primary) hypertension: Secondary | ICD-10-CM | POA: Diagnosis not present

## 2021-05-30 DIAGNOSIS — I2510 Atherosclerotic heart disease of native coronary artery without angina pectoris: Secondary | ICD-10-CM

## 2021-05-30 DIAGNOSIS — Z6841 Body Mass Index (BMI) 40.0 and over, adult: Secondary | ICD-10-CM

## 2021-05-30 DIAGNOSIS — G4733 Obstructive sleep apnea (adult) (pediatric): Secondary | ICD-10-CM | POA: Diagnosis not present

## 2021-05-30 DIAGNOSIS — G459 Transient cerebral ischemic attack, unspecified: Secondary | ICD-10-CM | POA: Diagnosis not present

## 2021-05-30 DIAGNOSIS — E785 Hyperlipidemia, unspecified: Secondary | ICD-10-CM

## 2021-05-30 DIAGNOSIS — I2584 Coronary atherosclerosis due to calcified coronary lesion: Secondary | ICD-10-CM | POA: Diagnosis not present

## 2021-05-30 NOTE — Patient Instructions (Signed)
Medication Instructions:  Your physician recommends that you continue on your current medications as directed. Please refer to the Current Medication list given to you today.  *If you need a refill on your cardiac medications before your next appointment, please call your pharmacy*   Lab Work: None ordered.  If you have labs (blood work) drawn today and your tests are completely normal, you will receive your results only by: MyChart Message (if you have MyChart) OR A paper copy in the mail If you have any lab test that is abnormal or we need to change your treatment, we will call you to review the results.   Testing/Procedures: ZIO XT- Long Term Monitor Instructions  Your physician has requested you wear a ZIO patch monitor for 14 days.  This is a single patch monitor. [employer] supplies one patch monitor per enrollment. Additional stickers are not available. Please do not apply patch if you will be having a Nuclear Stress Test,  Echocardiogram, Cardiac CT, MRI, or Chest Xray during the period you would be wearing the  monitor. The patch cannot be worn during these tests. You cannot remove and re-apply the  ZIO XT patch monitor.  Your ZIO patch monitor will be mailed 3 day USPS to your address on file. It may take 3-5 days  to receive your monitor after you have been enrolled.  Once you have received your monitor, please review the enclosed instructions. Your monitor  has already been registered assigning a specific monitor serial # to you.  Billing and Patient Assistance Program Information  We have supplied [employer] with any of your insurance information on file for billing purposes. [employer] offers a sliding scale Patient Assistance Program for patients that do not have  insurance, or whose insurance does not completely cover the cost of the ZIO monitor.  You must apply for the Patient Assistance Program to qualify for this discounted rate.  To apply, please call [employer] at  [phone], select option 4, select option 2, ask to apply for  Patient Assistance Program. [employer] will ask your household income, and how many people  are in your household. They will quote your out-of-pocket cost based on that information.  [employer] will also be able to set up a 12-month, interest-free payment plan if needed.  Applying the monitor   Shave hair from upper left chest.  Hold abrader disc by orange tab. Rub abrader in 40 strokes over the upper left chest as  indicated in your monitor instructions.  Clean area with 4 enclosed alcohol pads. Let dry.  Apply patch as indicated in monitor instructions. Patch will be placed under collarbone on left  side of chest with arrow pointing upward.  Rub patch adhesive wings for 2 minutes. Remove white label marked "1". Remove the white  label marked "2". Rub patch adhesive wings for 2 additional minutes.  While looking in a mirror, press and release button in center of patch. A small green light will  flash 3-4 times. This will be your only indicator that the monitor has been turned on.  Do not shower for the first 24 hours. You may shower after the first 24 hours.  Press the button if you feel a symptom. You will hear a small click. Record Date, Time and  Symptom in the Patient Logbook.  When you are ready to remove the patch, follow instructions on the last 2 pages of Patient  Logbook. Stick patch monitor onto the last page of Patient Logbook.  Place Patient   Logbook in the blue and white box. Use locking tab on box and tape box closed  securely. The blue and white box has prepaid postage on it. Please place it in the mailbox as  soon as possible. Your physician should have your test results approximately 7 days after the  monitor has been mailed back to [facility].  Call [facility] at [phone] if you have questions regarding  your ZIO XT patch monitor. Call them immediately if you see an orange light  blinking on your  monitor.  If your monitor falls off in less than 4 days, contact our Monitor department at [phone].  If your monitor becomes loose or falls off after 4 days call [employer] at [phone] for  suggestions on securing your monitor    Follow-Up: At [facility], you and your health needs are our priority.  As part of our continuing mission to provide you with exceptional heart care, we have created designated Provider Care Teams.  These Care Teams include your primary Cardiologist (physician) and Advanced Practice Providers (APPs -  Physician Assistants and Nurse Practitioners) who all work together to provide you with the care you need, when you need it.  We recommend signing up for the patient portal called "MyChart".  Sign up information is provided on this After Visit Summary.  MyChart is used to connect with patients for Virtual Visits (Telemedicine).  Patients are able to view lab/test results, encounter notes, upcoming appointments, etc.  Non-urgent messages can be sent to your provider as well.   To learn more about what you can do with MyChart, go to [email or web address].    Your next appointment:   You will see an APP in 6 months.  We will send you a reminder to schedule

## 2021-05-30 NOTE — Progress Notes (Addendum)
Cardiology Office Note:    Date:  05/30/2021   ID:  Erik Wiggins, DOB 15-Apr-1946, MRN [id number]  PCP:  Deland Pretty, MD   [facility] Cardiologist:  previously Ena Dawley, MD   - Mertie Moores, MD {    Problem List  1.  Morbid obesity 2.  Hyperlipidemia 3.  Obstructive sleep apnea 4.  Hypertension 5.  TIA  Referring MD: Deland Pretty, MD   Chief Complaint  Patient presents with   Transient Ischemic Attack    History of Present Illness:    Erik Wiggins is a 75 y.o. male with a hx of coronary artery calcifications, hypertension, hyperlipidemia, morbid obesity.  He was recently hospitalized with a TIA.  He had a echocardiogram with a negative bubble study.  He was referred to the hospital by ophthalmology for concerns of a retinal artery occlusion.  He was seen by neurology.  MRI of the brain revealed chronic small vessel ischemic disease.  CTA of the brain revealed minor luminal irregularities in the right ICA and 50 to 60% stenosis of the left ICA.  The recommendation was for Plavix and aspirin for 3 weeks followed by Plavix alone and follow-up with [facility].  Addendum:  I have discussed with Dr. Leonie Man.   Patient does not need a TEE or implantable loop recorder.   Cont with 14 day event monitor.  Echocardiogram performed on May 28, 2021 reveals normal left ventricular systolic function.  He has mild left ventricular hypertrophy.  He has grade 1 diastolic dysfunction.  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  Lipids are managed by Dr. Shelia Media Lipid levels from May 28, 2021 reveals a total cholesterol of 126.  His HDL is 36.  LDL is 51.  The triglyceride level is mildly elevated at 193.  Has enrolled with [facility] Weight control   CAC score: at [facility]   IMPRESSION: The total coronary calcium score is 553 which is between the 50th and 75th percentile for a male patient this age.    No residual deficits      Past Medical History:  Diagnosis Date   Allergic rhinitis    Androgen deficiency    Asthma    Asthma, mild intermittent, well-controlled 12/22/2014   Erectile dysfunction    High cholesterol    Hyperlipemia    Hypertension    Low testosterone    Major depressive disorder, recurrent episode, in partial remission ([place])    Metatarsalgia    Moderate persistent asthma, uncomplicated 66/12/3014   Nasal polyps 06/21/2014   Demonstrated on previous sinus CT by report. No history of aspirin intolerance    Obesity 06/21/2014   Onychomycosis    OSA (obstructive sleep apnea)    OSA, uses CPAP nightly   Perennial allergic rhinitis 06/21/2014   Allergy vaccine started 2012 in [place]    Umbilical hernia    Vitamin D deficiency     Past Surgical History:  Procedure Laterality Date   CARDIAC CATHETERIZATION     COLONSCOPY     EYE SURGERY     cataracts   INSERTION OF MESH N/A 02/11/2018   Procedure: INSERTION OF MESH;  Surgeon: Armandina Gemma, MD;  Location: [place];  Service: General;  Laterality: N/A;   UMBILICAL HERNIA REPAIR N/A 02/11/2018   Procedure: OPEN REPAIR UMBILICAL HERNIA;  Surgeon: Armandina Gemma, MD;  Location: [place];  Service: General;  Laterality: N/A;    Current Medications: Current Meds  Medication Sig   albuterol (PROAIR HFA) 108 (90 Base) MCG/ACT inhaler Inhale 4 puffs into the lungs every 4 (four) hours as needed for wheezing or shortness of breath. 2 puffs every 6 hrs as needed   aspirin 81 MG chewable tablet Chew 1 tablet (81 mg total) by mouth daily for 21 days.   azelastine (ASTELIN) 0.1 % nasal spray Place 1 spray into both nostrils 2 (two) times daily. Use in each nostril as directed   cetirizine (ZYRTEC) 10 MG tablet Take 10 mg by mouth daily.   Cholecalciferol (VITAMIN D) 2000 units CAPS Take 2,000 Units by mouth 2 (two) times daily.   clopidogrel (PLAVIX) 75 MG tablet Take 1 tablet (75 mg total) by mouth daily.    indapamide (LOZOL) 2.5 MG tablet Take 2.5 mg by mouth daily.    ipratropium (ATROVENT) 0.03 % nasal spray Place 1 spray into both nostrils daily as needed for rhinitis.   montelukast (SINGULAIR) 10 MG tablet Take 10 mg by mouth at bedtime.    Multiple Vitamin (MULTIVITAMIN WITH MINERALS) TABS tablet Take 1 tablet by mouth daily. Men's  One A Day over 50   Polyethyl Glycol-Propyl Glycol 0.4-0.3 % SOLN Place 2 drops into both eyes daily as needed (for dry eyes).   rosuvastatin (CRESTOR) 20 MG tablet Take 1 tablet (20 mg total) by mouth daily.   sertraline (ZOLOFT) 50 MG tablet Take 50 mg by mouth daily.    Triamcinolone Acetonide (NASACORT ALLERGY 24HR NA) Place 1 spray into the nose daily as needed (congestion).   TURMERIC CURCUMIN PO Take 1,500 mg by mouth daily.     Allergies:   Lisinopril, Sulfa antibiotics, and Sulfacetamide sodium   Social History   Socioeconomic History   Marital status: Married    Spouse name: Not on file   Number of children: 3   Years of education: Not on file   Highest education level: Not on file  Occupational History   Occupation: Hotel manager  Tobacco Use   Smoking status: Former    Types: Cigars    Quit date: 07/21/1995    Years since quitting: 25.8   Smokeless tobacco: Never  Vaping Use   Vaping Use: Never used  Substance and Sexual Activity   Alcohol use: Not Currently    Comment: social   Drug use: No   Sexual activity: Not on file    Comment: MARRIED  Other Topics Concern   Not on file  Social History Narrative   Not on file   Social Determinants of Health   Financial Resource Strain: Not on file  Food Insecurity: Not on file  Transportation Needs: Not on file  Physical Activity: Not on file  Stress: Not on file  Social Connections: Not on file     Family History: The patient's family history includes AAA (abdominal aortic aneurysm) in his father; Cancer in his maternal grandfather; Healthy in his mother, son, son,  and son; Heart attack (age of onset: 90) in his father; Multiple sclerosis in his son. There is no history of Allergic rhinitis, Angioedema, Asthma, Atopy, Eczema, Immunodeficiency, or Urticaria.  ROS:   Please see the history of present illness.     All other systems reviewed and are negative.  EKGs/Labs/Other Studies Reviewed:    The following studies were reviewed today:   EKG:     Recent Labs: 05/27/2021: ALT 20; BUN 12; Creatinine, Ser 0.90; Hemoglobin 12.9; Platelets 122; Potassium 3.6; Sodium 142  Recent Lipid Panel  Component Value Date/Time   CHOL 126 05/28/2021 0334   TRIG 193 (H) 05/28/2021 0334   HDL 36 (L) 05/28/2021 0334   CHOLHDL 3.5 05/28/2021 0334   VLDL 39 05/28/2021 0334   LDLCALC 51 05/28/2021 0334     Risk Assessment/Calculations:           Physical Exam:    VS:  BP 134/88   Pulse 67   Ht 5\' 10"  (1.778 m)   Wt (!) 315 lb (142.9 kg)   SpO2 95%   BMI 45.20 kg/m     Wt Readings from Last 3 Encounters:  05/30/21 (!) 315 lb (142.9 kg)  05/27/21 (!) 318 lb (144.2 kg)  02/05/21 (!) 318 lb (144.2 kg)     GEN:  elderly , morbidly obese male,  NAD   HEENT: Normal NECK: No JVD; No carotid bruits LYMPHATICS: No lymphadenopathy CARDIAC: RRR, no murmurs, rubs, gallops RESPIRATORY:  Clear to auscultation without rales, wheezing or rhonchi  ABDOMEN: Soft, non-tender, non-distended MUSCULOSKELETAL:  No edema; No deformity  SKIN: Warm and dry NEUROLOGIC:  Alert and oriented x 3 PSYCHIATRIC:  Normal affect   ASSESSMENT:    1. TIA (transient ischemic attack)   2. Class 3 severe obesity due to excess calories with serious comorbidity and body mass index (BMI) of 45.0 to 49.9 in adult ([place])   3. Hypertension, unspecified type   4. Hyperlipidemia, unspecified hyperlipidemia type   5. OSA (obstructive sleep apnea)    PLAN:    In order of problems listed above:  TIA :  workup  by by neuro.   There is no mention of needing a TEE .   Will check with  Dr. Leonie Man. I would like to get a 14 day event moniitor  Further plans / management per neuro .   I have discussed with Dr. Leonie Man.   Patient does not need a TEE or implantable loop recorder.   Cont with 14 day event monitor.   2. Morbid obesity - he is enrolling in a weight loss program at [facility]   3.  HTN: BP is well controlled.   4.  Hyperlipidemia :   chol levels look great.  Trigs are elevated.   Advised better diet , exercise and weight loss.            Medication Adjustments/Labs and Tests Ordered: Current medicines are reviewed at length with the patient today.  Concerns regarding medicines are outlined above.  Orders Placed This Encounter  Procedures   LONG TERM MONITOR (3-14 DAYS)    No orders of the defined types were placed in this encounter.   Patient Instructions  Medication Instructions:  Your physician recommends that you continue on your current medications as directed. Please refer to the Current Medication list given to you today.  *If you need a refill on your cardiac medications before your next appointment, please call your pharmacy*   Lab Work: None ordered.  If you have labs (blood work) drawn today and your tests are completely normal, you will receive your results only by: [facility] (if you have MyChart) OR A paper copy in the mail If you have any lab test that is abnormal or we need to change your treatment, we will call you to review the results.   Testing/Procedures: [place]- Long Term Monitor Instructions  Your physician has requested you wear a ZIO patch monitor for 14 days.  This is a single patch monitor. [employer] supplies one patch monitor per  enrollment. Additional stickers are not available. Please do not apply patch if you will be having a Nuclear Stress Test,  Echocardiogram, Cardiac CT, MRI, or Chest Xray during the period you would be wearing the  monitor. The patch cannot be worn during these tests. You cannot remove and  re-apply the  ZIO XT patch monitor.  Your ZIO patch monitor will be mailed 3 day USPS to your address on file. It may take 3-5 days  to receive your monitor after you have been enrolled.  Once you have received your monitor, please review the enclosed instructions. Your monitor  has already been registered assigning a specific monitor serial # to you.  Billing and Patient Assistance Program Information  We have supplied [employer] with any of your insurance information on file for billing purposes. [employer] offers a sliding scale Patient Assistance Program for patients that do not have  insurance, or whose insurance does not completely cover the cost of the ZIO monitor.  You must apply for the Patient Assistance Program to qualify for this discounted rate.  To apply, please call [employer] at [phone], select option 4, select option 2, ask to apply for  Patient Assistance Program. Theodore Demark will ask your household income, and how many people  are in your household. They will quote your out-of-pocket cost based on that information.  [employer] will also be able to set up a 61-month, interest-free payment plan if needed.  Applying the monitor   Shave hair from upper left chest.  Hold abrader disc by orange tab. Rub abrader in 40 strokes over the upper left chest as  indicated in your monitor instructions.  Clean area with 4 enclosed alcohol pads. Let dry.  Apply patch as indicated in monitor instructions. Patch will be placed under collarbone on left  side of chest with arrow pointing upward.  Rub patch adhesive wings for 2 minutes. Remove white label marked "1". Remove the white  label marked "2". Rub patch adhesive wings for 2 additional minutes.  While looking in a mirror, press and release button in center of patch. A small green light will  flash 3-4 times. This will be your only indicator that the monitor has been turned on.  Do not shower for the first 24 hours. You may shower after the  first 24 hours.  Press the button if you feel a symptom. You will hear a small click. Record Date, Time and  Symptom in the Patient Logbook.  When you are ready to remove the patch, follow instructions on the last 2 pages of Patient  Logbook. Stick patch monitor onto the last page of Patient Logbook.  Place Patient Logbook in the blue and white box. Use locking tab on box and tape box closed  securely. The blue and white box has prepaid postage on it. Please place it in the mailbox as  soon as possible. Your physician should have your test results approximately 7 days after the  monitor has been mailed back to [facility].  Call [place] at [phone] if you have questions regarding  your ZIO XT patch monitor. Call them immediately if you see an orange light blinking on your  monitor.  If your monitor falls off in less than 4 days, contact our Monitor department at [phone].  If your monitor becomes loose or falls off after 4 days call [employer] at [phone] for  suggestions on securing your monitor    Follow-Up: At [facility], you and your health needs  are our priority.  As part of our continuing mission to provide you with exceptional heart care, we have created designated Provider Care Teams.  These Care Teams include your primary Cardiologist (physician) and Advanced Practice Providers (APPs -  Physician Assistants and Nurse Practitioners) who all work together to provide you with the care you need, when you need it.  We recommend signing up for the patient portal called "MyChart".  Sign up information is provided on this After Visit Summary.  MyChart is used to connect with patients for Virtual Visits (Telemedicine).  Patients are able to view lab/test results, encounter notes, upcoming appointments, etc.  Non-urgent messages can be sent to your provider as well.   To learn more about what you can do with MyChart, go to [email or web address].     Your next appointment:   You will see an APP in 6 months.  We will send you a reminder to schedule   Signed, Mertie Moores, MD  05/30/2021 11:28 AM    [place]

## 2021-05-30 NOTE — Progress Notes (Unsigned)
Enrolled patient for a 14 day Zio XT  monitor to be mailed to patients home  °

## 2021-06-05 ENCOUNTER — Telehealth: Payer: Self-pay | Admitting: Allergy

## 2021-06-05 DIAGNOSIS — E8881 Metabolic syndrome: Secondary | ICD-10-CM | POA: Insufficient documentation

## 2021-06-05 DIAGNOSIS — E786 Lipoprotein deficiency: Secondary | ICD-10-CM | POA: Diagnosis not present

## 2021-06-05 DIAGNOSIS — G4733 Obstructive sleep apnea (adult) (pediatric): Secondary | ICD-10-CM | POA: Diagnosis not present

## 2021-06-05 DIAGNOSIS — R635 Abnormal weight gain: Secondary | ICD-10-CM | POA: Diagnosis not present

## 2021-06-05 DIAGNOSIS — R7303 Prediabetes: Secondary | ICD-10-CM | POA: Diagnosis not present

## 2021-06-05 DIAGNOSIS — Z6841 Body Mass Index (BMI) 40.0 and over, adult: Secondary | ICD-10-CM | POA: Diagnosis not present

## 2021-06-05 DIAGNOSIS — E781 Pure hyperglyceridemia: Secondary | ICD-10-CM | POA: Diagnosis not present

## 2021-06-05 DIAGNOSIS — I10 Essential (primary) hypertension: Secondary | ICD-10-CM | POA: Diagnosis not present

## 2021-06-05 NOTE — Telephone Encounter (Signed)
Patient is requesting a refill for Azelastine to be sent to [facility] on [address].

## 2021-06-05 NOTE — Telephone Encounter (Signed)
Patient is due for an office visit for refills however I can send in a courtesy refill. The only issue is I don't see this mentioned in any past office visits. Did you want the patient to be on this medication?

## 2021-06-06 MED ORDER — AZELASTINE HCL 0.1 % NA SOLN: 1.0000 | Freq: Two times a day (BID) | NASAL | 0 refills | Status: AC | PRN

## 2021-06-06 NOTE — Telephone Encounter (Signed)
Sent in azelastine rx.

## 2021-06-08 DIAGNOSIS — G459 Transient cerebral ischemic attack, unspecified: Secondary | ICD-10-CM | POA: Diagnosis not present

## 2021-06-09 DIAGNOSIS — M25562 Pain in left knee: Secondary | ICD-10-CM | POA: Diagnosis not present

## 2021-06-09 DIAGNOSIS — M170 Bilateral primary osteoarthritis of knee: Secondary | ICD-10-CM | POA: Diagnosis not present

## 2021-06-17 DIAGNOSIS — E785 Hyperlipidemia, unspecified: Secondary | ICD-10-CM | POA: Diagnosis not present

## 2021-06-17 DIAGNOSIS — Z8673 Personal history of transient ischemic attack (TIA), and cerebral infarction without residual deficits: Secondary | ICD-10-CM | POA: Diagnosis not present

## 2021-06-17 DIAGNOSIS — I10 Essential (primary) hypertension: Secondary | ICD-10-CM | POA: Diagnosis not present

## 2021-06-19 DIAGNOSIS — R7303 Prediabetes: Secondary | ICD-10-CM | POA: Diagnosis not present

## 2021-06-19 DIAGNOSIS — F32A Depression, unspecified: Secondary | ICD-10-CM | POA: Diagnosis not present

## 2021-06-19 DIAGNOSIS — E291 Testicular hypofunction: Secondary | ICD-10-CM | POA: Diagnosis not present

## 2021-06-19 DIAGNOSIS — G4733 Obstructive sleep apnea (adult) (pediatric): Secondary | ICD-10-CM | POA: Diagnosis not present

## 2021-06-19 DIAGNOSIS — I10 Essential (primary) hypertension: Secondary | ICD-10-CM | POA: Diagnosis not present

## 2021-06-20 DIAGNOSIS — G459 Transient cerebral ischemic attack, unspecified: Secondary | ICD-10-CM | POA: Diagnosis not present

## 2021-06-21 DIAGNOSIS — E782 Mixed hyperlipidemia: Secondary | ICD-10-CM | POA: Insufficient documentation

## 2021-06-25 ENCOUNTER — Ambulatory Visit: Payer: Medicare Other | Admitting: Orthopaedic Surgery

## 2021-06-26 DIAGNOSIS — Z6841 Body Mass Index (BMI) 40.0 and over, adult: Secondary | ICD-10-CM | POA: Diagnosis not present

## 2021-06-26 DIAGNOSIS — E669 Obesity, unspecified: Secondary | ICD-10-CM | POA: Diagnosis not present

## 2021-07-04 ENCOUNTER — Other Ambulatory Visit: Payer: Self-pay | Admitting: Allergy

## 2021-07-04 DIAGNOSIS — E669 Obesity, unspecified: Secondary | ICD-10-CM | POA: Diagnosis not present

## 2021-07-04 DIAGNOSIS — Z6841 Body Mass Index (BMI) 40.0 and over, adult: Secondary | ICD-10-CM | POA: Diagnosis not present

## 2021-07-07 DIAGNOSIS — M170 Bilateral primary osteoarthritis of knee: Secondary | ICD-10-CM | POA: Diagnosis not present

## 2021-07-10 DIAGNOSIS — E669 Obesity, unspecified: Secondary | ICD-10-CM | POA: Diagnosis not present

## 2021-07-10 DIAGNOSIS — Z6841 Body Mass Index (BMI) 40.0 and over, adult: Secondary | ICD-10-CM | POA: Diagnosis not present

## 2021-07-11 DIAGNOSIS — Z20822 Contact with and (suspected) exposure to covid-19: Secondary | ICD-10-CM | POA: Diagnosis not present

## 2021-07-11 DIAGNOSIS — U071 COVID-19: Secondary | ICD-10-CM | POA: Diagnosis not present

## 2021-07-22 DIAGNOSIS — Z961 Presence of intraocular lens: Secondary | ICD-10-CM | POA: Diagnosis not present

## 2021-07-22 DIAGNOSIS — H34231 Retinal artery branch occlusion, right eye: Secondary | ICD-10-CM | POA: Diagnosis not present

## 2021-07-22 DIAGNOSIS — H40023 Open angle with borderline findings, high risk, bilateral: Secondary | ICD-10-CM | POA: Diagnosis not present

## 2021-07-29 ENCOUNTER — Ambulatory Visit (INDEPENDENT_AMBULATORY_CARE_PROVIDER_SITE_OTHER): Payer: Medicare Other | Admitting: Neurology

## 2021-07-29 ENCOUNTER — Encounter: Payer: Self-pay | Admitting: Neurology

## 2021-07-29 VITALS — BP 131/69 | HR 60 | Ht 70.0 in | Wt 308.4 lb

## 2021-07-29 DIAGNOSIS — H34231 Retinal artery branch occlusion, right eye: Secondary | ICD-10-CM | POA: Diagnosis not present

## 2021-07-29 DIAGNOSIS — H5360 Unspecified night blindness: Secondary | ICD-10-CM

## 2021-07-29 NOTE — Progress Notes (Signed)
[facility] [address] [place]. [place] [address] [phone]       OFFICE FOLLOW-UP NOTE  Erik Wiggins Date of Birth:  20-Jul-1946 Medical Record Number:  [id number]   HPI: Erik Wiggins is 76 year old Caucasian male seen today for first office follow-up visit following hospital admission for stroke in November 2022.  History is obtained from the patient and review of electronic medical records and opossum reviewed available pertinent imaging from in PACS.  He has past medical history of hypertension, hyperlipidemia, obesity, obstructive sleep apnea on CPAP and asthma.  He presented on 05/27/2021 with sudden onset of right eye inferior nasal visual field defect.  He got up in the morning and was fine and while driving down the road a few hours later he noticed sudden onset of vision loss with a dark spot in the right eye in the lower nasal visual field.  He initially thought this was a sunglasses and tried to remove his glasses but this spot persisted.  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  He was referred to the ED for urgent evaluation.  He presented outside window for IV thrombolysis.  His work-up included an MRI scan of the brain as well as orbits which were both normal.  CT angiogram of the head and neck was obtained which showed subtle intimal irregularity of the horizontal petrous portion of the right internal carotid artery suspicious for atheroma versus focal dissection.  Hemoglobin A1c was 6.0.  Urine drug screen was negative.  LDL cholesterol was optimal at 51 mg percent.  Echocardiogram showed ejection fraction of 60 to 65% without cardiac source of embolism.  Telemetry monitoring during the hospitalization followed by outpatient 1 week of cardiac monitoring revealed no evidence of atrial fibrillation and only very few brief episodes of nonsustained SVT.  Patient was started on aspirin and  Plavix for 3 weeks followed by Plavix alone which is tolerating well without bleeding or bruising.  His right eye vision loss still persists and is unchanged.  He is started eating healthy and has joined [facility] program and lost 10 pounds.  He also exercise regularly and goes to the gym.  He is has been able to drive but has to be careful.  He has no new complaints.  He has been compliant with using his CPAP every night.  His blood pressure is under good control today it is 131/69.  He has no prior history of strokes TIAs or significant neurological problems.  ROS:   14 system review of systems is positive for vision loss, bruising and all other systems negative  PMH:  Past Medical History:  Diagnosis Date   Allergic rhinitis    Androgen deficiency    Asthma    Asthma, mild intermittent, well-controlled 12/22/2014   Erectile dysfunction    High cholesterol    Hyperlipemia    Hypertension    Low testosterone    Major depressive disorder, recurrent episode, in partial remission ([place])    Metatarsalgia    Moderate persistent asthma, uncomplicated 92/05/9416   Nasal polyps 06/21/2014   Demonstrated on previous sinus CT by report. No history of aspirin intolerance    Obesity 06/21/2014   Onychomycosis    OSA (obstructive sleep apnea)    OSA, uses CPAP nightly   Perennial allergic rhinitis 06/21/2014   Allergy vaccine started 2012 in [place]    Umbilical  hernia    Vitamin D deficiency     Social History:  Social History   Socioeconomic History   Marital status: Married    Spouse name: Not on file   Number of children: 3   Years of education: Not on file   Highest education level: Not on file  Occupational History   Occupation: Hotel manager  Tobacco Use   Smoking status: Former    Types: Cigars    Quit date: 07/21/1995    Years since quitting: 26.0   Smokeless tobacco: Never  Vaping Use   Vaping Use: Never used  Substance and  Sexual Activity   Alcohol use: Not Currently    Comment: social   Drug use: No   Sexual activity: Not on file    Comment: MARRIED  Other Topics Concern   Not on file  Social History Narrative   Not on file   Social Determinants of Health   Financial Resource Strain: Not on file  Food Insecurity: Not on file  Transportation Needs: Not on file  Physical Activity: Not on file  Stress: Not on file  Social Connections: Not on file  Intimate Partner Violence: Not on file    Medications:   Current Outpatient Medications on File Prior to Visit  Medication Sig Dispense Refill   albuterol (PROAIR HFA) 108 (90 Base) MCG/ACT inhaler Inhale 4 puffs into the lungs every 4 (four) hours as needed for wheezing or shortness of breath. 2 puffs every 6 hrs as needed 1 Inhaler 2   azelastine (ASTELIN) 0.1 % nasal spray Place 1-2 sprays into both nostrils 2 (two) times daily as needed (nasal drainage). Use in each nostril as directed 30 mL 0   cetirizine (ZYRTEC) 10 MG tablet Take 10 mg by mouth daily.     Cholecalciferol (VITAMIN D) 2000 units CAPS Take 2,000 Units by mouth 2 (two) times daily.     clopidogrel (PLAVIX) 75 MG tablet Take 1 tablet (75 mg total) by mouth daily. 90 tablet 3   indapamide (LOZOL) 2.5 MG tablet Take 2.5 mg by mouth daily.      ipratropium (ATROVENT) 0.03 % nasal spray Place 1 spray into both nostrils daily as needed for rhinitis.  6   montelukast (SINGULAIR) 10 MG tablet Take 10 mg by mouth at bedtime.      Multiple Vitamin (MULTIVITAMIN WITH MINERALS) TABS tablet Take 1 tablet by mouth daily. Men's  One A Day over 50     Polyethyl Glycol-Propyl Glycol 0.4-0.3 % SOLN Place 2 drops into both eyes daily as needed (for dry eyes).     rosuvastatin (CRESTOR) 20 MG tablet Take 1 tablet (20 mg total) by mouth daily. 90 tablet 3   sertraline (ZOLOFT) 50 MG tablet Take 50 mg by mouth daily.      Triamcinolone Acetonide (NASACORT ALLERGY 24HR NA) Place 1 spray into the nose daily as  needed (congestion).     TURMERIC CURCUMIN PO Take 1,500 mg by mouth daily.     No current facility-administered medications on file prior to visit.    Allergies:   Allergies  Allergen Reactions   Lisinopril Swelling and Other (See Comments)    Swollen lips   Sulfa Antibiotics Hives   Sulfacetamide Sodium Other (See Comments)    Physical Exam General: Obese elderly Caucasian male, seated, in no evident distress Head: head normocephalic and atraumatic.  Neck: supple with no carotid or supraclavicular bruits Cardiovascular: regular rate and rhythm, no murmurs Musculoskeletal: no deformity  Skin:  no rash/petichiae Vascular:  Normal pulses all extremities Vitals:   07/29/21 0821  BP: 131/69  Pulse: 60   Neurologic Exam Mental Status: Awake and fully alert. Oriented to place and time. Recent and remote memory intact. Attention span, concentration and fund of knowledge appropriate. Mood and affect appropriate.  Cranial Nerves: Fundoscopic exam reveals sharp disc margins. Pupils equal, briskly reactive to light.  Right eye inferior nasal visual field defect.  Extraocular movements full without nystagmus. Visual fields full to confrontation. Hearing intact. Facial sensation intact. Face, tongue, palate moves normally and symmetrically.  Motor: Normal bulk and tone. Normal strength in all tested extremity muscles. Sensory.: intact to touch ,pinprick .position and vibratory sensation.  Coordination: Rapid alternating movements normal in all extremities. Finger-to-nose and heel-to-shin performed accurately bilaterally. Gait and Station: Arises from chair without difficulty. Stance is normal.  Uses a cane for ambulation.  Gait demonstrates normal stride length and balance .  Not able to heel, toe and tandem walk without difficulty.  Reflexes: 1+ and symmetric. Toes downgoing.   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  Vascular risk factors of hyperlipidemia, obesity, sleep apnea and atherosclerosis.     PLAN:I had a long d/w patient about his recent right eye partial vision loss due to retinal artery branch occlusion, intracranial atherosclerosis, risk for recurrent stroke/TIAs, personally independently reviewed imaging studies and stroke evaluation results and answered questions.Continue Plavix 75 mg daily  for secondary stroke prevention and maintain strict control of hypertension with blood pressure goal below 130/90, diabetes with hemoglobin A1c goal below 6.5% and lipids with LDL cholesterol goal below 70 mg/dL. I also advised the patient to eat a healthy diet with plenty of whole grains, cereals, fruits and vegetables, exercise regularly and maintain ideal body weight I also counseled the patient to use a CPAP every night for his sleep apnea.  Followup in the future with me in 6 months or call earlier if necessary.Greater than 50% of time during this 35 minute visit was spent on counseling,explanation of diagnosis of retinal artery branch occlusion ,stroke risk, planning of further management, discussion with patient and family and coordination of care Antony Contras, MD Note: This document was prepared with digital dictation and possible smart phrase technology. Any transcriptional errors that result from this process are unintentional

## 2021-07-29 NOTE — Patient Instructions (Signed)
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 I also advised the patient to eat a healthy diet with plenty of whole grains, cereals, fruits and vegetables, exercise regularly and maintain ideal body weight I also counseled the patient to use a CPAP every night for his sleep apnea.  Followup in the future with me in 6 months or call earlier if necessary. Stroke Prevention Some medical conditions and behaviors can lead to a higher chance of having a stroke. You can help prevent a stroke by eating healthy, exercising, not smoking, and managing any medical conditions you have. Stroke is a leading cause of functional impairment. Primary prevention is particularly important because a majority of strokes are first-time events. Stroke changes the lives of not only those who experience a stroke but also their family and other caregivers. How can this condition affect me? A stroke is a medical emergency and should be treated right away. A stroke can lead to brain damage and can sometimes be life-threatening. If a person gets medical treatment right away, there is a better chance of surviving and recovering from a stroke. What can increase my risk? The following medical conditions may increase your risk of a stroke: Cardiovascular disease. High blood pressure (hypertension). Diabetes. High cholesterol. Sickle cell disease. Blood clotting disorders (hypercoagulable state). Obesity. Sleep disorders (obstructive sleep apnea). Other risk factors  include: Being older than age 64. Having a history of blood clots, stroke, or mini-stroke (transient ischemic attack, TIA). Genetic factors, such as race, ethnicity, or a family history of stroke. Smoking cigarettes or using other tobacco products. Taking birth control pills, especially if you also use tobacco. Heavy use of alcohol or drugs, especially cocaine and methamphetamine. Physical inactivity. What actions can I take to prevent this? Manage your health conditions High cholesterol levels. Eating a healthy diet is important for preventing high cholesterol. If cholesterol cannot be managed through diet alone, you may need to take medicines. Take any prescribed medicines to control your cholesterol as told by your health care provider. Hypertension. To reduce your risk of stroke, try to keep your blood pressure below 130/80. Eating a healthy diet and exercising regularly are important for controlling blood pressure. If these steps are not enough to manage your blood pressure, you may need to take medicines. Take any prescribed medicines to control hypertension as told by your health care provider. Ask your health care provider if you should monitor your blood pressure at home. Have your blood pressure checked every year, even if your blood pressure is normal. Blood pressure increases with age and some medical conditions. Diabetes. Eating a healthy diet and exercising regularly are important parts of managing your blood sugar (glucose). If your blood sugar cannot be managed through diet and exercise, you may need to take medicines. Take any prescribed medicines to control your diabetes as told by your health care provider. Get evaluated for obstructive sleep apnea. Talk to your health care provider about getting a sleep evaluation if you snore a lot or have excessive sleepiness. Make sure that any other medical conditions you have, such as atrial fibrillation or atherosclerosis,  are  managed. Nutrition Follow instructions from your health care provider about what to eat or drink to help manage your health condition. These instructions may include: Reducing your daily calorie intake. Limiting how much salt (sodium) you use to 1,500 milligrams (mg) each day. Using only healthy fats for cooking, such as olive oil, canola oil, or sunflower oil. Eating healthy foods. You can do this by: Choosing foods that are high in fiber, such as whole grains, and fresh fruits and vegetables. Eating at least 5 servings of fruits and vegetables a day. Try to fill one-half of your plate with fruits and vegetables at each meal. Choosing lean protein foods, such as lean cuts of meat, poultry without skin, fish, tofu, beans, and nuts. Eating low-fat dairy products. Avoiding foods that are high in sodium. This can help lower blood pressure. Avoiding foods that have saturated fat, trans fat, and cholesterol. This can help prevent high cholesterol. Avoiding processed and prepared foods. Counting your daily carbohydrate intake.  Lifestyle If you drink alcohol: Limit how much you have to: 0-1 drink a day for women who are not pregnant. 0-2 drinks a day for men. Know how much alcohol is in your drink. In the [place] one drink equals one 12 oz bottle of beer (353mL), one 5 oz glass of wine (185mL), or one 1 oz glass of hard liquor (49mL). Do not use any products that contain nicotine or tobacco. These products include cigarettes, chewing tobacco, and vaping devices, such as e-cigarettes. If you need help quitting, ask your health care provider. Avoid secondhand smoke. Do not use drugs. Activity  Try to stay at a healthy weight. Get at least 30 minutes of exercise on most days, such as: Fast walking. Biking. Swimming. Medicines Take over-the-counter and prescription medicines only as told by your health care provider. Aspirin or blood thinners (antiplatelets or anticoagulants) may be recommended  to reduce your risk of forming blood clots that can lead to stroke. Avoid taking birth control pills. Talk to your health care provider about the risks of taking birth control pills if: You are over 59 years old. You smoke. You get very bad headaches. You have had a blood clot. Where to find more information American Stroke Association: www.strokeassociation.org Get help right away if: You or a loved one has any symptoms of a stroke. "BE FAST" is an easy way to remember the main warning signs of a stroke: B - Balance. Signs are dizziness, sudden trouble walking, or loss of balance. E - Eyes. Signs are trouble seeing or a sudden change in vision. F - Face. Signs are sudden weakness or numbness of the face, or the face or eyelid drooping on one side. A - Arms. Signs are weakness or numbness in an arm. This happens suddenly and usually on one side of the body. S - Speech. Signs are sudden trouble speaking, slurred speech, or trouble understanding what people say. T - Time. Time to call emergency services. Write down what time symptoms started. You or a loved one has other signs of a stroke, such as: A sudden, severe headache with no known cause. Nausea or vomiting. Seizure. These symptoms may represent a serious problem that is an emergency. Do not wait to see if the symptoms will go away. Get medical help right away. Call your local emergency services (911 in the [place].S[place] Do not drive yourself to the hospital. Summary You can help to prevent a stroke by eating healthy, exercising, not smoking, limiting alcohol intake,  and managing any medical conditions you may have. Do not use any products that contain nicotine or tobacco. These include cigarettes, chewing tobacco, and vaping devices, such as e-cigarettes. If you need help quitting, ask your health care provider. Remember "BE FAST" for warning signs of a stroke. Get help right away if you or a loved one has any of these signs. This information  is not intended to replace advice given to you by your health care provider. Make sure you discuss any questions you have with your health care provider. Document Revised: 02/05/2020 Document Reviewed: 02/05/2020 Elsevier Patient Education  [place].

## 2021-08-06 DIAGNOSIS — M170 Bilateral primary osteoarthritis of knee: Secondary | ICD-10-CM | POA: Diagnosis not present

## 2021-08-07 DIAGNOSIS — Z6841 Body Mass Index (BMI) 40.0 and over, adult: Secondary | ICD-10-CM | POA: Diagnosis not present

## 2021-08-11 DIAGNOSIS — Z7189 Other specified counseling: Secondary | ICD-10-CM | POA: Diagnosis not present

## 2021-08-11 DIAGNOSIS — G4733 Obstructive sleep apnea (adult) (pediatric): Secondary | ICD-10-CM | POA: Diagnosis not present

## 2021-08-13 DIAGNOSIS — M170 Bilateral primary osteoarthritis of knee: Secondary | ICD-10-CM | POA: Diagnosis not present

## 2021-08-15 DIAGNOSIS — M170 Bilateral primary osteoarthritis of knee: Secondary | ICD-10-CM | POA: Insufficient documentation

## 2021-08-27 DIAGNOSIS — H43812 Vitreous degeneration, left eye: Secondary | ICD-10-CM | POA: Diagnosis not present

## 2021-08-27 DIAGNOSIS — H35373 Puckering of macula, bilateral: Secondary | ICD-10-CM | POA: Diagnosis not present

## 2021-08-27 DIAGNOSIS — H34831 Tributary (branch) retinal vein occlusion, right eye, with macular edema: Secondary | ICD-10-CM | POA: Diagnosis not present

## 2021-08-27 DIAGNOSIS — H53411 Scotoma involving central area, right eye: Secondary | ICD-10-CM | POA: Diagnosis not present

## 2021-08-27 DIAGNOSIS — M170 Bilateral primary osteoarthritis of knee: Secondary | ICD-10-CM | POA: Diagnosis not present

## 2021-08-27 DIAGNOSIS — H5315 Visual distortions of shape and size: Secondary | ICD-10-CM | POA: Diagnosis not present

## 2021-08-27 DIAGNOSIS — H3582 Retinal ischemia: Secondary | ICD-10-CM | POA: Diagnosis not present

## 2021-09-10 DIAGNOSIS — Z125 Encounter for screening for malignant neoplasm of prostate: Secondary | ICD-10-CM | POA: Diagnosis not present

## 2021-09-10 DIAGNOSIS — E785 Hyperlipidemia, unspecified: Secondary | ICD-10-CM | POA: Diagnosis not present

## 2021-09-10 DIAGNOSIS — I129 Hypertensive chronic kidney disease with stage 1 through stage 4 chronic kidney disease, or unspecified chronic kidney disease: Secondary | ICD-10-CM | POA: Diagnosis not present

## 2021-09-11 DIAGNOSIS — E669 Obesity, unspecified: Secondary | ICD-10-CM | POA: Diagnosis not present

## 2021-09-11 DIAGNOSIS — Z6841 Body Mass Index (BMI) 40.0 and over, adult: Secondary | ICD-10-CM | POA: Diagnosis not present

## 2021-09-15 DIAGNOSIS — I700 Atherosclerosis of aorta: Secondary | ICD-10-CM | POA: Diagnosis not present

## 2021-09-15 DIAGNOSIS — B351 Tinea unguium: Secondary | ICD-10-CM | POA: Diagnosis not present

## 2021-09-15 DIAGNOSIS — F339 Major depressive disorder, recurrent, unspecified: Secondary | ICD-10-CM | POA: Diagnosis not present

## 2021-09-15 DIAGNOSIS — I6529 Occlusion and stenosis of unspecified carotid artery: Secondary | ICD-10-CM | POA: Diagnosis not present

## 2021-09-15 DIAGNOSIS — M8580 Other specified disorders of bone density and structure, unspecified site: Secondary | ICD-10-CM | POA: Diagnosis not present

## 2021-09-15 DIAGNOSIS — J309 Allergic rhinitis, unspecified: Secondary | ICD-10-CM | POA: Diagnosis not present

## 2021-09-15 DIAGNOSIS — L578 Other skin changes due to chronic exposure to nonionizing radiation: Secondary | ICD-10-CM | POA: Diagnosis not present

## 2021-09-15 DIAGNOSIS — G4733 Obstructive sleep apnea (adult) (pediatric): Secondary | ICD-10-CM | POA: Diagnosis not present

## 2021-09-15 DIAGNOSIS — I2510 Atherosclerotic heart disease of native coronary artery without angina pectoris: Secondary | ICD-10-CM | POA: Diagnosis not present

## 2021-09-15 DIAGNOSIS — I10 Essential (primary) hypertension: Secondary | ICD-10-CM | POA: Diagnosis not present

## 2021-09-15 DIAGNOSIS — Z0000 Encounter for general adult medical examination without abnormal findings: Secondary | ICD-10-CM | POA: Diagnosis not present

## 2021-09-24 DIAGNOSIS — Z6841 Body Mass Index (BMI) 40.0 and over, adult: Secondary | ICD-10-CM | POA: Diagnosis not present

## 2021-09-24 DIAGNOSIS — E669 Obesity, unspecified: Secondary | ICD-10-CM | POA: Diagnosis not present

## 2021-10-20 DIAGNOSIS — M170 Bilateral primary osteoarthritis of knee: Secondary | ICD-10-CM | POA: Diagnosis not present

## 2021-11-18 DIAGNOSIS — M549 Dorsalgia, unspecified: Secondary | ICD-10-CM | POA: Diagnosis not present

## 2021-11-18 DIAGNOSIS — M7918 Myalgia, other site: Secondary | ICD-10-CM | POA: Diagnosis not present

## 2021-11-26 DIAGNOSIS — M71342 Other bursal cyst, left hand: Secondary | ICD-10-CM | POA: Diagnosis not present

## 2021-11-26 DIAGNOSIS — B078 Other viral warts: Secondary | ICD-10-CM | POA: Diagnosis not present

## 2021-11-26 DIAGNOSIS — M71341 Other bursal cyst, right hand: Secondary | ICD-10-CM | POA: Diagnosis not present

## 2021-11-28 DIAGNOSIS — M549 Dorsalgia, unspecified: Secondary | ICD-10-CM | POA: Diagnosis not present

## 2021-11-28 DIAGNOSIS — M7918 Myalgia, other site: Secondary | ICD-10-CM | POA: Diagnosis not present

## 2021-12-08 DIAGNOSIS — M542 Cervicalgia: Secondary | ICD-10-CM | POA: Insufficient documentation

## 2021-12-08 DIAGNOSIS — M546 Pain in thoracic spine: Secondary | ICD-10-CM | POA: Diagnosis not present

## 2021-12-22 DIAGNOSIS — M546 Pain in thoracic spine: Secondary | ICD-10-CM | POA: Diagnosis not present

## 2021-12-22 DIAGNOSIS — M542 Cervicalgia: Secondary | ICD-10-CM | POA: Diagnosis not present

## 2022-01-15 DIAGNOSIS — H35373 Puckering of macula, bilateral: Secondary | ICD-10-CM | POA: Diagnosis not present

## 2022-01-15 DIAGNOSIS — Z961 Presence of intraocular lens: Secondary | ICD-10-CM | POA: Diagnosis not present

## 2022-01-15 DIAGNOSIS — D3131 Benign neoplasm of right choroid: Secondary | ICD-10-CM | POA: Diagnosis not present

## 2022-01-15 DIAGNOSIS — H348312 Tributary (branch) retinal vein occlusion, right eye, stable: Secondary | ICD-10-CM | POA: Diagnosis not present

## 2022-01-28 ENCOUNTER — Ambulatory Visit: Payer: Medicare Other | Admitting: Neurology

## 2022-02-02 DIAGNOSIS — Z1211 Encounter for screening for malignant neoplasm of colon: Secondary | ICD-10-CM | POA: Diagnosis not present

## 2022-02-02 DIAGNOSIS — Z1212 Encounter for screening for malignant neoplasm of rectum: Secondary | ICD-10-CM | POA: Diagnosis not present

## 2022-02-20 ENCOUNTER — Ambulatory Visit: Payer: Medicare Other | Admitting: Cardiovascular Disease

## 2022-03-01 ENCOUNTER — Encounter: Payer: Self-pay | Admitting: Physician Assistant

## 2022-03-01 DIAGNOSIS — E785 Hyperlipidemia, unspecified: Secondary | ICD-10-CM | POA: Insufficient documentation

## 2022-03-01 DIAGNOSIS — I2510 Atherosclerotic heart disease of native coronary artery without angina pectoris: Secondary | ICD-10-CM | POA: Insufficient documentation

## 2022-03-01 DIAGNOSIS — I77810 Thoracic aortic ectasia: Secondary | ICD-10-CM | POA: Insufficient documentation

## 2022-03-01 HISTORY — DX: Thoracic aortic ectasia: I77.810

## 2022-03-01 HISTORY — DX: Atherosclerotic heart disease of native coronary artery without angina pectoris: I25.10

## 2022-03-01 NOTE — Progress Notes (Signed)
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  EF 64  History of Present Illness:   Erik Wiggins is a 76 y.o. male with the above problem list.  He was last seen by Dr. Acie Fredrickson in Nov 2022. He returns for f/u.  He is here alone.  He is doing well without chest pain, shortness of breath, syncope, orthopnea, leg edema.  He uses CPAP at night.  He is unable to exercise much due to knee pain.  Arthroscopic surgery was unsuccessful and he will likely need a knee replacement at some point in the future.  Since his knee issues started, he gained weight back and has been unable to exercise.        Past Medical History:  Diagnosis Date   Allergic rhinitis    Androgen deficiency    Ascending aorta dilation ([place]) 03/01/2022   TTE in 11/22: 44 mm   Asthma    Asthma, mild intermittent, well-controlled  12/22/2014   CAD (coronary artery disease) 03/01/2022   CAC score in 3/22: 552 (50-75 percentile)   Carotid artery disease ([place]) 03/02/2022   Head neck CTA in November 8527: R ICA 78-24, LICA 23-53   Erectile dysfunction    High cholesterol    Hyperlipemia    Hypertension    Low testosterone    Major depressive disorder, recurrent episode, in partial remission ([place])    Metatarsalgia    Moderate persistent asthma, uncomplicated 61/44/3154   Nasal polyps 06/21/2014   Demonstrated on previous sinus CT by report. No history of aspirin intolerance    Obesity 06/21/2014   Onychomycosis    OSA (obstructive sleep apnea)    OSA, uses CPAP nightly   Perennial allergic rhinitis 06/21/2014   Allergy vaccine started 2012 in [place]    Umbilical hernia    Vitamin D deficiency    Current Medications: Current Meds  Medication Sig   albuterol (PROAIR HFA) 108 (90 Base) MCG/ACT inhaler Inhale 4 puffs into the lungs every 4 (four) hours as needed for wheezing or shortness of breath. 2 puffs every 6 hrs as needed   azelastine (ASTELIN) 0.1 % nasal spray Place 1-2 sprays into both nostrils 2 (two) times daily as needed (nasal drainage). Use in each nostril as directed   cetirizine (ZYRTEC) 10 MG tablet Take 10 mg by mouth daily.   Cholecalciferol (VITAMIN D) 2000 units CAPS Take 2,000 Units by mouth  2 (two) times daily.   clopidogrel (PLAVIX) 75 MG tablet Take 75 mg by mouth daily.   indapamide (LOZOL) 2.5 MG tablet Take 2.5 mg by mouth daily.    ipratropium (ATROVENT) 0.03 % nasal spray Place 1 spray into both nostrils daily as needed for rhinitis.   montelukast (SINGULAIR) 10 MG tablet Take 10 mg by mouth at bedtime.    rosuvastatin (CRESTOR) 20 MG tablet Take 1 tablet (20 mg total) by mouth daily.   sertraline (ZOLOFT) 50 MG tablet Take 50 mg by mouth daily.    Triamcinolone Acetonide (NASACORT ALLERGY 24HR NA) Place 1 spray into the nose daily as needed (congestion).   TURMERIC CURCUMIN PO Take  1,500 mg by mouth daily.    Allergies:   Lisinopril, Sulfa antibiotics, and Sulfacetamide sodium   Social History   Tobacco Use   Smoking status: Former    Types: Cigars    Quit date: 07/21/1995    Years since quitting: 26.6   Smokeless tobacco: Never  Vaping Use   Vaping Use: Never used  Substance Use Topics   Alcohol use: Not Currently    Comment: social   Drug use: No    Family Hx: The patient's family history includes AAA (abdominal aortic aneurysm) in his father; Cancer in his maternal grandfather; Healthy in his mother, son, son, and son; Heart attack (age of onset: 31) in his father; Multiple sclerosis in his son. There is no history of Allergic rhinitis, Angioedema, Asthma, Atopy, Eczema, Immunodeficiency, or Urticaria.  Review of Systems  Neurological:  Positive for headaches.     EKGs/Labs/Other Test Reviewed:    EKG:  EKG is not ordered today.  The ekg ordered today demonstrates n/A  Recent Labs: 05/27/2021: ALT 20; BUN 12; Creatinine, Ser 0.90; Hemoglobin 12.9; Platelets 122; Potassium 3.6; Sodium 142   Recent Lipid Panel Recent Labs    05/28/21 0334  CHOL 126  TRIG 193*  HDL 36*  VLDL 39  LDLCALC 51     Risk Assessment/Calculations/Metrics:              Physical Exam:    VS:  BP 138/80   Pulse 64   Ht '5\' 10"'$  (1.778 m)   Wt (!) 310 lb 12.8 oz (141 kg)   SpO2 98%   BMI 44.60 kg/m     Wt Readings from Last 3 Encounters:  03/02/22 (!) 310 lb 12.8 oz (141 kg)  07/29/21 (!) 308 lb 6.4 oz (139.9 kg)  05/30/21 (!) 315 lb (142.9 kg)    Constitutional:      Appearance: Healthy appearance. Not in distress.  Pulmonary:     Effort: Pulmonary effort is normal.     Breath sounds: No wheezing. No rales.  Cardiovascular:     Normal rate. Regular rhythm. Normal S1. Normal S2.      Murmurs: There is no murmur.  Edema:    Peripheral edema absent.  Abdominal:     Palpations: Abdomen is soft.  Musculoskeletal:     Cervical back: Neck supple. Skin:     General: Skin is warm and dry.  Neurological:     General: No focal deficit present.     Mental Status: Alert and oriented to person, place and time.         ASSESSMENT & PLAN:   CAD (coronary artery disease) Elevated calcium score by CT in March 2022.  He is doing well without anginal symptoms.  Continue clopidogrel 75 mg daily, rosuvastatin 20 mg daily.  Ascending aorta dilation (HCC) Dilated aorta noted on echocardiogram in November 2022.  Arrange follow-up echocardiogram in November 2023.  If the aorta size is increased, he will need CTA versus MRA.  HTN (hypertension) Blood pressure has been somewhat borderline.  I have asked him to limit salt in his diet.  He is limited in his options for exercise due to knee pain.  We discussed alternatives for exercise.  I have asked him to monitor his blood pressure for 2 weeks and send his readings via MyChart for review.  If his pressures remain borderline elevated goal blood pressure is <130/80.  If needed, add amlodipine to his current medical regimen.  Continue indapamide 2.5 mg daily.  Hyperlipidemia LDL goal <70 LDL optimal on most recent lab work.  Continue current Rx.    Carotid artery disease ([place]) Arrange follow-up carotid US in November 2023.  Obesity, Class III, BMI 40-49.9 (morbid obesity) ([place]) We discussed alternatives for exercise.  We also discussed plant-based diet as well as limiting sugar.           Dispo:  Return in about 4 months (around 07/02/2022) for Follow up after testing, w/ Dr. Acie Fredrickson.   Medication Adjustments/Labs and Tests Ordered: Current medicines are reviewed at length with the patient today.  Concerns regarding medicines are outlined above.  Tests Ordered: Orders Placed This Encounter  Procedures   ECHOCARDIOGRAM COMPLETE   VAS US CAROTID   Medication Changes: No orders of the defined types were placed in this encounter.  Signed, Richardson Dopp, PA-C  03/02/2022 9:07 AM    [facility] [address], [place], [place]  [address] Phone: [phone]; Fax: [phone]

## 2022-03-02 ENCOUNTER — Encounter: Payer: Self-pay | Admitting: Physician Assistant

## 2022-03-02 ENCOUNTER — Ambulatory Visit (INDEPENDENT_AMBULATORY_CARE_PROVIDER_SITE_OTHER): Payer: Medicare Other | Admitting: Physician Assistant

## 2022-03-02 VITALS — BP 138/80 | HR 64 | Ht 70.0 in | Wt 310.8 lb

## 2022-03-02 DIAGNOSIS — I77810 Thoracic aortic ectasia: Secondary | ICD-10-CM | POA: Diagnosis not present

## 2022-03-02 DIAGNOSIS — I2510 Atherosclerotic heart disease of native coronary artery without angina pectoris: Secondary | ICD-10-CM

## 2022-03-02 DIAGNOSIS — I6523 Occlusion and stenosis of bilateral carotid arteries: Secondary | ICD-10-CM

## 2022-03-02 DIAGNOSIS — I10 Essential (primary) hypertension: Secondary | ICD-10-CM

## 2022-03-02 DIAGNOSIS — I779 Disorder of arteries and arterioles, unspecified: Secondary | ICD-10-CM | POA: Insufficient documentation

## 2022-03-02 DIAGNOSIS — E785 Hyperlipidemia, unspecified: Secondary | ICD-10-CM

## 2022-03-02 DIAGNOSIS — I7121 Aneurysm of the ascending aorta, without rupture: Secondary | ICD-10-CM | POA: Diagnosis not present

## 2022-03-02 HISTORY — DX: Disorder of arteries and arterioles, unspecified: I77.9

## 2022-03-02 NOTE — Assessment & Plan Note (Signed)
Elevated calcium score by CT in March 2022.  He is doing well without anginal symptoms.  Continue clopidogrel 75 mg daily, rosuvastatin 20 mg daily.

## 2022-03-02 NOTE — Assessment & Plan Note (Signed)
We discussed alternatives for exercise.  We also discussed plant-based diet as well as limiting sugar.

## 2022-03-02 NOTE — Patient Instructions (Addendum)
Medication Instructions:  Your physician recommends that you continue on your current medications as directed. Please refer to the Current Medication list given to you today.]  *If you need a refill on your cardiac medications before your next appointment, please call your pharmacy*   Lab Work: None ordered  If you have labs (blood work) drawn today and your tests are completely normal, you will receive your results only by: [place] (if you have MyChart) OR A paper copy in the mail If you have any lab test that is abnormal or we need to change your treatment, we will call you to review the results.   Testing/Procedures: Your physician has requested that you have an echocardiogram IN [place]. Echocardiography is a painless test that uses sound waves to create images of your heart. It provides your doctor with information about the size and shape of your heart and how well your heart's chambers and valves are working. This procedure takes approximately one hour. There are no restrictions for this procedure.  Your physician has requested that you have a carotid duplex IN NOVEMBER. This test is an ultrasound of the carotid arteries in your neck. It looks at blood flow through these arteries that supply the brain with blood. Allow one hour for this exam. There are no restrictions or special instructions.    Follow-Up: At [facility], you and your health needs are our priority.  As part of our continuing mission to provide you with exceptional heart care, we have created designated Provider Care Teams.  These Care Teams include your primary Cardiologist (physician) and Advanced Practice Providers (APPs -  Physician Assistants and Nurse Practitioners) who all work together to provide you with the care you need, when you need it.  We recommend signing up for the patient portal called "MyChart".  Sign up information is provided on this After Visit Summary.  MyChart is used to connect with  patients for Virtual Visits (Telemedicine).  Patients are able to view lab/test results, encounter notes, upcoming appointments, etc.  Non-urgent messages can be sent to your provider as well.   To learn more about what you can do with MyChart, go to [email or web address].    Your next appointment:   4 month(s) AFTER ECHO & CAROTIDS   The format for your next appointment:   In Person  Provider:   Mertie Moores, MD     Other Instructions Your physician has requested that you regularly monitor and record your blood pressure readings at home. Please use the same machine at the same time of day to check your readings and record them to bring to your follow-up visit.   Please monitor blood pressures and keep a log of your readings and in 2 weeks send in those readings either via mychart or phone   Make sure to check 2 hours after your medications.    AVOID these things for 30 minutes before checking your blood pressure: No Drinking caffeine. No Drinking alcohol. No Eating. No Smoking. No Exercising.   Five minutes before checking your blood pressure: Pee. Sit in a dining chair. Avoid sitting in a soft couch or armchair. Be quiet. Do not talk   Important Information About Sugar

## 2022-03-02 NOTE — Assessment & Plan Note (Signed)
Blood pressure has been somewhat borderline.  I have asked him to limit salt in his diet.  He is limited in his options for exercise due to knee pain.  We discussed alternatives for exercise.  I have asked him to monitor his blood pressure for 2 weeks and send his readings via MyChart for review.  If his pressures remain borderline elevated goal blood pressure is <130/80.  If needed, add amlodipine to his current medical regimen.  Continue indapamide 2.5 mg daily.

## 2022-03-02 NOTE — Assessment & Plan Note (Signed)
Dilated aorta noted on echocardiogram in November 2022.  Arrange follow-up echocardiogram in November 2023.  If the aorta size is increased, he will need CTA versus MRA.

## 2022-03-02 NOTE — Assessment & Plan Note (Signed)
LDL optimal on most recent lab work.  Continue current Rx.   

## 2022-03-02 NOTE — Assessment & Plan Note (Signed)
Arrange follow-up carotid US in November 2023.

## 2022-03-27 DIAGNOSIS — M1712 Unilateral primary osteoarthritis, left knee: Secondary | ICD-10-CM | POA: Insufficient documentation

## 2022-03-30 DIAGNOSIS — M170 Bilateral primary osteoarthritis of knee: Secondary | ICD-10-CM | POA: Diagnosis not present

## 2022-03-30 DIAGNOSIS — M179 Osteoarthritis of knee, unspecified: Secondary | ICD-10-CM | POA: Insufficient documentation

## 2022-03-30 DIAGNOSIS — M1712 Unilateral primary osteoarthritis, left knee: Secondary | ICD-10-CM | POA: Diagnosis not present

## 2022-03-30 DIAGNOSIS — M1711 Unilateral primary osteoarthritis, right knee: Secondary | ICD-10-CM | POA: Diagnosis not present

## 2022-04-23 DIAGNOSIS — M1712 Unilateral primary osteoarthritis, left knee: Secondary | ICD-10-CM | POA: Diagnosis not present

## 2022-04-30 DIAGNOSIS — M1712 Unilateral primary osteoarthritis, left knee: Secondary | ICD-10-CM | POA: Diagnosis not present

## 2022-04-30 DIAGNOSIS — M170 Bilateral primary osteoarthritis of knee: Secondary | ICD-10-CM | POA: Diagnosis not present

## 2022-04-30 DIAGNOSIS — M1711 Unilateral primary osteoarthritis, right knee: Secondary | ICD-10-CM | POA: Insufficient documentation

## 2022-05-27 ENCOUNTER — Other Ambulatory Visit: Payer: Self-pay | Admitting: Physician Assistant

## 2022-05-27 ENCOUNTER — Ambulatory Visit (HOSPITAL_COMMUNITY)
Admission: RE | Admit: 2022-05-27 | Discharge: 2022-05-27 | Disposition: A | Discharge status: Home / Self Care | Payer: Medicare Other | Source: Ambulatory Visit | Attending: Physician Assistant | Admitting: Physician Assistant

## 2022-05-27 ENCOUNTER — Encounter: Payer: Self-pay | Admitting: Physician Assistant

## 2022-05-27 DIAGNOSIS — I10 Essential (primary) hypertension: Secondary | ICD-10-CM

## 2022-05-27 DIAGNOSIS — I2510 Atherosclerotic heart disease of native coronary artery without angina pectoris: Secondary | ICD-10-CM | POA: Insufficient documentation

## 2022-05-27 DIAGNOSIS — I7121 Aneurysm of the ascending aorta, without rupture: Secondary | ICD-10-CM

## 2022-05-27 DIAGNOSIS — I771 Stricture of artery: Secondary | ICD-10-CM | POA: Insufficient documentation

## 2022-05-27 DIAGNOSIS — G459 Transient cerebral ischemic attack, unspecified: Secondary | ICD-10-CM

## 2022-05-27 DIAGNOSIS — I77810 Thoracic aortic ectasia: Secondary | ICD-10-CM

## 2022-05-27 DIAGNOSIS — I6523 Occlusion and stenosis of bilateral carotid arteries: Secondary | ICD-10-CM

## 2022-05-27 DIAGNOSIS — E785 Hyperlipidemia, unspecified: Secondary | ICD-10-CM

## 2022-05-27 HISTORY — DX: Stricture of artery: I77.1

## 2022-05-28 ENCOUNTER — Telehealth: Payer: Self-pay | Admitting: Cardiovascular Disease

## 2022-05-28 NOTE — Telephone Encounter (Signed)
Patient states his CT results showed a narrowing in his artery on his right arm, and he has been having weakness in his right arm. He states he is not sure if this is related and needs to be discussed.

## 2022-05-29 ENCOUNTER — Telehealth: Payer: Self-pay | Admitting: *Deleted

## 2022-05-29 DIAGNOSIS — I6523 Occlusion and stenosis of bilateral carotid arteries: Secondary | ICD-10-CM

## 2022-05-29 NOTE — Telephone Encounter (Signed)
Pt has been made aware that his appointment has been moved up to 06/03/22 11:40 with Dr. Acie Fredrickson.

## 2022-05-29 NOTE — Telephone Encounter (Signed)
Call placed to pt, left a message for pt to call back.

## 2022-05-29 NOTE — Telephone Encounter (Signed)
-----   Message from Liliane Shi, [place] sent at 05/27/2022  6:57 PM EST ----- Results sent to [name] via [place]. See MyChart comment. PLAN: -Send copy to PCP -Repeat 1 year Richardson Dopp, [place]    05/27/2022 6:53 PM

## 2022-06-03 ENCOUNTER — Ambulatory Visit: Payer: Medicare Other | Attending: Cardiovascular Disease | Admitting: Cardiovascular Disease

## 2022-06-03 ENCOUNTER — Encounter: Payer: Self-pay | Admitting: Cardiovascular Disease

## 2022-06-03 VITALS — BP 138/80 | HR 75 | Ht 70.0 in | Wt 313.8 lb

## 2022-06-03 DIAGNOSIS — G459 Transient cerebral ischemic attack, unspecified: Secondary | ICD-10-CM | POA: Diagnosis not present

## 2022-06-03 DIAGNOSIS — E66813 Obesity, class 3: Secondary | ICD-10-CM

## 2022-06-03 DIAGNOSIS — I2510 Atherosclerotic heart disease of native coronary artery without angina pectoris: Secondary | ICD-10-CM | POA: Diagnosis not present

## 2022-06-03 DIAGNOSIS — E785 Hyperlipidemia, unspecified: Secondary | ICD-10-CM

## 2022-06-03 DIAGNOSIS — I6523 Occlusion and stenosis of bilateral carotid arteries: Secondary | ICD-10-CM | POA: Diagnosis not present

## 2022-06-03 MED ORDER — CLOPIDOGREL BISULFATE 75 MG PO TABS: 75.0000 mg | ORAL_TABLET | Freq: Every day | ORAL | 3 refills | Status: DC

## 2022-06-03 NOTE — Patient Instructions (Signed)
Medication Instructions:  NO CHANGES *If you need a refill on your cardiac medications before your next appointment, please call your pharmacy*   Lab Work:  TODAY LPA LIPID AND ALT If you have labs (blood work) drawn today and your tests are completely normal, you will receive your results only by: [place] (if you have MyChart) OR A paper copy in the mail If you have any lab test that is abnormal or we need to change your treatment, we will call you to review the results.   Testing/Procedures: NONE   Follow-Up: At [facility], you and your health needs are our priority.  As part of our continuing mission to provide you with exceptional heart care, we have created designated Provider Care Teams.  These Care Teams include your primary Cardiologist (physician) and Advanced Practice Providers (APPs -  Physician Assistants and Nurse Practitioners) who all work together to provide you with the care you need, when you need it.  We recommend signing up for the patient portal called "MyChart".  Sign up information is provided on this After Visit Summary.  MyChart is used to connect with patients for Virtual Visits (Telemedicine).  Patients are able to view lab/test results, encounter notes, upcoming appointments, etc.  Non-urgent messages can be sent to your provider as well.   To learn more about what you can do with MyChart, go to [email or web address].    Your next appointment:   1 year(s)  The format for your next appointment:   In Person  Provider:   Mertie Moores, MD     Other Instructions NONE  Important Information About Sugar

## 2022-06-03 NOTE — Progress Notes (Signed)
Cardiology Office Note:    Date:  06/03/2022   ID:  Erik Wiggins, DOB 07/17/1946, MRN [id number]  PCP:  Deland Pretty, MD   [place] Providers Cardiologist:  previously Ena Dawley, MD   - Mertie Moores, MD {    Problem List  1.  Morbid obesity 2.  Hyperlipidemia 3.  Obstructive sleep apnea 4.  Hypertension 5.  TIA  Referring MD: Deland Pretty, MD   Chief Complaint  Patient presents with   Cerebrovascular Accident    History of Present Illness:    Erik Wiggins is a 76 y.o. male with a hx of coronary artery calcifications, hypertension, hyperlipidemia, morbid obesity.  He was recently hospitalized with a TIA.  He had a echocardiogram with a negative bubble study.  He was referred to the hospital by ophthalmology for concerns of a retinal artery occlusion.  He was seen by neurology.  MRI of the brain revealed chronic small vessel ischemic disease.  CTA of the brain revealed minor luminal irregularities in the right ICA and 50 to 60% stenosis of the left ICA.  The recommendation was for Plavix and aspirin for 3 weeks followed by Plavix alone and follow-up with [facility].  Addendum:  I have discussed with Dr. Leonie Man.   Patient does not need a TEE or implantable loop recorder.   Cont with 14 day event monitor.  Echocardiogram performed on May 28, 2021 reveals normal left ventricular systolic function.  He has mild left ventricular hypertrophy.  He has grade 1 diastolic dysfunction.  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  Lipids are managed by Dr. Shelia Media Lipid levels from May 28, 2021 reveals a total cholesterol of 126.  His HDL is 36.  LDL is 51.  The triglyceride level is mildly elevated at 193.  Has enrolled with [facility] Weight control   CAC score: at [facility]   IMPRESSION: The total coronary calcium score is 553 which is between the 50th and 75th percentile for a male patient this age.    No residual deficits      Nov. 15, 2023 Erik Wiggins is seen for follow up of his CVA . Carotid duplex scan from May 27, 2022 reveals moderate right internal carotid disease. Mild left ICA disease. The right subclavian artery was stenotic.  Left subclavian artery is normal.  Hx of CVA Event monitor: Event monitor showed very brief runs of nonsustained VT .  No significant arrhythmias.  No atrial fib   .  Recommendations - repeat study in 1 year   He is here to discuss the narrowing in his right subclavian artery.  L arm - 138/80 R arm - 136/72  Has noticed some right arm weakness for the past week.  Wt is 313.8  He is scheduled to have echo tomorrow  Is tolerating the rosuvastatin 20 mg tabs  We will get a lipid profile, ALT, lipoprotein a today.  Needs to cut out carbohydrates   Avoid foods that are White, wheat, sweet - white foods - limit your  rice, pasta, potatoes, corn.  if you do eat these, make sure its brown rice, whole wheat pasta  - wheat - limit intake of all bread, biscuits, pizza dough.   The bread you do eat should be whole wheat bread.  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   TTE in 11/22: 44 mm   Asthma    Asthma, mild intermittent, well-controlled 12/22/2014   CAD (coronary artery disease) 03/01/2022   CAC score in 3/22: 552 (50-75 percentile)   Carotid artery disease ([place]) 03/02/2022   Head neck CTA in November 1610: R ICA 96-04, LICA 54-09   Erectile dysfunction    High cholesterol    Hyperlipemia    Hypertension    Low testosterone    Major depressive disorder, recurrent episode, in partial remission ([place])    Metatarsalgia    Moderate persistent asthma, uncomplicated 81/19/1478   Nasal polyps 06/21/2014   Demonstrated on previous sinus CT by report. 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   Carotid US 29/11/6211: RICA 08-65, LICA 7-84, right subclavian stenosis   Umbilical hernia    Vitamin D deficiency     Past Surgical History:  Procedure Laterality Date   CARDIAC CATHETERIZATION     COLONSCOPY     EYE SURGERY     cataracts   INSERTION OF MESH N/A 02/11/2018   Procedure: INSERTION OF MESH;  Surgeon: Armandina Gemma, MD;  Location: [place];  Service: General;  Laterality: N/A;   UMBILICAL HERNIA REPAIR N/A 02/11/2018   Procedure: OPEN REPAIR UMBILICAL HERNIA;  Surgeon: Armandina Gemma, MD;  Location: [place];  Service: General;  Laterality: N/A;    Current Medications: Current Meds  Medication Sig   albuterol (PROAIR HFA) 108 (90 Base) MCG/ACT inhaler Inhale 4 puffs into the lungs every 4 (four) hours as needed for wheezing or shortness of breath. 2 puffs every 6 hrs as needed   azelastine (ASTELIN) 0.1 % nasal spray Place 1-2 sprays into both nostrils 2 (two) times daily as needed (nasal drainage). Use in each nostril as directed   cetirizine (ZYRTEC) 10 MG tablet Take 10 mg by mouth daily.   Cholecalciferol (VITAMIN D) 2000 units CAPS Take 2,000 Units by mouth 2 (two) times daily.   indapamide (LOZOL) 2.5 MG tablet Take 2.5 mg by mouth daily.    ipratropium (ATROVENT) 0.03 % nasal spray Place 1 spray into both nostrils daily as needed for rhinitis.   montelukast (SINGULAIR) 10 MG tablet Take 10 mg by mouth at bedtime.    rosuvastatin (CRESTOR) 20 MG tablet Take 1 tablet (20 mg total) by mouth daily.   sertraline (ZOLOFT) 50 MG tablet Take 50 mg by mouth daily.    Triamcinolone Acetonide (NASACORT ALLERGY 24HR NA) Place 1 spray into the nose daily as needed (congestion).   TURMERIC CURCUMIN  PO Take 1,500 mg by mouth daily.   [DISCONTINUED] clopidogrel (PLAVIX) 75 MG tablet Take 75 mg by mouth daily.     Allergies:   Lisinopril, Sulfa antibiotics, and Sulfacetamide sodium   Social History   Socioeconomic History   Marital status: Married    Spouse name: Not on file   Number of children: 3   Years of education: Not on file   Highest education level: Not on file  Occupational History   Occupation: Hotel manager  Tobacco Use   Smoking status: Former    Types: Hospital doctor  date: 07/21/1995    Years since quitting: 26.8   Smokeless tobacco: Never  Vaping Use   Vaping Use: Never used  Substance and Sexual Activity   Alcohol use: Not Currently    Comment: social   Drug use: No   Sexual activity: Not on file    Comment: MARRIED  Other Topics Concern   Not on file  Social History Narrative   Not on file   Social Determinants of Health   Financial Resource Strain: Not on file  Food Insecurity: Not on file  Transportation Needs: Not on file  Physical Activity: Not on file  Stress: Not on file  Social Connections: Not on file     Family History: The patient's family history includes AAA (abdominal aortic aneurysm) in his father; Cancer in his maternal grandfather; Healthy in his mother, son, son, and son; Heart attack (age of onset: 95) in his father; Multiple sclerosis in his son. There is no history of Allergic rhinitis, Angioedema, Asthma, Atopy, Eczema, Immunodeficiency, or Urticaria.  ROS:   Please see the history of present illness.     All other systems reviewed and are negative.  EKGs/Labs/Other Studies Reviewed:    The following studies were reviewed today:   EKG:   June 03, 2022: Normal sinus rhythm at 75.  Nonspecific T wave abnormality.  He has significant muscle artifact.  Recent Labs: No results found for requested labs within last 365 days.  Recent Lipid Panel    Component Value Date/Time   CHOL 126 05/28/2021 0334    TRIG 193 (H) 05/28/2021 0334   HDL 36 (L) 05/28/2021 0334   CHOLHDL 3.5 05/28/2021 0334   VLDL 39 05/28/2021 0334   LDLCALC 51 05/28/2021 0334     Risk Assessment/Calculations:           Physical Exam:     Physical Exam: Blood pressure 138/80, pulse 75, height '5\' 10"'$  (1.778 m), weight (!) 313 lb 12.8 oz (142.3 kg), SpO2 96 %.       GEN:  Well nourished, well developed in no acute distress HEENT: Normal NECK: No JVD; No carotid bruits LYMPHATICS: No lymphadenopathy CARDIAC: RRR , no murmurs, rubs, gallops RESPIRATORY:  Clear to auscultation without rales, wheezing or rhonchi  ABDOMEN: Soft, non-tender, non-distended MUSCULOSKELETAL:  No edema; No deformity . Arm pulses are 3+ and equal bilaterlly ,  leg pulses are 3+  SKIN: Warm and dry NEUROLOGIC:  Alert and oriented x 3   ASSESSMENT:    1. Hyperlipidemia, unspecified hyperlipidemia type   2. TIA (transient ischemic attack)   3. Coronary artery disease involving native coronary artery of native heart without angina pectoris   4. Obesity, Class III, BMI 40-49.9 (morbid obesity) ([place])   5. Hyperlipidemia LDL goal <70     PLAN:     TIA :  workup  by by neuro.   There is no mention of needing a TEE .   Will check with Dr. Leonie Man. Event monitor showed very brief runs of nonsustained VT .  No significant arrhythmias.  No atrial fib   .     2. Morbid obesity -   advised weight loss     3.  HTN: BP is well controlled   4.  Hyperlipidemia :   LDL is fairly well controlled as of labs from last year  Recheck today  Check LP(a)  Advised weight loss         Medication Adjustments/Labs and Tests Ordered: Current medicines are  reviewed at length with the patient today.  Concerns regarding medicines are outlined above.  Orders Placed This Encounter  Procedures   Lipoprotein A (LPA)   Lipid panel   ALT   EKG 12-Lead    Meds ordered this encounter  Medications   clopidogrel (PLAVIX) 75 MG tablet    Sig:  Take 1 tablet (75 mg total) by mouth daily.    Dispense:  90 tablet    Refill:  3     Patient Instructions  Medication Instructions:  NO CHANGES *If you need a refill on your cardiac medications before your next appointment, please call your pharmacy*   Lab Work:  TODAY LPA LIPID AND ALT If you have labs (blood work) drawn today and your tests are completely normal, you will receive your results only by: [place] (if you have MyChart) OR A paper copy in the mail If you have any lab test that is abnormal or we need to change your treatment, we will call you to review the results.   Testing/Procedures: NONE   Follow-Up: At [facility], you and your health needs are our priority.  As part of our continuing mission to provide you with exceptional heart care, we have created designated Provider Care Teams.  These Care Teams include your primary Cardiologist (physician) and Advanced Practice Providers (APPs -  Physician Assistants and Nurse Practitioners) who all work together to provide you with the care you need, when you need it.  We recommend signing up for the patient portal called "MyChart".  Sign up information is provided on this After Visit Summary.  MyChart is used to connect with patients for Virtual Visits (Telemedicine).  Patients are able to view lab/test results, encounter notes, upcoming appointments, etc.  Non-urgent messages can be sent to your provider as well.   To learn more about what you can do with MyChart, go to [email or web address].    Your next appointment:   1 year(s)  The format for your next appointment:   In Person  Provider:   Mertie Moores, MD     Other Instructions NONE  Important Information About Sugar         Signed, Mertie Moores, MD  06/03/2022 12:03 PM    [place]

## 2022-06-04 ENCOUNTER — Ambulatory Visit (HOSPITAL_COMMUNITY): Payer: Medicare Other | Attending: Physician Assistant

## 2022-06-04 ENCOUNTER — Telehealth: Payer: Self-pay | Admitting: *Deleted

## 2022-06-04 DIAGNOSIS — I77810 Thoracic aortic ectasia: Secondary | ICD-10-CM | POA: Diagnosis not present

## 2022-06-04 DIAGNOSIS — I7121 Aneurysm of the ascending aorta, without rupture: Secondary | ICD-10-CM | POA: Insufficient documentation

## 2022-06-04 LAB — LIPID PANEL
Chol/HDL Ratio: 3.1 ratio (ref 0.0–5.0)
Cholesterol, Total: 122 mg/dL (ref 100–199)
HDL: 40 mg/dL (ref >39)
LDL Chol Calc (NIH): 45 mg/dL (ref 0–99)
Triglycerides: 232 mg/dL — ABNORMAL HIGH (ref 0–149)
VLDL Cholesterol Cal: 37 mg/dL (ref 5–40)

## 2022-06-04 LAB — ALT: ALT: 11 IU/L (ref 0–44)

## 2022-06-04 LAB — ECHOCARDIOGRAM COMPLETE
Area-P 1/2: 2.76 cm2
S' Lateral: 2.4 cm

## 2022-06-04 LAB — LIPOPROTEIN A (LPA): Lipoprotein (a): 183.1 nmol/L — ABNORMAL HIGH (ref <75.0)

## 2022-06-04 NOTE — Telephone Encounter (Signed)
-----   Message from Liliane Shi, [place] sent at 06/04/2022  4:21 PM EST ----- Results sent to [name] via [place]. See MyChart comment. PLAN: -Send copy to PCP -Repeat echocardiogram 1 year (diagnosis: Dilated ascending aorta) Richardson Dopp, PA-C    06/04/2022 4:20 PM

## 2022-06-10 ENCOUNTER — Telehealth: Payer: Self-pay | Admitting: Cardiovascular Disease

## 2022-06-10 DIAGNOSIS — E785 Hyperlipidemia, unspecified: Secondary | ICD-10-CM

## 2022-06-10 DIAGNOSIS — Z79899 Other long term (current) drug therapy: Secondary | ICD-10-CM

## 2022-06-10 MED ORDER — EZETIMIBE 10 MG PO TABS: 10.0000 mg | ORAL_TABLET | Freq: Every day | ORAL | 3 refills | Status: DC

## 2022-06-10 NOTE — Telephone Encounter (Signed)
-----   Message from Thayer Headings, MD sent at 06/09/2022  6:02 PM EST ----- Please add Zetia 10 mg a day  Check lipids, ALT in 3 months  Have him work on diet, exercise, weight loss  PN  ----- Message ----- From: Ramond Dial, RPH-CPP Sent: 06/09/2022   8:46 AM EST To: Thayer Headings, MD; Donnalee Curry K  Wouldn't be able to get PCSK9i since LDL is already <55, but I think you could add ezetimibe '10mg'$  daily. He is already on rosuvastatin '20mg'$ . ----- Message ----- From: Thayer Headings, MD Sent: 06/05/2022   8:27 AM EST To: Ramond Dial, RPH-CPP; Donnalee Curry K  Lp(a) is elevated at 183. His LDL is at goal so I doubt he would qualify for a PCSK9 inhibitor ( I have sent this message to our lipid clinic for conformation)  For now lets start Rosuvastatin 10 mg a day  ALT , BMP in 3 months

## 2022-06-10 NOTE — Telephone Encounter (Signed)
Called and spoke with patient who agrees to start zetia and do repeat labs. Labs entered and scheduled for 09/11/22.

## 2022-06-23 DIAGNOSIS — H04123 Dry eye syndrome of bilateral lacrimal glands: Secondary | ICD-10-CM | POA: Diagnosis not present

## 2022-06-23 DIAGNOSIS — H40033 Anatomical narrow angle, bilateral: Secondary | ICD-10-CM | POA: Diagnosis not present

## 2022-07-02 DIAGNOSIS — M1711 Unilateral primary osteoarthritis, right knee: Secondary | ICD-10-CM | POA: Diagnosis not present

## 2022-07-03 DIAGNOSIS — H53411 Scotoma involving central area, right eye: Secondary | ICD-10-CM | POA: Diagnosis not present

## 2022-07-03 DIAGNOSIS — H348312 Tributary (branch) retinal vein occlusion, right eye, stable: Secondary | ICD-10-CM | POA: Diagnosis not present

## 2022-07-03 DIAGNOSIS — D3131 Benign neoplasm of right choroid: Secondary | ICD-10-CM | POA: Diagnosis not present

## 2022-07-03 DIAGNOSIS — H35373 Puckering of macula, bilateral: Secondary | ICD-10-CM | POA: Diagnosis not present

## 2022-07-06 ENCOUNTER — Ambulatory Visit: Payer: Medicare Other | Admitting: Cardiovascular Disease

## 2022-07-09 DIAGNOSIS — M1711 Unilateral primary osteoarthritis, right knee: Secondary | ICD-10-CM | POA: Diagnosis not present

## 2022-07-14 ENCOUNTER — Encounter: Payer: Self-pay | Admitting: Neurology

## 2022-07-14 ENCOUNTER — Ambulatory Visit (INDEPENDENT_AMBULATORY_CARE_PROVIDER_SITE_OTHER): Payer: Medicare Other | Admitting: Neurology

## 2022-07-14 VITALS — BP 151/75 | HR 75 | Ht 70.0 in | Wt 315.4 lb

## 2022-07-14 DIAGNOSIS — E785 Hyperlipidemia, unspecified: Secondary | ICD-10-CM | POA: Diagnosis not present

## 2022-07-14 DIAGNOSIS — Z8669 Personal history of other diseases of the nervous system and sense organs: Secondary | ICD-10-CM | POA: Diagnosis not present

## 2022-07-14 DIAGNOSIS — I6523 Occlusion and stenosis of bilateral carotid arteries: Secondary | ICD-10-CM | POA: Diagnosis not present

## 2022-07-14 NOTE — Patient Instructions (Signed)
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 I also advised the patient to eat a healthy diet with plenty of whole grains, cereals, fruits and vegetables, exercise regularly and maintain ideal body weight I also counseled the patient to use a CPAP every night for his sleep apnea.  Followup in the future with me in 1 year or call earlier if necessary.  Stroke Prevention Some medical conditions and behaviors can lead to a higher chance of having a stroke. You can help prevent a stroke by eating healthy, exercising, not smoking, and managing any medical conditions you have. Stroke is a leading cause of functional impairment. Primary prevention is particularly important because a majority of strokes are first-time events. Stroke changes the lives of not only those who experience a stroke but also their family and other caregivers. How can this condition affect me? A stroke is a medical emergency and should be treated right away. A stroke can lead to brain damage and can sometimes be life-threatening. If a person gets medical treatment right away, there is a better chance of surviving and recovering from a stroke. What can increase my risk? The following medical conditions may increase your risk of a stroke: Cardiovascular disease. High blood pressure (hypertension). Diabetes. High cholesterol. Sickle cell disease. Blood clotting disorders (hypercoagulable state). Obesity. Sleep disorders (obstructive sleep apnea). Other risk factors  include: Being older than age 77. Having a history of blood clots, stroke, or mini-stroke (transient ischemic attack, TIA). Genetic factors, such as race, ethnicity, or a family history of stroke. Smoking cigarettes or using other tobacco products. Taking birth control pills, especially if you also use tobacco. Heavy use of alcohol or drugs, especially cocaine and methamphetamine. Physical inactivity. What actions can I take to prevent this? Manage your health conditions High cholesterol levels. Eating a healthy diet is important for preventing high cholesterol. If cholesterol cannot be managed through diet alone, you may need to take medicines. Take any prescribed medicines to control your cholesterol as told by your health care provider. Hypertension. To reduce your risk of stroke, try to keep your blood pressure below 130/80. Eating a healthy diet and exercising regularly are important for controlling blood pressure. If these steps are not enough to manage your blood pressure, you may need to take medicines. Take any prescribed medicines to control hypertension as told by your health care provider. Ask your health care provider if you should monitor your blood pressure at home. Have your blood pressure checked every year, even if your blood pressure is normal. Blood pressure increases with age and some medical conditions. Diabetes. Eating a healthy diet and exercising regularly are important parts of managing your blood sugar (glucose). If your blood sugar cannot be managed through diet and exercise, you may need to take medicines. Take any prescribed medicines to control your diabetes as told by your health care provider. Get evaluated for obstructive sleep apnea. Talk to your health care provider about getting a sleep evaluation if you snore a lot or have excessive sleepiness. Make sure that any other medical conditions you have, such as atrial fibrillation or  atherosclerosis, are  managed. Nutrition Follow instructions from your health care provider about what to eat or drink to help manage your health condition. These instructions may include: Reducing your daily calorie intake. Limiting how much salt (sodium) you use to 1,500 milligrams (mg) each day. Using only healthy fats for cooking, such as olive oil, canola oil, or sunflower oil. Eating healthy foods. You can do this by: Choosing foods that are high in fiber, such as whole grains, and fresh fruits and vegetables. Eating at least 5 servings of fruits and vegetables a day. Try to fill one-half of your plate with fruits and vegetables at each meal. Choosing lean protein foods, such as lean cuts of meat, poultry without skin, fish, tofu, beans, and nuts. Eating low-fat dairy products. Avoiding foods that are high in sodium. This can help lower blood pressure. Avoiding foods that have saturated fat, trans fat, and cholesterol. This can help prevent high cholesterol. Avoiding processed and prepared foods. Counting your daily carbohydrate intake.  Lifestyle If you drink alcohol: Limit how much you have to: 0-1 drink a day for women who are not pregnant. 0-2 drinks a day for men. Know how much alcohol is in your drink. In the [place] one drink equals one 12 oz bottle of beer (37m), one 5 oz glass of wine (1468m, or one 1 oz glass of hard liquor (4419m Do not use any products that contain nicotine or tobacco. These products include cigarettes, chewing tobacco, and vaping devices, such as e-cigarettes. If you need help quitting, ask your health care provider. Avoid secondhand smoke. Do not use drugs. Activity  Try to stay at a healthy weight. Get at least 30 minutes of exercise on most days, such as: Fast walking. Biking. Swimming. Medicines Take over-the-counter and prescription medicines only as told by your health care provider. Aspirin or blood thinners (antiplatelets or anticoagulants) may be recommended  to reduce your risk of forming blood clots that can lead to stroke. Avoid taking birth control pills. Talk to your health care provider about the risks of taking birth control pills if: You are over 35 25ars old. You smoke. You get very bad headaches. You have had a blood clot. Where to find more information American Stroke Association: www.strokeassociation.org Get help right away if: You or a loved one has any symptoms of a stroke. "BE FAST" is an easy way to remember the main warning signs of a stroke: B - Balance. Signs are dizziness, sudden trouble walking, or loss of balance. E - Eyes. Signs are trouble seeing or a sudden change in vision. F - Face. Signs are sudden weakness or numbness of the face, or the face or eyelid drooping on one side. A - Arms. Signs are weakness or numbness in an arm. This happens suddenly and usually on one side of the body. S - Speech. Signs are sudden trouble speaking, slurred speech, or trouble understanding what people say. T - Time. Time to call emergency services. Write down what time symptoms started. You or a loved one has other signs of a stroke, such as: A sudden, severe headache with no known cause. Nausea or vomiting. Seizure. These symptoms may represent a serious problem that is an emergency. Do not wait to see if the symptoms will go away. Get medical help right away. Call your local emergency services (911 in the [place] Do not drive yourself to the hospital. Summary You can help to prevent a stroke by eating healthy, exercising, not smoking, limiting alcohol  intake, and managing any medical conditions you may have. Do not use any products that contain nicotine or tobacco. These include cigarettes, chewing tobacco, and vaping devices, such as e-cigarettes. If you need help quitting, ask your health care provider. Remember "BE FAST" for warning signs of a stroke. Get help right away if you or a loved one has any of these signs. This information  is not intended to replace advice given to you by your health care provider. Make sure you discuss any questions you have with your health care provider. Document Revised: 02/05/2020 Document Reviewed: 02/05/2020 Elsevier Patient Education  [place].

## 2022-07-14 NOTE — Progress Notes (Signed)
[facility] [address] [place]. [place] [address] [phone]       OFFICE FOLLOW-UP NOTE  [name] Date of Birth:  Dec 27, 1945 Medical Record Number:  [id number]   HPI: Initial visit 07/29/2021 Erik Wiggins is 76 year old Caucasian male seen today for first office follow-up visit following hospital admission for stroke in November 2022.  History is obtained from the patient and review of electronic medical records and opossum reviewed available pertinent imaging from in PACS.  He has past medical history of hypertension, hyperlipidemia, obesity, obstructive sleep apnea on CPAP and asthma.  He presented on 05/27/2021 with sudden onset of right eye inferior nasal visual field defect.  He got up in the morning and was fine and while driving down the road a few hours later he noticed sudden onset of vision loss with a dark spot in the right eye in the lower nasal visual field.  He initially thought this was a sunglasses and tried to remove his glasses but this spot persisted.  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  He was referred to the ED for urgent evaluation.  He presented outside window for IV thrombolysis.  His work-up included an MRI scan of the brain as well as orbits which were both normal.  CT angiogram of the head and neck was obtained which showed subtle intimal irregularity of the horizontal petrous portion of the right internal carotid artery suspicious for atheroma versus focal dissection.  Hemoglobin A1c was 6.0.  Urine drug screen was negative.  LDL cholesterol was optimal at 51 mg percent.  Echocardiogram showed ejection fraction of 60 to 65% without cardiac source of embolism.  Telemetry monitoring during the hospitalization followed by outpatient 1 week of cardiac monitoring revealed no evidence of atrial fibrillation and only very few brief episodes of nonsustained SVT.  Patient was  started on aspirin and Plavix for 3 weeks followed by Plavix alone which is tolerating well without bleeding or bruising.  His change.  Vision loss still persists and is unchanged.  He is started eating healthy and has joined [facility] program and lost 10 pounds.  He also exercise regularly and goes to the gym.  He is has been able to drive but has to be careful.  He has no new complaints.  He has been compliant with using his CPAP every night.  His blood pressure is under good control today it is 131/69.  He has no prior history of strokes TIAs or significant neurological problems. Update 07/14/2022 ; he returns for follow-up after last visit 11 months ago.  He states he is doing well.  No further episodes of vision loss or other stroke or TIA symptoms.  Continues to have right eye inferior quadrant vision loss which is unchanged from last visit.  No new complaints.  He is tolerating Plavix well without bruising or bleeding.  He does use his CPAP every night without fail for sleep apnea.  He has not had side effects .  He has had diarrhea stools 5-6 times per day for the last 2 to 3 months but has not yet discussed it with his primary physician but plans to do so soon.  Carotid ultrasound on 05/27/2022 showed 40 to 59% right ICA and 1-39% left ICA stenosis.  Lab work on 06/03/2022 showed LDL cholesterol to be optimal at 45 mg percent however triglycerides were elevated  at 232.  Echocardiogram on 06/04/2022 showed ejection fraction of 60 with mild left ventricular hypertrophy.  Mild left atrial dilatation.  No new neurological complaints today.  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   TTE in 11/22: 44 mm   Asthma    Asthma, mild intermittent, well-controlled 12/22/2014    CAD (coronary artery disease) 03/01/2022   CAC score in 3/22: 552 (50-75 percentile)   Carotid artery disease ([place]) 03/02/2022   Head neck CTA in November 7209: R ICA 47-09, LICA 62-83   Erectile dysfunction    High cholesterol    Hyperlipemia    Hypertension    Low testosterone    Major depressive disorder, recurrent episode, in partial remission ([place])    Metatarsalgia    Moderate persistent asthma, uncomplicated 66/29/4765   Nasal polyps 06/21/2014   Demonstrated on previous sinus CT by report. 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/2022   Carotid US 46/11/352: RICA 65-68, LICA 1-27, right subclavian stenosis   Umbilical hernia    Vitamin D deficiency     Social History:  Social History   Socioeconomic History   Marital status: Married    Spouse name: Not on file   Number of children: 3   Years of education: Not on file   Highest education level: Not on file  Occupational History   Occupation: Hotel manager  Tobacco Use   Smoking status: Former    Types: Cigars    Quit date: 07/21/1995    Years since quitting: 27.0   Smokeless tobacco: Never  Vaping Use   Vaping Use: Never used  Substance and Sexual Activity   Alcohol use: Not Currently    Comment: social   Drug use: No   Sexual activity: Not on file    Comment: MARRIED  Other Topics Concern   Not on file  Social History Narrative   Not on file   Social Determinants of Health   Financial Resource Strain: Not on file  Food Insecurity: Not on file  Transportation Needs: Not on file  Physical Activity: Not on file  Stress: Not on file  Social Connections: Not on file  Intimate Partner Violence: Not on file    Medications:   Current Outpatient Medications on File  Prior to Visit  Medication Sig Dispense Refill   albuterol (PROAIR HFA) 108 (90 Base) MCG/ACT inhaler Inhale 4 puffs into the lungs every 4 (four) hours as needed for wheezing or shortness of breath. 2 puffs every 6 hrs as needed 1 Inhaler 2   azelastine (ASTELIN) 0.1 % nasal spray Place 1-2 sprays into both nostrils 2 (two) times daily as needed (nasal drainage). Use in each nostril as directed 30 mL 0   cetirizine (ZYRTEC) 10 MG tablet Take 10 mg by mouth daily.     Cholecalciferol (VITAMIN D) 2000 units CAPS Take 2,000 Units by mouth 2 (two) times daily.     clopidogrel (PLAVIX) 75 MG tablet Take 1 tablet (75 mg total) by mouth daily. 90 tablet  3   ezetimibe (ZETIA) 10 MG tablet Take 1 tablet (10 mg total) by mouth daily. 90 tablet 3   indapamide (LOZOL) 2.5 MG tablet Take 2.5 mg by mouth daily.      ipratropium (ATROVENT) 0.03 % nasal spray Place 1 spray into both nostrils daily as needed for rhinitis.  6   montelukast (SINGULAIR) 10 MG tablet Take 10 mg by mouth at bedtime.      rosuvastatin (CRESTOR) 20 MG tablet Take 1 tablet (20 mg total) by mouth daily. 90 tablet 3   sertraline (ZOLOFT) 50 MG tablet Take 50 mg by mouth daily.      Triamcinolone Acetonide (NASACORT ALLERGY 24HR NA) Place 1 spray into the nose daily as needed (congestion).     TURMERIC CURCUMIN PO Take 1,500 mg by mouth daily.     No current facility-administered medications on file prior to visit.    Allergies:   Allergies  Allergen Reactions   Lisinopril Swelling and Other (See Comments)    Swollen lips   Sulfa Antibiotics Hives   Sulfacetamide Sodium Other (See Comments)    Physical Exam General: Obese elderly Caucasian male, seated, in no evident distress Head: head normocephalic and atraumatic.  Neck: supple with no carotid or supraclavicular bruits Cardiovascular: regular rate and rhythm, no murmurs Musculoskeletal: no deformity Skin:  no rash/petichiae Vascular:  Normal pulses all extremities Vitals:    07/14/22 1443  BP: (!) 151/75  Pulse: 75   Neurologic Exam Mental Status: Awake and fully alert. Oriented to place and time. Recent and remote memory intact. Attention span, concentration and fund of knowledge appropriate. Mood and affect appropriate.  Cranial Nerves: Fundoscopic exam not done. Pupils equal, briskly reactive to light.  Right eye inferior nasal visual field defect.  Extraocular movements full without nystagmus. Marland Kitchen Hearing intact. Facial sensation intact. Face, tongue, palate moves normally and symmetrically.  Motor: Normal bulk and tone. Normal strength in all tested extremity muscles. Sensory.: intact to touch ,pinprick .position and vibratory sensation.  Coordination: Rapid alternating movements normal in all extremities. Finger-to-nose and heel-to-shin performed accurately bilaterally. Gait and Station: Arises from chair without difficulty. Stance is normal.  Uses a cane for ambulation.  Gait demonstrates normal stride length and balance .  Not able to heel, toe and tandem walk without difficulty.  Reflexes: 1+ and symmetric. Toes downgoing.       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  Vascular risk factors of hyperlipidemia, obesity, sleep apnea and atherosclerosis.     PLAN:I had a long d/w patient about his  right eye partial vision loss due to retinal artery branch occlusion, intracranial atherosclerosis, risk for recurrent stroke/TIAs, personally independently reviewed imaging studies and stroke evaluation results and answered questions.Continue Plavix 75 mg daily  for secondary stroke prevention and maintain strict control of hypertension with blood pressure goal below 130/90, diabetes with hemoglobin A1c goal below 6.5% and lipids with LDL cholesterol goal below 70  mg/dL. I also advised the patient to eat a healthy diet with plenty of whole grains, cereals, fruits and vegetables, exercise regularly and maintain ideal body weight I also counseled the patient to use a CPAP every night for his sleep apnea.  Followup in the future with me in 1 year or call earlier if necessary..Greater than 50% of time during this 35 minute visit was spent on  counseling,explanation of diagnosis of retinal artery branch occlusion ,stroke risk, planning of further management, discussion with patient and family and coordination of care Antony Contras, MD Note: This document was prepared with digital dictation and possible smart phrase technology. Any transcriptional errors that result from this process are unintentional

## 2022-08-05 DIAGNOSIS — H34231 Retinal artery branch occlusion, right eye: Secondary | ICD-10-CM | POA: Diagnosis not present

## 2022-08-05 DIAGNOSIS — H40023 Open angle with borderline findings, high risk, bilateral: Secondary | ICD-10-CM | POA: Diagnosis not present

## 2022-08-05 DIAGNOSIS — L909 Atrophic disorder of skin, unspecified: Secondary | ICD-10-CM | POA: Diagnosis not present

## 2022-09-02 DIAGNOSIS — H16001 Unspecified corneal ulcer, right eye: Secondary | ICD-10-CM | POA: Diagnosis not present

## 2022-09-02 DIAGNOSIS — H01023 Squamous blepharitis right eye, unspecified eyelid: Secondary | ICD-10-CM | POA: Diagnosis not present

## 2022-09-02 DIAGNOSIS — H01026 Squamous blepharitis left eye, unspecified eyelid: Secondary | ICD-10-CM | POA: Diagnosis not present

## 2022-09-03 DIAGNOSIS — H01023 Squamous blepharitis right eye, unspecified eyelid: Secondary | ICD-10-CM | POA: Diagnosis not present

## 2022-09-03 DIAGNOSIS — H16001 Unspecified corneal ulcer, right eye: Secondary | ICD-10-CM | POA: Diagnosis not present

## 2022-09-03 DIAGNOSIS — H01026 Squamous blepharitis left eye, unspecified eyelid: Secondary | ICD-10-CM | POA: Diagnosis not present

## 2022-09-07 DIAGNOSIS — H8110 Benign paroxysmal vertigo, unspecified ear: Secondary | ICD-10-CM | POA: Diagnosis not present

## 2022-09-07 DIAGNOSIS — R030 Elevated blood-pressure reading, without diagnosis of hypertension: Secondary | ICD-10-CM | POA: Diagnosis not present

## 2022-09-08 DIAGNOSIS — R42 Dizziness and giddiness: Secondary | ICD-10-CM | POA: Diagnosis not present

## 2022-09-10 ENCOUNTER — Ambulatory Visit: Payer: Medicare Other | Attending: Cardiovascular Disease

## 2022-09-10 DIAGNOSIS — I10 Essential (primary) hypertension: Secondary | ICD-10-CM | POA: Diagnosis not present

## 2022-09-10 DIAGNOSIS — E785 Hyperlipidemia, unspecified: Secondary | ICD-10-CM

## 2022-09-10 DIAGNOSIS — Z79899 Other long term (current) drug therapy: Secondary | ICD-10-CM

## 2022-09-10 DIAGNOSIS — Z125 Encounter for screening for malignant neoplasm of prostate: Secondary | ICD-10-CM | POA: Diagnosis not present

## 2022-09-10 LAB — LAB REPORT - SCANNED
Creatinine, POC: 0.99 mg/dL
PSA, FREE: 0.29

## 2022-09-10 LAB — ALT: ALT: 14 IU/L (ref 0–44)

## 2022-09-10 LAB — LIPID PANEL
Chol/HDL Ratio: 2.7 ratio (ref 0.0–5.0)
Cholesterol, Total: 125 mg/dL (ref 100–199)
HDL: 47 mg/dL (ref >39)
LDL Chol Calc (NIH): 51 mg/dL (ref 0–99)
Triglycerides: 160 mg/dL — ABNORMAL HIGH (ref 0–149)
VLDL Cholesterol Cal: 27 mg/dL (ref 5–40)

## 2022-09-17 DIAGNOSIS — J309 Allergic rhinitis, unspecified: Secondary | ICD-10-CM | POA: Diagnosis not present

## 2022-09-17 DIAGNOSIS — H8110 Benign paroxysmal vertigo, unspecified ear: Secondary | ICD-10-CM | POA: Diagnosis not present

## 2022-09-17 DIAGNOSIS — M8580 Other specified disorders of bone density and structure, unspecified site: Secondary | ICD-10-CM | POA: Diagnosis not present

## 2022-09-17 DIAGNOSIS — Z0000 Encounter for general adult medical examination without abnormal findings: Secondary | ICD-10-CM | POA: Diagnosis not present

## 2022-09-17 DIAGNOSIS — I6529 Occlusion and stenosis of unspecified carotid artery: Secondary | ICD-10-CM | POA: Diagnosis not present

## 2022-09-17 DIAGNOSIS — F339 Major depressive disorder, recurrent, unspecified: Secondary | ICD-10-CM | POA: Diagnosis not present

## 2022-09-17 DIAGNOSIS — I10 Essential (primary) hypertension: Secondary | ICD-10-CM | POA: Diagnosis not present

## 2022-09-17 DIAGNOSIS — G4733 Obstructive sleep apnea (adult) (pediatric): Secondary | ICD-10-CM | POA: Diagnosis not present

## 2022-09-17 DIAGNOSIS — I2510 Atherosclerotic heart disease of native coronary artery without angina pectoris: Secondary | ICD-10-CM | POA: Diagnosis not present

## 2022-09-17 DIAGNOSIS — I700 Atherosclerosis of aorta: Secondary | ICD-10-CM | POA: Diagnosis not present

## 2022-09-17 DIAGNOSIS — J45909 Unspecified asthma, uncomplicated: Secondary | ICD-10-CM | POA: Diagnosis not present

## 2022-09-21 DIAGNOSIS — R42 Dizziness and giddiness: Secondary | ICD-10-CM | POA: Diagnosis not present

## 2022-09-23 ENCOUNTER — Telehealth: Payer: Self-pay

## 2022-09-23 NOTE — Patient Outreach (Signed)
  Care Coordination   09/23/2022 Name: THADD MAUK MRN: [id number] DOB: 31-Oct-1945   Care Coordination Outreach Attempts:  An unsuccessful telephone outreach was attempted today to offer the patient information about available care coordination services as a benefit of their health plan.   Follow Up Plan:  Additional outreach attempts will be made to offer the patient care coordination information and services.   Encounter Outcome:  No Answer   Care Coordination Interventions:  No, not indicated    Jone Baseman, RN, MSN [facility] Care Management Coordinator Direct Line [phone]

## 2022-09-25 DIAGNOSIS — H01026 Squamous blepharitis left eye, unspecified eyelid: Secondary | ICD-10-CM | POA: Diagnosis not present

## 2022-09-25 DIAGNOSIS — H18832 Recurrent erosion of cornea, left eye: Secondary | ICD-10-CM | POA: Diagnosis not present

## 2022-09-25 DIAGNOSIS — H01023 Squamous blepharitis right eye, unspecified eyelid: Secondary | ICD-10-CM | POA: Diagnosis not present

## 2022-09-28 DIAGNOSIS — H18832 Recurrent erosion of cornea, left eye: Secondary | ICD-10-CM | POA: Diagnosis not present

## 2022-10-05 ENCOUNTER — Encounter: Payer: Self-pay | Admitting: Internal Medicine

## 2022-10-06 ENCOUNTER — Other Ambulatory Visit: Payer: Self-pay

## 2022-10-06 DIAGNOSIS — Z87891 Personal history of nicotine dependence: Secondary | ICD-10-CM | POA: Diagnosis not present

## 2022-10-06 DIAGNOSIS — I10 Essential (primary) hypertension: Secondary | ICD-10-CM | POA: Insufficient documentation

## 2022-10-06 DIAGNOSIS — S0502XA Injury of conjunctiva and corneal abrasion without foreign body, left eye, initial encounter: Secondary | ICD-10-CM | POA: Diagnosis not present

## 2022-10-06 DIAGNOSIS — J45909 Unspecified asthma, uncomplicated: Secondary | ICD-10-CM | POA: Insufficient documentation

## 2022-10-06 DIAGNOSIS — I2510 Atherosclerotic heart disease of native coronary artery without angina pectoris: Secondary | ICD-10-CM | POA: Diagnosis not present

## 2022-10-06 DIAGNOSIS — X58XXXA Exposure to other specified factors, initial encounter: Secondary | ICD-10-CM | POA: Diagnosis not present

## 2022-10-06 DIAGNOSIS — H9202 Otalgia, left ear: Secondary | ICD-10-CM | POA: Diagnosis present

## 2022-10-06 NOTE — ED Triage Notes (Signed)
Reports L eye pain x "a couple of hours" States, "it feels like theres something in it". Went to the eye doctor and was told he had a scratch and they gave him medication 2-3 weeks ago and it had resolved. Admits blurred vision s/p putting drops in eye.

## 2022-10-07 ENCOUNTER — Emergency Department (HOSPITAL_BASED_OUTPATIENT_CLINIC_OR_DEPARTMENT_OTHER)
Admission: EM | Admit: 2022-10-07 | Discharge: 2022-10-07 | Disposition: A | Discharge status: Home / Self Care | Payer: Medicare Other | Attending: Emergency Medicine | Admitting: Emergency Medicine

## 2022-10-07 DIAGNOSIS — S0502XA Injury of conjunctiva and corneal abrasion without foreign body, left eye, initial encounter: Secondary | ICD-10-CM

## 2022-10-07 MED ORDER — ERYTHROMYCIN 5 MG/GM OP OINT: 1.0000 | TOPICAL_OINTMENT | Freq: Four times a day (QID) | OPHTHALMIC | 0 refills | Status: AC

## 2022-10-07 MED ORDER — TETRACAINE HCL 0.5 % OP SOLN
2.0000 [drp] | Freq: Once | OPHTHALMIC | Status: AC
  Administered 2022-10-07: 2 [drp] via OPHTHALMIC
  Filled 2022-10-07: qty 4

## 2022-10-07 MED ORDER — FLUORESCEIN SODIUM 1 MG OP STRP
1.0000 | ORAL_STRIP | Freq: Once | OPHTHALMIC | Status: AC
  Administered 2022-10-07: 1 via OPHTHALMIC
  Filled 2022-10-07: qty 1

## 2022-10-07 NOTE — Discharge Instructions (Signed)
You were evaluated in the Emergency Department and after careful evaluation, we did not find any emergent condition requiring admission or further testing in the hospital.  Your exam/testing today is overall reassuring.  Symptoms seem to be due to a corneal abrasion.  Take the Romycin ointment as directed.  Follow-up with Dr. Katy Fitch within the next week.  Please return to the Emergency Department if you experience any worsening of your condition.   Thank you for allowing [place] to be a part of your care.

## 2022-10-07 NOTE — ED Provider Notes (Signed)
[facility] Emergency Department Provider Note MRN:  [id number]  Arrival date & time: 10/07/22     Chief Complaint   Eye Pain   History of Present Illness   Erik Wiggins is a 77 y.o. year-old male with a history of CAD, OSA presenting to the ED with chief complaint of eye pain.  Pain to the left eye.  Feels similar to prior episodes of corneal abrasion.  He explains that he has to wear CPAP at night and he thinks this dries out his eyes and causes some tearing and crusting and his ophthalmologist thinks maybe that the crusting causes recurrent corneal abrasions.  Had one 2 weeks ago and was treated with erythromycin ointment.  Discomfort is returned this evening.  No vision loss, no other complaints.  Review of Systems  A thorough review of systems was obtained and all systems are negative except as noted in the HPI and PMH.   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   TTE in 11/22: 44 mm   Asthma    Asthma, mild intermittent, well-controlled 12/22/2014   CAD (coronary artery disease) 03/01/2022   CAC score in 3/22: 552 (50-75 percentile)   Carotid artery disease ([place]) 03/02/2022   Head neck CTA in November 123456: R ICA 123XX123, LICA XX123456   Erectile dysfunction    High cholesterol    Hyperlipemia    Hypertension    Low testosterone    Major depressive disorder, recurrent episode, in partial remission ([place])    Metatarsalgia    Moderate persistent asthma, uncomplicated 0000000   Nasal polyps 06/21/2014   Demonstrated on previous sinus CT by report. 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/2022   Carotid US  [id number]: RICA 123XX123, LICA XX123456, right subclavian stenosis   Umbilical hernia    Vitamin D deficiency     Past Surgical History:  Procedure Laterality Date   CARDIAC CATHETERIZATION     COLONSCOPY     EYE SURGERY     cataracts   INSERTION OF MESH N/A 02/11/2018   Procedure: INSERTION OF MESH;  Surgeon: Armandina Gemma, MD;  Location: [facility];  Service: General;  Laterality: N/A;   UMBILICAL HERNIA REPAIR N/A 02/11/2018   Procedure: OPEN REPAIR UMBILICAL HERNIA;  Surgeon: Armandina Gemma, MD;  Location: [facility];  Service: General;  Laterality: N/A;    Family History  Problem Relation Age of Onset   Healthy Mother    Heart attack Father 4   AAA (abdominal aortic aneurysm) Father    Healthy Son    Healthy Son    Healthy Son    Cancer Maternal Grandfather    Multiple sclerosis Son        youngest   Allergic rhinitis Neg Hx    Angioedema Neg Hx    Asthma Neg Hx    Atopy Neg Hx    Eczema Neg Hx    Immunodeficiency Neg Hx    Urticaria Neg Hx     Social History   Socioeconomic History   Marital status: Married    Spouse name: Not  on file   Number of children: 3   Years of education: Not on file   Highest education level: Not on file  Occupational History   Occupation: retired-warehouse supervisor  Tobacco Use   Smoking status: Former    Types: Cigars    Quit date: 07/21/1995    Years since quitting: 27.2   Smokeless tobacco: Never  Vaping Use   Vaping Use: Never used  Substance and Sexual Activity   Alcohol use: Not Currently    Comment: social   Drug use: No   Sexual activity: Not on file    Comment: MARRIED  Other Topics Concern   Not on file  Social History Narrative   Not on file   Social Determinants of Health   Financial Resource Strain: Not on file  Food Insecurity: Not on file  Transportation Needs: Not on file  Physical Activity: Not on file  Stress: Not on file  Social Connections: Not on file  Intimate Partner  Violence: Not on file     Physical Exam   Vitals:   10/06/22 2257  BP: (!) 146/61  Pulse: 74  Resp: 18  Temp: 97.7 F (36.5 C)  SpO2: 100%    CONSTITUTIONAL: Well-appearing, NAD NEURO/PSYCH:  Alert and oriented x 3, no focal deficits EYES:  eyes equal and reactive, normal extraocular movements ENT/NECK:  no LAD, no JVD CARDIO: Regular rate, well-perfused, normal S1 and S2 PULM:  CTAB no wheezing or rhonchi GI/GU:  non-distended, non-tender MSK/SPINE:  No gross deformities, no edema SKIN:  no rash, atraumatic   *Additional and/or pertinent findings included in MDM below  Diagnostic and Interventional Summary    EKG Interpretation  Date/Time:    Ventricular Rate:    PR Interval:    QRS Duration:   QT Interval:    QTC Calculation:   R Axis:     Text Interpretation:         Labs Reviewed - No data to display  No orders to display    Medications  tetracaine (PONTOCAINE) 0.5 % ophthalmic solution 2 drop (has no administration in time range)  fluorescein ophthalmic strip 1 strip (has no administration in time range)     Procedures  /  Critical Care Procedures  ED Course and Medical Decision Making  Initial Impression and Ddx Patient explains that his symptoms are much improved since arriving here in the emergency department.  Not really having any discomfort at this time.  Provided with tetracaine and fluorescein dye and there is a punctate corneal abrasion at the 12 o'clock position on the left eye.  No other symptoms to suggest acute angle-closure glaucoma or retinal detachment or any other eye emergency.  Past medical/surgical history that increases complexity of ED encounter: None  Interpretation of Diagnostics Laboratory and/or imaging options to aid in the diagnosis/care of the patient were considered.  After careful history and physical examination, it was determined that there was no indication for diagnostics at this time.  Patient Reassessment and  Ultimate Disposition/Management     Discharge, advised to follow-up with his ophthalmologist this week.  Patient management required discussion with the following services or consulting groups:  None  Complexity of Problems Addressed Acute complicated illness or Injury  Additional Data Reviewed and Analyzed Further history obtained from: Recent Consult notes  Additional Factors Impacting ED Encounter Risk Prescriptions  Barth Kirks. Sedonia Small, MD [employer] [email or web address]  Final Clinical Impressions(s) / ED Diagnoses  ICD-10-CM   1. Abrasion of left cornea, initial encounter  [email or web address]       ED Discharge Orders          Ordered    erythromycin ophthalmic ointment  4 times daily        10/07/22 0111             Discharge Instructions Discussed with and Provided to Patient:    Discharge Instructions      You were evaluated in the Emergency Department and after careful evaluation, we did not find any emergent condition requiring admission or further testing in the hospital.  Your exam/testing today is overall reassuring.  Symptoms seem to be due to a corneal abrasion.  Take the Romycin ointment as directed.  Follow-up with Dr. Katy Fitch within the next week.  Please return to the Emergency Department if you experience any worsening of your condition.   Thank you for allowing [place] to be a part of your care.      Maudie Flakes, MD 10/07/22 [phone]

## 2022-10-07 NOTE — ED Notes (Signed)
Reviewed AVS/discharge instruction with patient. Time allotted for and all questions answered. Patient is agreeable for d/c and escorted to ed exit by staff.  

## 2022-10-08 DIAGNOSIS — H16122 Filamentary keratitis, left eye: Secondary | ICD-10-CM | POA: Diagnosis not present

## 2022-10-12 DIAGNOSIS — H16122 Filamentary keratitis, left eye: Secondary | ICD-10-CM | POA: Diagnosis not present

## 2022-10-13 ENCOUNTER — Telehealth: Payer: Self-pay

## 2022-10-13 NOTE — Telephone Encounter (Signed)
        Patient  visited [facility] on 3/20     Telephone encounter attempt :  1st  A HIPAA compliant voice message was left requesting a return call.  Instructed patient to call back .    [facility] [phone] [address], [place], [place] [address] Phone: [phone] Email: [email or web address]

## 2022-10-14 ENCOUNTER — Telehealth: Payer: Self-pay

## 2022-10-14 NOTE — Telephone Encounter (Signed)
     Patient  visit on 3/20  at [facility]    Have you been able to follow up with your primary care physician? Yes   The patient was or was not able to obtain any needed medicine or equipment. No   Are there diet recommendations that you are having difficulty following? Na   Patient expresses understanding of discharge instructions and education provided has no other needs at this time.  Yes      [facility] [phone] [address], [place], [place] [address] Phone: [phone] Email: [email or web address]

## 2022-10-15 DIAGNOSIS — I10 Essential (primary) hypertension: Secondary | ICD-10-CM | POA: Diagnosis not present

## 2022-10-30 DIAGNOSIS — H18832 Recurrent erosion of cornea, left eye: Secondary | ICD-10-CM | POA: Diagnosis not present

## 2022-11-13 DIAGNOSIS — M170 Bilateral primary osteoarthritis of knee: Secondary | ICD-10-CM | POA: Diagnosis not present

## 2022-11-19 DIAGNOSIS — H18832 Recurrent erosion of cornea, left eye: Secondary | ICD-10-CM | POA: Diagnosis not present

## 2022-11-19 DIAGNOSIS — S0502XA Injury of conjunctiva and corneal abrasion without foreign body, left eye, initial encounter: Secondary | ICD-10-CM | POA: Diagnosis not present

## 2022-11-23 DIAGNOSIS — H04123 Dry eye syndrome of bilateral lacrimal glands: Secondary | ICD-10-CM | POA: Diagnosis not present

## 2022-11-23 DIAGNOSIS — M170 Bilateral primary osteoarthritis of knee: Secondary | ICD-10-CM | POA: Diagnosis not present

## 2022-11-23 DIAGNOSIS — H179 Unspecified corneal scar and opacity: Secondary | ICD-10-CM | POA: Diagnosis not present

## 2022-11-23 DIAGNOSIS — H18832 Recurrent erosion of cornea, left eye: Secondary | ICD-10-CM | POA: Diagnosis not present

## 2022-11-23 DIAGNOSIS — H01023 Squamous blepharitis right eye, unspecified eyelid: Secondary | ICD-10-CM | POA: Diagnosis not present

## 2022-11-23 DIAGNOSIS — H01026 Squamous blepharitis left eye, unspecified eyelid: Secondary | ICD-10-CM | POA: Diagnosis not present

## 2022-11-23 DIAGNOSIS — Z961 Presence of intraocular lens: Secondary | ICD-10-CM | POA: Diagnosis not present

## 2022-11-27 DIAGNOSIS — M170 Bilateral primary osteoarthritis of knee: Secondary | ICD-10-CM | POA: Diagnosis not present

## 2022-12-31 DIAGNOSIS — H04123 Dry eye syndrome of bilateral lacrimal glands: Secondary | ICD-10-CM | POA: Diagnosis not present

## 2022-12-31 DIAGNOSIS — H35373 Puckering of macula, bilateral: Secondary | ICD-10-CM | POA: Diagnosis not present

## 2022-12-31 DIAGNOSIS — H348312 Tributary (branch) retinal vein occlusion, right eye, stable: Secondary | ICD-10-CM | POA: Diagnosis not present

## 2022-12-31 DIAGNOSIS — S0502XS Injury of conjunctiva and corneal abrasion without foreign body, left eye, sequela: Secondary | ICD-10-CM | POA: Diagnosis not present

## 2022-12-31 DIAGNOSIS — Z961 Presence of intraocular lens: Secondary | ICD-10-CM | POA: Diagnosis not present

## 2023-01-11 DIAGNOSIS — H01026 Squamous blepharitis left eye, unspecified eyelid: Secondary | ICD-10-CM | POA: Diagnosis not present

## 2023-01-11 DIAGNOSIS — H16213 Exposure keratoconjunctivitis, bilateral: Secondary | ICD-10-CM | POA: Diagnosis not present

## 2023-01-11 DIAGNOSIS — H18832 Recurrent erosion of cornea, left eye: Secondary | ICD-10-CM | POA: Diagnosis not present

## 2023-01-11 DIAGNOSIS — H01023 Squamous blepharitis right eye, unspecified eyelid: Secondary | ICD-10-CM | POA: Diagnosis not present

## 2023-01-11 DIAGNOSIS — H04123 Dry eye syndrome of bilateral lacrimal glands: Secondary | ICD-10-CM | POA: Diagnosis not present

## 2023-01-11 DIAGNOSIS — H179 Unspecified corneal scar and opacity: Secondary | ICD-10-CM | POA: Diagnosis not present

## 2023-01-11 DIAGNOSIS — Z961 Presence of intraocular lens: Secondary | ICD-10-CM | POA: Diagnosis not present

## 2023-02-03 DIAGNOSIS — H01026 Squamous blepharitis left eye, unspecified eyelid: Secondary | ICD-10-CM | POA: Diagnosis not present

## 2023-02-03 DIAGNOSIS — H40023 Open angle with borderline findings, high risk, bilateral: Secondary | ICD-10-CM | POA: Diagnosis not present

## 2023-02-03 DIAGNOSIS — H179 Unspecified corneal scar and opacity: Secondary | ICD-10-CM | POA: Diagnosis not present

## 2023-02-03 DIAGNOSIS — H18832 Recurrent erosion of cornea, left eye: Secondary | ICD-10-CM | POA: Diagnosis not present

## 2023-02-03 DIAGNOSIS — H34231 Retinal artery branch occlusion, right eye: Secondary | ICD-10-CM | POA: Diagnosis not present

## 2023-02-03 DIAGNOSIS — Z961 Presence of intraocular lens: Secondary | ICD-10-CM | POA: Diagnosis not present

## 2023-02-03 DIAGNOSIS — H04123 Dry eye syndrome of bilateral lacrimal glands: Secondary | ICD-10-CM | POA: Diagnosis not present

## 2023-02-03 DIAGNOSIS — H01023 Squamous blepharitis right eye, unspecified eyelid: Secondary | ICD-10-CM | POA: Diagnosis not present

## 2023-02-19 DIAGNOSIS — H16122 Filamentary keratitis, left eye: Secondary | ICD-10-CM | POA: Diagnosis not present

## 2023-02-19 DIAGNOSIS — H18832 Recurrent erosion of cornea, left eye: Secondary | ICD-10-CM | POA: Diagnosis not present

## 2023-02-22 DIAGNOSIS — H16122 Filamentary keratitis, left eye: Secondary | ICD-10-CM | POA: Diagnosis not present

## 2023-02-22 DIAGNOSIS — H18832 Recurrent erosion of cornea, left eye: Secondary | ICD-10-CM | POA: Diagnosis not present

## 2023-03-01 DIAGNOSIS — H18832 Recurrent erosion of cornea, left eye: Secondary | ICD-10-CM | POA: Diagnosis not present

## 2023-03-01 DIAGNOSIS — H16122 Filamentary keratitis, left eye: Secondary | ICD-10-CM | POA: Diagnosis not present

## 2023-03-01 DIAGNOSIS — H04123 Dry eye syndrome of bilateral lacrimal glands: Secondary | ICD-10-CM | POA: Diagnosis not present

## 2023-03-04 DIAGNOSIS — Z961 Presence of intraocular lens: Secondary | ICD-10-CM | POA: Diagnosis not present

## 2023-03-04 DIAGNOSIS — H18832 Recurrent erosion of cornea, left eye: Secondary | ICD-10-CM | POA: Diagnosis not present

## 2023-03-04 DIAGNOSIS — H04123 Dry eye syndrome of bilateral lacrimal glands: Secondary | ICD-10-CM | POA: Diagnosis not present

## 2023-03-04 DIAGNOSIS — H16122 Filamentary keratitis, left eye: Secondary | ICD-10-CM | POA: Diagnosis not present

## 2023-04-08 DIAGNOSIS — Z23 Encounter for immunization: Secondary | ICD-10-CM | POA: Diagnosis not present

## 2023-04-08 DIAGNOSIS — Z8673 Personal history of transient ischemic attack (TIA), and cerebral infarction without residual deficits: Secondary | ICD-10-CM | POA: Diagnosis not present

## 2023-04-08 DIAGNOSIS — H6123 Impacted cerumen, bilateral: Secondary | ICD-10-CM | POA: Diagnosis not present

## 2023-04-08 DIAGNOSIS — L309 Dermatitis, unspecified: Secondary | ICD-10-CM | POA: Diagnosis not present

## 2023-04-08 DIAGNOSIS — M170 Bilateral primary osteoarthritis of knee: Secondary | ICD-10-CM | POA: Diagnosis not present

## 2023-04-08 DIAGNOSIS — M19049 Primary osteoarthritis, unspecified hand: Secondary | ICD-10-CM | POA: Diagnosis not present

## 2023-04-08 DIAGNOSIS — I2510 Atherosclerotic heart disease of native coronary artery without angina pectoris: Secondary | ICD-10-CM | POA: Diagnosis not present

## 2023-04-14 DIAGNOSIS — M79642 Pain in left hand: Secondary | ICD-10-CM | POA: Diagnosis not present

## 2023-04-14 DIAGNOSIS — M1990 Unspecified osteoarthritis, unspecified site: Secondary | ICD-10-CM | POA: Diagnosis not present

## 2023-04-14 DIAGNOSIS — E785 Hyperlipidemia, unspecified: Secondary | ICD-10-CM | POA: Diagnosis not present

## 2023-04-14 DIAGNOSIS — M79641 Pain in right hand: Secondary | ICD-10-CM | POA: Diagnosis not present

## 2023-04-14 DIAGNOSIS — I10 Essential (primary) hypertension: Secondary | ICD-10-CM | POA: Diagnosis not present

## 2023-04-14 DIAGNOSIS — M170 Bilateral primary osteoarthritis of knee: Secondary | ICD-10-CM | POA: Diagnosis not present

## 2023-05-06 DIAGNOSIS — H349 Unspecified retinal vascular occlusion: Secondary | ICD-10-CM | POA: Diagnosis not present

## 2023-05-06 DIAGNOSIS — Z8673 Personal history of transient ischemic attack (TIA), and cerebral infarction without residual deficits: Secondary | ICD-10-CM | POA: Diagnosis not present

## 2023-05-12 NOTE — Addendum Note (Signed)
Addended by: Burnetta Sabin on: 05/12/2023 09:00 AM   Modules accepted: Orders

## 2023-05-28 ENCOUNTER — Ambulatory Visit (HOSPITAL_COMMUNITY): Payer: Medicare Other | Attending: Cardiovascular Disease

## 2023-05-31 DIAGNOSIS — H01026 Squamous blepharitis left eye, unspecified eyelid: Secondary | ICD-10-CM | POA: Diagnosis not present

## 2023-05-31 DIAGNOSIS — H18832 Recurrent erosion of cornea, left eye: Secondary | ICD-10-CM | POA: Diagnosis not present

## 2023-05-31 DIAGNOSIS — H40023 Open angle with borderline findings, high risk, bilateral: Secondary | ICD-10-CM | POA: Diagnosis not present

## 2023-05-31 DIAGNOSIS — H34231 Retinal artery branch occlusion, right eye: Secondary | ICD-10-CM | POA: Diagnosis not present

## 2023-05-31 DIAGNOSIS — H01023 Squamous blepharitis right eye, unspecified eyelid: Secondary | ICD-10-CM | POA: Diagnosis not present

## 2023-05-31 DIAGNOSIS — H04123 Dry eye syndrome of bilateral lacrimal glands: Secondary | ICD-10-CM | POA: Diagnosis not present

## 2023-06-08 DIAGNOSIS — M170 Bilateral primary osteoarthritis of knee: Secondary | ICD-10-CM | POA: Diagnosis not present

## 2023-06-09 ENCOUNTER — Ambulatory Visit (HOSPITAL_COMMUNITY): Payer: Medicare Other | Attending: Internal Medicine

## 2023-06-09 ENCOUNTER — Encounter (HOSPITAL_COMMUNITY): Payer: Self-pay | Admitting: Physician Assistant

## 2023-06-15 DIAGNOSIS — M170 Bilateral primary osteoarthritis of knee: Secondary | ICD-10-CM | POA: Diagnosis not present

## 2023-06-22 DIAGNOSIS — M170 Bilateral primary osteoarthritis of knee: Secondary | ICD-10-CM | POA: Diagnosis not present

## 2023-06-26 ENCOUNTER — Other Ambulatory Visit: Payer: Self-pay | Admitting: Cardiovascular Disease

## 2023-06-30 ENCOUNTER — Ambulatory Visit (INDEPENDENT_AMBULATORY_CARE_PROVIDER_SITE_OTHER): Payer: Medicare Other | Admitting: Neurology

## 2023-06-30 ENCOUNTER — Encounter: Payer: Self-pay | Admitting: Neurology

## 2023-06-30 ENCOUNTER — Other Ambulatory Visit (HOSPITAL_COMMUNITY): Payer: Self-pay | Admitting: Physician Assistant

## 2023-06-30 VITALS — BP 116/61 | HR 72 | Ht 70.0 in | Wt 301.6 lb

## 2023-06-30 DIAGNOSIS — I779 Disorder of arteries and arterioles, unspecified: Secondary | ICD-10-CM

## 2023-06-30 DIAGNOSIS — I6521 Occlusion and stenosis of right carotid artery: Secondary | ICD-10-CM | POA: Diagnosis not present

## 2023-06-30 DIAGNOSIS — H34231 Retinal artery branch occlusion, right eye: Secondary | ICD-10-CM

## 2023-06-30 NOTE — Patient Instructions (Addendum)
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 I also advised the patient to eat a healthy diet with plenty of whole grains, cereals, fruits and vegetables, exercise regularly and maintain ideal body weight I also counseled the patient to use a CPAP every night for his sleep apnea.  Check screening Carotid ultrasound .followup in the future with me in future only if needed or call earlier if necessary

## 2023-06-30 NOTE — Progress Notes (Signed)
[facility] [address] [place]. [place] [address] [phone]       OFFICE FOLLOW-UP NOTE  [name] Date of Birth:  Apr 03, 1946 Medical Record Number:  [id number]   HPI: Initial visit 07/29/2021 Erik Wiggins is 77 year old Caucasian male seen today for first office follow-up visit following hospital admission for stroke in November 2022.  History is obtained from the patient and review of electronic medical records and opossum reviewed available pertinent imaging from in PACS.  He has past medical history of hypertension, hyperlipidemia, obesity, obstructive sleep apnea on CPAP and asthma.  He presented on 05/27/2021 with sudden onset of right eye inferior nasal visual field defect.  He got up in the morning and was fine and while driving down the road a few hours later he noticed sudden onset of vision loss with a dark spot in the right eye in the lower nasal visual field.  He initially thought this was a sunglasses and tried to remove his glasses but this spot persisted.  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  He was referred to the ED for urgent evaluation.  He presented outside window for IV thrombolysis.  His work-up included an MRI scan of the brain as well as orbits which were both normal.  CT angiogram of the head and neck was obtained which showed subtle intimal irregularity of the horizontal petrous portion of the right internal carotid artery suspicious for atheroma versus focal dissection.  Hemoglobin A1c was 6.0.  Urine drug screen was negative.  LDL cholesterol was optimal at 51 mg percent.  Echocardiogram showed ejection fraction of 60 to 65% without cardiac source of embolism.  Telemetry monitoring during the hospitalization followed by outpatient 1 week of cardiac monitoring revealed no evidence of atrial fibrillation and only very few brief episodes of nonsustained SVT.  Patient was  started on aspirin and Plavix for 3 weeks followed by Plavix alone which is tolerating well without bleeding or bruising.  His change.  Vision loss still persists and is unchanged.  He is started eating healthy and has joined [facility] [employer] program and lost 10 pounds.  He also exercise regularly and goes to the gym.  He is has been able to drive but has to be careful.  He has no new complaints.  He has been compliant with using his CPAP every night.  His blood pressure is under good control today it is 131/69.  He has no prior history of strokes TIAs or significant neurological problems. Update 07/14/2022 ; he returns for follow-up after last visit 11 months ago.  He states he is doing well.  No further episodes of vision loss or other stroke or TIA symptoms.  Continues to have right eye inferior quadrant vision loss which is unchanged from last visit.  No new complaints.  He is tolerating Plavix well without bruising or bleeding.  He does use his CPAP every night without fail for sleep apnea.  He has not had side effects .  He has had diarrhea stools 5-6 times per day for the last 2 to 3 months but has not yet discussed it with his primary physician but plans to do so soon.  Carotid ultrasound on 05/27/2022 showed 40 to 59% right ICA and 1-39% left ICA stenosis.  Lab work on 06/03/2022 showed LDL cholesterol to be optimal at 45 mg percent however triglycerides were elevated  at 232.  Echocardiogram on 06/04/2022 showed ejection fraction of 60 with mild left ventricular hypertrophy.  Mild left atrial dilatation.  No new neurological complaints today. Update 06/30/2023 : He returns for follow-up after last visit a year ago.  He is doing well.  He has had no further episodes of vision loss or any other kind of stroke or TIA symptoms.  Continues to have some vision loss in the right eye lower quadrant.  Remains on Plavix which is tolerating well with only minor bruising and no bleeding episodes.   He states his blood pressure is under good control and today he was 116/61.  He is compliant with his CPAP and uses it every night.  He has had no new health problems in the interim.  Lab work on 09/10/2022 showed LDL cholesterol 51 mg percent.  Remains on Crestor and Zetia and is tolerating them well without muscle aches or pains.  She has no new complaints.  Last carotid ultrasound 05/27/2022 had shown moderate 40-59% right ICA and 1-39% left ICA stenosis.  He did have an appointment for follow-up ultrasound but he missed that appointment. 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   Carotid US 05/27/2022: RICA 40-59, LICA 1-39, right subclavian stenosis   Umbilical hernia    Vitamin D deficiency     Social History:  Social History   Socioeconomic History   Marital status: Married    Spouse name: Not on file   Number of  children: 3   Years of education: Not on file   Highest education level: Not on file  Occupational History   Occupation: Chief of Staff  Tobacco Use   Smoking status: Former    Types: Cigars    Quit date: 07/21/1995    Years since quitting: 27.9   Smokeless tobacco: Never  Vaping Use   Vaping status: Never Used  Substance and Sexual Activity   Alcohol use: Not Currently    Comment: social   Drug use: No   Sexual activity: Not on file    Comment: MARRIED  Other Topics Concern   Not on file  Social History Narrative   Not on file   Social Determinants of Health   Financial Resource Strain: Not on file  Food Insecurity: Not on file  Transportation Needs: Not  on file  Physical Activity: Not on file  Stress: Not on file  Social Connections: Unknown (12/02/2021)   Received from [facility]   Social Network    Social Network: Not on file  Intimate Partner Violence: Unknown (10/24/2021)   Received from [facility]   HITS    Physically Hurt: Not on file    Insult or Talk Down To: Not on file    Threaten Physical Harm: Not on file    Scream or Curse: Not on file    Medications:   Current Outpatient Medications on File Prior to Visit  Medication Sig Dispense Refill   albuterol (PROAIR HFA) 108 (90 Base) MCG/ACT inhaler Inhale 4 puffs into the lungs every 4 (four) hours as needed for wheezing or shortness of breath. 2 puffs every 6 hrs as needed 1 Inhaler 2   azelastine (ASTELIN) 0.1 % nasal spray Place 1-2 sprays into both nostrils 2 (two) times daily as needed (nasal drainage). Use in each nostril as directed 30 mL 0   cetirizine (ZYRTEC) 10 MG tablet Take 10 mg by mouth daily.     Cholecalciferol (VITAMIN D) 2000 units CAPS Take 2,000 Units by mouth 2 (two) times daily.     clopidogrel (PLAVIX) 75 MG tablet TAKE ONE TABLET BY MOUTH ONE TIME DAILY 90 tablet 0   ezetimibe (ZETIA) 10 MG tablet TAKE ONE TABLET BY MOUTH ONE TIME DAILY  90 tablet 0   indapamide (LOZOL) 2.5 MG tablet Take 2.5 mg by mouth daily.      ipratropium (ATROVENT) 0.03 % nasal spray Place 1 spray into both nostrils daily as needed for rhinitis.  6   montelukast (SINGULAIR) 10 MG tablet Take 10 mg by mouth at bedtime.      rosuvastatin (CRESTOR) 20 MG tablet Take 1 tablet (20 mg total) by mouth daily. 90 tablet 3   sertraline (ZOLOFT) 50 MG tablet Take 50 mg by mouth daily.      Triamcinolone Acetonide (NASACORT ALLERGY 24HR NA) Place 1 spray into the nose daily as needed (congestion).     TURMERIC CURCUMIN PO Take 1,500 mg by mouth daily.     No current facility-administered medications on file prior to visit.    Allergies:   Allergies  Allergen Reactions   Lisinopril Swelling and Other (See Comments)    Swollen lips   Sulfa Antibiotics Hives   Sulfacetamide Sodium Other (See Comments)    Physical Exam General: Obese elderly Caucasian male, seated, in no evident distress Head: head normocephalic and atraumatic.  Neck: supple with no carotid or supraclavicular bruits Cardiovascular: regular rate and rhythm, no murmurs Musculoskeletal: no deformity Skin:  no rash/petichiae Vascular:  Normal pulses all extremities Vitals:   06/30/23 1306  BP: 116/61  Pulse: 72   Neurologic Exam Mental Status: Awake and fully alert. Oriented to place and time. Recent and remote memory intact. Attention span, concentration and fund of knowledge appropriate. Mood and affect appropriate.  Cranial Nerves: Fundoscopic exam not done. Pupils equal, briskly reactive to light.  Right eye inferior nasal visual field defect.  Extraocular movements full without nystagmus. Marland Kitchen Hearing intact. Facial sensation intact. Face, tongue, palate moves normally and symmetrically.  Motor: Normal bulk and tone. Normal strength in all tested extremity muscles. Sensory.: intact to touch ,pinprick .position and vibratory sensation.  Coordination: Rapid alternating movements normal in  all extremities. Finger-to-nose and heel-to-shin performed accurately bilaterally. Gait and Station: Arises from chair without difficulty. Stance is normal.  Uses a cane for ambulation.  Gait demonstrates normal stride length and balance .  Not able to heel, toe and tandem walk without difficulty.  Reflexes: 1+ and symmetric. Toes downgoing.       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  Vascular risk factors of hyperlipidemia, obesity, sleep apnea and atherosclerosis.     PLAN:I had a long d/w patient about his  right eye partial vision loss due to retinal artery branch occlusion, intracranial atherosclerosis, risk for recurrent stroke/TIAs, personally independently reviewed imaging studies and stroke evaluation results and answered questions.Continue Plavix 75 mg daily  for secondary stroke prevention and maintain strict control of hypertension with blood pressure goal below 130/90, diabetes with hemoglobin A1c goal below 6.5% and lipids with LDL cholesterol goal below 70 mg/dL. I also advised the patient to eat a healthy diet with plenty of whole grains, cereals, fruits and vegetables, exercise regularly and maintain ideal body weight I also counseled the patient to use a CPAP every night for his sleep apnea.  Check screening Carotid ultrasound .followup in the future with me in future only if needed or call earlier if necessary..Greater than 50% of time during this 35 minute visit was spent on counseling,explanation of diagnosis of retinal artery branch occlusion ,stroke risk, planning of further management, discussion with patient and family and coordination of care Delia Heady, MD Note: This document was prepared with digital dictation and possible smart phrase technology. Any transcriptional  errors that result from this process are unintentional

## 2023-07-05 ENCOUNTER — Ambulatory Visit (HOSPITAL_COMMUNITY)
Admission: RE | Admit: 2023-07-05 | Discharge: 2023-07-05 | Disposition: A | Discharge status: Home / Self Care | Payer: Medicare Other | Source: Ambulatory Visit | Attending: Cardiovascular Disease | Admitting: Cardiovascular Disease

## 2023-07-05 DIAGNOSIS — I6521 Occlusion and stenosis of right carotid artery: Secondary | ICD-10-CM | POA: Insufficient documentation

## 2023-07-29 ENCOUNTER — Ambulatory Visit (HOSPITAL_COMMUNITY): Payer: Medicare Other | Attending: Cardiology

## 2023-07-29 ENCOUNTER — Encounter (HOSPITAL_COMMUNITY): Payer: Self-pay | Admitting: Physician Assistant

## 2023-07-29 NOTE — Telephone Encounter (Signed)
 Called patient and  went over the direction on how to get the office for Echo  testing. Patient verbalized understanding.   Sent this message to scheduler to contact patient .

## 2023-08-02 DIAGNOSIS — H34231 Retinal artery branch occlusion, right eye: Secondary | ICD-10-CM | POA: Diagnosis not present

## 2023-08-02 DIAGNOSIS — H35373 Puckering of macula, bilateral: Secondary | ICD-10-CM | POA: Diagnosis not present

## 2023-08-02 DIAGNOSIS — Z961 Presence of intraocular lens: Secondary | ICD-10-CM | POA: Diagnosis not present

## 2023-08-02 DIAGNOSIS — H01023 Squamous blepharitis right eye, unspecified eyelid: Secondary | ICD-10-CM | POA: Diagnosis not present

## 2023-08-02 DIAGNOSIS — H40023 Open angle with borderline findings, high risk, bilateral: Secondary | ICD-10-CM | POA: Diagnosis not present

## 2023-08-02 DIAGNOSIS — H04123 Dry eye syndrome of bilateral lacrimal glands: Secondary | ICD-10-CM | POA: Diagnosis not present

## 2023-08-02 DIAGNOSIS — H18832 Recurrent erosion of cornea, left eye: Secondary | ICD-10-CM | POA: Diagnosis not present

## 2023-08-02 DIAGNOSIS — H01026 Squamous blepharitis left eye, unspecified eyelid: Secondary | ICD-10-CM | POA: Diagnosis not present

## 2023-08-02 DIAGNOSIS — D3131 Benign neoplasm of right choroid: Secondary | ICD-10-CM | POA: Diagnosis not present

## 2023-08-19 DIAGNOSIS — M1990 Unspecified osteoarthritis, unspecified site: Secondary | ICD-10-CM | POA: Diagnosis not present

## 2023-08-19 DIAGNOSIS — M170 Bilateral primary osteoarthritis of knee: Secondary | ICD-10-CM | POA: Diagnosis not present

## 2023-08-19 DIAGNOSIS — E785 Hyperlipidemia, unspecified: Secondary | ICD-10-CM | POA: Diagnosis not present

## 2023-08-19 DIAGNOSIS — M79642 Pain in left hand: Secondary | ICD-10-CM | POA: Diagnosis not present

## 2023-08-19 DIAGNOSIS — I10 Essential (primary) hypertension: Secondary | ICD-10-CM | POA: Diagnosis not present

## 2023-08-25 ENCOUNTER — Ambulatory Visit (HOSPITAL_COMMUNITY): Payer: Medicare Other | Attending: Physician Assistant

## 2023-08-25 DIAGNOSIS — I7121 Aneurysm of the ascending aorta, without rupture: Secondary | ICD-10-CM | POA: Diagnosis not present

## 2023-08-25 DIAGNOSIS — I2510 Atherosclerotic heart disease of native coronary artery without angina pectoris: Secondary | ICD-10-CM | POA: Diagnosis not present

## 2023-08-25 DIAGNOSIS — I77810 Thoracic aortic ectasia: Secondary | ICD-10-CM | POA: Insufficient documentation

## 2023-08-25 LAB — ECHOCARDIOGRAM COMPLETE
Area-P 1/2: 2.5 cm2
S' Lateral: 2.5 cm

## 2023-08-26 ENCOUNTER — Encounter: Payer: Self-pay | Admitting: Cardiovascular Disease

## 2023-08-26 ENCOUNTER — Ambulatory Visit
Admission: EM | Admit: 2023-08-26 | Discharge: 2023-08-26 | Disposition: A | Discharge status: Home / Self Care | Payer: Medicare Other | Attending: Family Medicine | Admitting: Family Medicine

## 2023-08-26 DIAGNOSIS — I519 Heart disease, unspecified: Secondary | ICD-10-CM

## 2023-08-26 DIAGNOSIS — J4520 Mild intermittent asthma, uncomplicated: Secondary | ICD-10-CM

## 2023-08-26 DIAGNOSIS — I7121 Aneurysm of the ascending aorta, without rupture: Secondary | ICD-10-CM

## 2023-08-26 DIAGNOSIS — I77810 Thoracic aortic ectasia: Secondary | ICD-10-CM

## 2023-08-26 DIAGNOSIS — U071 COVID-19: Secondary | ICD-10-CM | POA: Diagnosis not present

## 2023-08-26 MED ORDER — PAXLOVID (300/100) 20 X 150 MG & 10 X 100MG PO TBPK: ORAL_TABLET | ORAL | 0 refills | Status: DC

## 2023-08-26 NOTE — Telephone Encounter (Signed)
-----   Message from Glendia Ferrier sent at 08/26/2023  9:05 AM EST ----- Result note sent to Erik Wiggins via MyChart. See comments below. Copy sent to PCP as FYI. PLAN:  -Repeat echocardiogram 1 year (Dx: Ascending thoracic aorta dilation)  Erik Wiggins  Your echocardiogram demonstrates normal ejection fraction (heart function).  The aorta remains mildly dilated.  There has not been a significant increase in size.  We will continue to monitor this with another echocardiogram in 1 year. Glendia Ferrier, PA-C    08/26/2023 9:02 AM

## 2023-08-26 NOTE — Progress Notes (Signed)
Pt cancelled  This encounter was created in error - please disregard. 

## 2023-08-26 NOTE — ED Provider Notes (Signed)
 [facility]  Note:  This document was prepared using Conservation officer, historic buildings and may include unintentional dictation errors.  MRN: [id number] DOB: 04/12/1946  Subjective:   Erik Wiggins is a 78 y.o. male presenting for 3-day history of acute onset coughing, chest congestion, sinus congestion, malaise and fatigue.  Did a COVID test today and resulted positive.  Presents the result in clinic.  Has a history of heart disease but not MRI.  Does have a history of a TIA.  Also has a history of allergic rhinitis, asthma.  Would like COVID antiviral medication.  Last GFR from 2024 was 73 mL/min.  No current facility-administered medications for this encounter.  Current Outpatient Medications:    albuterol  (PROAIR  HFA) 108 (90 Base) MCG/ACT inhaler, Inhale 4 puffs into the lungs every 4 (four) hours as needed for wheezing or shortness of breath. 2 puffs every 6 hrs as needed, Disp: 1 Inhaler, Rfl: 2   azelastine  (ASTELIN ) 0.1 % nasal spray, Place 1-2 sprays into both nostrils 2 (two) times daily as needed (nasal drainage). Use in each nostril as directed, Disp: 30 mL, Rfl: 0   cetirizine (ZYRTEC) 10 MG tablet, Take 10 mg by mouth daily., Disp: , Rfl:    Cholecalciferol  (VITAMIN D ) 2000 units CAPS, Take 2,000 Units by mouth 2 (two) times daily., Disp: , Rfl:    clopidogrel  (PLAVIX ) 75 MG tablet, TAKE ONE TABLET BY MOUTH ONE TIME DAILY, Disp: 90 tablet, Rfl: 0   ezetimibe  (ZETIA ) 10 MG tablet, TAKE ONE TABLET BY MOUTH ONE TIME DAILY, Disp: 90 tablet, Rfl: 0   indapamide (LOZOL) 2.5 MG tablet, Take 2.5 mg by mouth daily. , Disp: , Rfl:    ipratropium (ATROVENT) 0.03 % nasal spray, Place 1 spray into both nostrils daily as needed for rhinitis., Disp: , Rfl: 6   montelukast  (SINGULAIR ) 10 MG tablet, Take 10 mg by mouth at bedtime. , Disp: , Rfl:    rosuvastatin  (CRESTOR ) 20 MG tablet, Take 1 tablet (20 mg total) by mouth daily., Disp: 90 tablet, Rfl: 3   sertraline   (ZOLOFT ) 50 MG tablet, Take 50 mg by mouth daily. 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   Carotid US  05/27/2022: RICA 40-59, LICA 1-39, right subclavian stenosis   Umbilical hernia    Vitamin D  deficiency      Past Surgical History:  Procedure Laterality Date   CARDIAC CATHETERIZATION     COLONSCOPY     EYE SURGERY     cataracts   INSERTION OF MESH N/A 02/11/2018   Procedure: INSERTION OF MESH;  Surgeon: Eletha Boas, MD;  Location: [facility];  Service: General;  Laterality: N/A;   UMBILICAL HERNIA REPAIR  N/A 02/11/2018   Procedure: OPEN REPAIR UMBILICAL HERNIA;  Surgeon: Eletha Boas, MD;  Location: [facility];  Service: General;  Laterality: N/A;    Family History  Problem Relation Age of Onset   Healthy Mother    Heart attack Father 78   AAA (abdominal aortic aneurysm) Father    Healthy Son    Healthy Son    Healthy Son    Cancer Maternal Grandfather    Multiple sclerosis Son        youngest   Allergic rhinitis Neg Hx    Angioedema Neg Hx    Asthma Neg Hx    Atopy Neg Hx    Eczema Neg Hx    Immunodeficiency Neg Hx    Urticaria Neg Hx     Social History   Tobacco Use   Smoking status: Former    Types: Cigars    Quit date: 07/21/1995    Years since quitting: 28.1   Smokeless tobacco: Never  Vaping Use   Vaping status: Never Used  Substance Use Topics   Alcohol use: Not Currently    Comment: social   Drug use: No    ROS   Objective:   Vitals: BP 135/70 (BP Location: Right Arm)   Pulse 69   Temp 98.7 F (37.1 C) (Oral)   Resp 20   SpO2 95%   Physical Exam Constitutional:      General: He is not in acute distress.    Appearance: Normal appearance. He is well-developed. He is not ill-appearing, toxic-appearing or diaphoretic.  HENT:     Head: Normocephalic and atraumatic.     Right Ear: External ear normal.     Left Ear: External ear normal.     Nose: Nose normal.     Mouth/Throat:     Mouth: Mucous membranes are moist.  Eyes:     General: No scleral icterus.       Right eye: No discharge.        Left eye: No discharge.     Extraocular Movements: Extraocular movements intact.  Cardiovascular:     Rate and Rhythm: Normal rate and regular rhythm.     Heart sounds: Normal heart sounds. No murmur heard.    No friction rub. No gallop.  Pulmonary:     Effort: Pulmonary effort is normal. No respiratory distress.     Breath sounds: Normal breath sounds. No stridor. No wheezing, rhonchi or rales.  Neurological:     Mental Status: He is alert  and oriented to person, place, and time.  Psychiatric:        Mood and Affect: Mood normal.        Behavior: Behavior normal.  Thought Content: Thought content normal.     Assessment and Plan :   PDMP not reviewed this encounter.  1. COVID-19 virus infection   2. Heart disease   3. Mild intermittent asthma, uncomplicated    Recommended Paxlovid  as he has significant risk factors.  Hold Crestor .  Advised to reach out to his cardiologist to see if they want to make any adjustments to his Plavix  medication.  Use supportive care otherwise.  Counseled patient on potential for adverse effects with medications prescribed/recommended today, ER and return-to-clinic precautions discussed, patient verbalized understanding.    Christopher Savannah, [place] 08/26/23 [phone]

## 2023-08-26 NOTE — ED Triage Notes (Signed)
 Pt reports + COVID test today. Reports cough, chest congestion, nasal congestion and fatigue x 3 days.

## 2023-08-26 NOTE — Discharge Instructions (Addendum)
 Please hold your cholesterol medicine. You can restart Crestor  after you finish taking Paxlovid . Talk with your heart doctor to let them know you have started Paxlovid  for COVID 19. This is in case, they want to make adjustments to your Plavix  medication.

## 2023-08-27 ENCOUNTER — Ambulatory Visit: Payer: Medicare Other | Admitting: Cardiovascular Disease

## 2023-09-16 DIAGNOSIS — I129 Hypertensive chronic kidney disease with stage 1 through stage 4 chronic kidney disease, or unspecified chronic kidney disease: Secondary | ICD-10-CM | POA: Diagnosis not present

## 2023-09-16 DIAGNOSIS — Z125 Encounter for screening for malignant neoplasm of prostate: Secondary | ICD-10-CM | POA: Diagnosis not present

## 2023-09-16 DIAGNOSIS — E785 Hyperlipidemia, unspecified: Secondary | ICD-10-CM | POA: Diagnosis not present

## 2023-09-20 DIAGNOSIS — Z125 Encounter for screening for malignant neoplasm of prostate: Secondary | ICD-10-CM | POA: Diagnosis not present

## 2023-09-20 DIAGNOSIS — Z0000 Encounter for general adult medical examination without abnormal findings: Secondary | ICD-10-CM | POA: Diagnosis not present

## 2023-09-20 DIAGNOSIS — F321 Major depressive disorder, single episode, moderate: Secondary | ICD-10-CM | POA: Diagnosis not present

## 2023-09-20 DIAGNOSIS — I6529 Occlusion and stenosis of unspecified carotid artery: Secondary | ICD-10-CM | POA: Diagnosis not present

## 2023-09-20 DIAGNOSIS — I10 Essential (primary) hypertension: Secondary | ICD-10-CM | POA: Diagnosis not present

## 2023-09-20 DIAGNOSIS — E559 Vitamin D deficiency, unspecified: Secondary | ICD-10-CM | POA: Diagnosis not present

## 2023-09-20 DIAGNOSIS — J309 Allergic rhinitis, unspecified: Secondary | ICD-10-CM | POA: Diagnosis not present

## 2023-09-20 DIAGNOSIS — M8580 Other specified disorders of bone density and structure, unspecified site: Secondary | ICD-10-CM | POA: Diagnosis not present

## 2023-09-20 DIAGNOSIS — I700 Atherosclerosis of aorta: Secondary | ICD-10-CM | POA: Diagnosis not present

## 2023-09-20 DIAGNOSIS — G4733 Obstructive sleep apnea (adult) (pediatric): Secondary | ICD-10-CM | POA: Diagnosis not present

## 2023-09-22 DIAGNOSIS — L570 Actinic keratosis: Secondary | ICD-10-CM | POA: Diagnosis not present

## 2023-09-22 DIAGNOSIS — L821 Other seborrheic keratosis: Secondary | ICD-10-CM | POA: Diagnosis not present

## 2023-09-22 DIAGNOSIS — L820 Inflamed seborrheic keratosis: Secondary | ICD-10-CM | POA: Diagnosis not present

## 2023-09-22 DIAGNOSIS — D692 Other nonthrombocytopenic purpura: Secondary | ICD-10-CM | POA: Diagnosis not present

## 2023-09-28 ENCOUNTER — Emergency Department (HOSPITAL_COMMUNITY): Admission: EM | Admit: 2023-09-28 | Discharge: 2023-09-29 | Disposition: A | Discharge status: Home / Self Care

## 2023-09-28 ENCOUNTER — Emergency Department (HOSPITAL_COMMUNITY)

## 2023-09-28 ENCOUNTER — Other Ambulatory Visit: Payer: Self-pay

## 2023-09-28 ENCOUNTER — Encounter (HOSPITAL_COMMUNITY): Payer: Self-pay

## 2023-09-28 DIAGNOSIS — R000 Tachycardia, unspecified: Secondary | ICD-10-CM | POA: Diagnosis not present

## 2023-09-28 DIAGNOSIS — Z79899 Other long term (current) drug therapy: Secondary | ICD-10-CM | POA: Insufficient documentation

## 2023-09-28 DIAGNOSIS — R4182 Altered mental status, unspecified: Secondary | ICD-10-CM | POA: Diagnosis not present

## 2023-09-28 DIAGNOSIS — M47814 Spondylosis without myelopathy or radiculopathy, thoracic region: Secondary | ICD-10-CM | POA: Insufficient documentation

## 2023-09-28 DIAGNOSIS — R42 Dizziness and giddiness: Secondary | ICD-10-CM | POA: Diagnosis not present

## 2023-09-28 DIAGNOSIS — I6782 Cerebral ischemia: Secondary | ICD-10-CM | POA: Insufficient documentation

## 2023-09-28 DIAGNOSIS — I10 Essential (primary) hypertension: Secondary | ICD-10-CM | POA: Diagnosis not present

## 2023-09-28 DIAGNOSIS — R509 Fever, unspecified: Secondary | ICD-10-CM | POA: Diagnosis not present

## 2023-09-28 LAB — CBC WITH DIFFERENTIAL/PLATELET
Abs Immature Granulocytes: 0.05 10*3/uL (ref 0.00–0.07)
Basophils Absolute: 0 10*3/uL (ref 0.0–0.1)
Basophils Relative: 1 %
Eosinophils Absolute: 0 10*3/uL (ref 0.0–0.5)
Eosinophils Relative: 0 %
HCT: 36.6 % — ABNORMAL LOW (ref 39.0–52.0)
Hemoglobin: 11.9 g/dL — ABNORMAL LOW (ref 13.0–17.0)
Immature Granulocytes: 1 %
Lymphocytes Relative: 8 %
Lymphs Abs: 0.5 10*3/uL — ABNORMAL LOW (ref 0.7–4.0)
MCH: 27.9 pg (ref 26.0–34.0)
MCHC: 32.5 g/dL (ref 30.0–36.0)
MCV: 85.9 fL (ref 80.0–100.0)
Monocytes Absolute: 0.3 10*3/uL (ref 0.1–1.0)
Monocytes Relative: 5 %
Neutro Abs: 5.6 10*3/uL (ref 1.7–7.7)
Neutrophils Relative %: 85 %
Platelets: 206 10*3/uL (ref 150–400)
RBC: 4.26 MIL/uL (ref 4.22–5.81)
RDW: 17.2 % — ABNORMAL HIGH (ref 11.5–15.5)
WBC: 6.5 10*3/uL (ref 4.0–10.5)
nRBC: 0 % (ref 0.0–0.2)

## 2023-09-28 LAB — RESP PANEL BY RT-PCR (RSV, FLU A&B, COVID)  RVPGX2
Influenza A by PCR: NEGATIVE
Influenza B by PCR: NEGATIVE
Resp Syncytial Virus by PCR: NEGATIVE
SARS Coronavirus 2 by RT PCR: NEGATIVE

## 2023-09-28 LAB — URINALYSIS, ROUTINE W REFLEX MICROSCOPIC
Bilirubin Urine: NEGATIVE
Glucose, UA: NEGATIVE mg/dL
Hgb urine dipstick: NEGATIVE
Ketones, ur: NEGATIVE mg/dL
Leukocytes,Ua: NEGATIVE
Nitrite: NEGATIVE
Protein, ur: NEGATIVE mg/dL
Specific Gravity, Urine: 1.02 (ref 1.005–1.030)
pH: 5 (ref 5.0–8.0)

## 2023-09-28 LAB — COMPREHENSIVE METABOLIC PANEL
ALT: 13 U/L (ref 0–44)
AST: 16 U/L (ref 15–41)
Albumin: 3.5 g/dL (ref 3.5–5.0)
Alkaline Phosphatase: 48 U/L (ref 38–126)
Anion gap: 12 (ref 5–15)
BUN: 12 mg/dL (ref 8–23)
CO2: 22 mmol/L (ref 22–32)
Calcium: 9.2 mg/dL (ref 8.9–10.3)
Chloride: 100 mmol/L (ref 98–111)
Creatinine, Ser: 0.89 mg/dL (ref 0.61–1.24)
GFR, Estimated: >60 mL/min (ref >60)
Glucose, Bld: 114 mg/dL — ABNORMAL HIGH (ref 70–99)
Potassium: 3.2 mmol/L — ABNORMAL LOW (ref 3.5–5.1)
Sodium: 134 mmol/L — ABNORMAL LOW (ref 135–145)
Total Bilirubin: 0.9 mg/dL (ref 0.0–1.2)
Total Protein: 6.7 g/dL (ref 6.5–8.1)

## 2023-09-28 LAB — TROPONIN I (HIGH SENSITIVITY)
Troponin I (High Sensitivity): 7 ng/L (ref <18)
Troponin I (High Sensitivity): 8 ng/L (ref <18)

## 2023-09-28 MED ORDER — MECLIZINE HCL 25 MG PO TABS
25.0000 mg | ORAL_TABLET | Freq: Once | ORAL | Status: AC
  Administered 2023-09-28: 25 mg via ORAL
  Filled 2023-09-28: qty 1

## 2023-09-28 MED ORDER — MECLIZINE HCL 25 MG PO TABS: 25.0000 mg | ORAL_TABLET | Freq: Three times a day (TID) | ORAL | 0 refills | Status: AC | PRN

## 2023-09-28 MED ORDER — POTASSIUM CHLORIDE CRYS ER 20 MEQ PO TBCR
20.0000 meq | EXTENDED_RELEASE_TABLET | Freq: Once | ORAL | Status: AC
  Administered 2023-09-29: 20 meq via ORAL
  Filled 2023-09-28: qty 1

## 2023-09-28 MED ORDER — SODIUM CHLORIDE 0.9 % IV BOLUS
1000.0000 mL | Freq: Once | INTRAVENOUS | Status: AC
  Administered 2023-09-28: 1000 mL via INTRAVENOUS

## 2023-09-28 MED ORDER — ONDANSETRON HCL 4 MG/2ML IJ SOLN
4.0000 mg | Freq: Once | INTRAMUSCULAR | Status: AC
  Administered 2023-09-28: 4 mg via INTRAVENOUS
  Filled 2023-09-28: qty 2

## 2023-09-28 NOTE — Discharge Instructions (Signed)
 Return if any problems.

## 2023-09-28 NOTE — ED Triage Notes (Signed)
 Patient from home via EMS for eval of dizziness. Woke up normal this morning and went to the flea market with his friends. Became very tired at the flea market and on the way home stopped in a parking lot and slept for 30 mins before continuing home. He got home around noon and took a nap. LKW 1200. He woke up at 1545 with dizziness worse with standing and continued weakness. Low grade fever. In continuing to talk to patient it sounds like he has had about four of these episodes per day x 3-4 days.

## 2023-09-28 NOTE — ED Provider Notes (Signed)
 [place] [facility] AT [facility] Provider Note   CSN: [id number] Arrival date & time: 09/28/23  1753     History  Chief Complaint  Patient presents with   Dizziness    Erik Wiggins is a 78 y.o. male.  Patient reports that he has felt weak and dizzy for the past week.  Patient states that he saw his primary care physician and was started on Wellbutrin.  Patient has been on sertraline in the past and his physician felt like this was not working any longer.  Patient reports he went to the flea market today and on his drive home he felt very tired and had to pull over and sleep.  Patient states he slept for about 30 minutes and then was able to drive home.  Patient reports he has had some dizzy episodes this week.  He reports that he has had 3-4 episodes a day of feeling dizzy.  Patient reports he has had similar symptoms in the past and was treated with Antivert for vertigo.  Patient reports that this dizziness is not as bad as his previous episodes.  Patient is concerned that his medication is causing his symptoms.  He denies any weakness he is not having any numbness.  Patient reports he has not had any visual changes he has not had any hearing changes.  Patient denies any cough or congestion he has not had any nausea or vomiting.  The history is provided by the patient and the spouse.  Dizziness      Home Medications Prior to Admission medications   Medication Sig Start Date End Date Taking? Authorizing Provider  meclizine (ANTIVERT) 25 MG tablet Take 1 tablet (25 mg total) by mouth 3 (three) times daily as needed for dizziness. 09/28/23  Yes Cheron Schaumann K, PA-C  albuterol (PROAIR HFA) 108 (90 Base) MCG/ACT inhaler Inhale 4 puffs into the lungs every 4 (four) hours as needed for wheezing or shortness of breath. 2 puffs every 6 hrs as needed 08/12/16   Alfonse Spruce, MD  azelastine (ASTELIN) 0.1 % nasal spray Place 1-2 sprays into both nostrils 2 (two)  times daily as needed (nasal drainage). Use in each nostril as directed 06/06/21   Ellamae Sia, DO  cetirizine (ZYRTEC) 10 MG tablet Take 10 mg by mouth daily.    [provider]  Cholecalciferol (VITAMIN D) 2000 units CAPS Take 2,000 Units by mouth 2 (two) times daily.    [provider]  clopidogrel (PLAVIX) 75 MG tablet TAKE ONE TABLET BY MOUTH ONE TIME DAILY 06/29/23   Nahser, Deloris Ping, MD  ezetimibe (ZETIA) 10 MG tablet TAKE ONE TABLET BY MOUTH ONE TIME DAILY 06/29/23   Nahser, Deloris Ping, MD  indapamide (LOZOL) 2.5 MG tablet Take 2.5 mg by mouth daily.  05/31/14   [provider]  ipratropium (ATROVENT) 0.03 % nasal spray Place 1 spray into both nostrils daily as needed for rhinitis. 05/16/14   [provider]  montelukast (SINGULAIR) 10 MG tablet Take 10 mg by mouth at bedtime.  05/31/14   [provider]  nirmatrelvir/ritonavir (PAXLOVID, 300/100,) 20 x 150 MG & 10 x 100MG  TBPK Take 2 tablets nirmtrelvir and 1 tablet ritonavir twice daily. 08/26/23   Wallis Bamberg, PA-C  rosuvastatin (CRESTOR) 20 MG tablet Take 1 tablet (20 mg total) by mouth daily. 02/05/21   Nahser, Deloris Ping, MD  sertraline (ZOLOFT) 50 MG tablet Take 50 mg by mouth daily.  10/29/16  [provider]  Triamcinolone Acetonide (NASACORT ALLERGY 24HR NA) Place 1 spray into the nose daily as needed (congestion).    [provider]  TURMERIC CURCUMIN PO Take 1,500 mg by mouth daily.    [provider]      Allergies    Lisinopril, Sulfa antibiotics, and Sulfacetamide sodium    Review of Systems   Review of Systems  Neurological:  Positive for dizziness.  All other systems reviewed and are negative.   Physical Exam Updated Vital Signs BP 124/65   Pulse 77   Temp 99.6 F (37.6 C) (Oral)   Resp (!) 23   Ht 5\' 10"  (1.778 m)   Wt 136.1 kg   SpO2 94%   BMI 43.05 kg/m  Physical Exam Vitals and nursing note reviewed.  Constitutional:      Appearance:  He is well-developed.  HENT:     Head: Normocephalic.     Right Ear: Tympanic membrane normal.     Left Ear: Tympanic membrane normal.     Nose: Nose normal.  Eyes:     Extraocular Movements: Extraocular movements intact.     Pupils: Pupils are equal, round, and reactive to light.  Cardiovascular:     Rate and Rhythm: Normal rate.  Pulmonary:     Effort: Pulmonary effort is normal.  Abdominal:     General: There is no distension.  Musculoskeletal:        General: Normal range of motion.     Cervical back: Normal range of motion.  Skin:    General: Skin is warm.  Neurological:     General: No focal deficit present.     Mental Status: He is alert and oriented to person, place, and time.  Psychiatric:        Mood and Affect: Mood normal.     ED Results / Procedures / Treatments   Labs (all labs ordered are listed, but only abnormal results are displayed) Labs Reviewed  CBC WITH DIFFERENTIAL/PLATELET - Abnormal; Notable for the following components:      Result Value   Hemoglobin 11.9 (*)    HCT 36.6 (*)    RDW 17.2 (*)    Lymphs Abs 0.5 (*)    All other components within normal limits  COMPREHENSIVE METABOLIC PANEL - Abnormal; Notable for the following components:   Sodium 134 (*)    Potassium 3.2 (*)    Glucose, Bld 114 (*)    All other components within normal limits  RESP PANEL BY RT-PCR (RSV, FLU A&B, COVID)  [id number]  URINALYSIS, ROUTINE W REFLEX MICROSCOPIC  TROPONIN I (HIGH SENSITIVITY)  TROPONIN I (HIGH SENSITIVITY)    EKG EKG Interpretation Date/Time:  Tuesday September 28 2023 18:06:06 EDT Ventricular Rate:  85 PR Interval:  170 QRS Duration:  100 QT Interval:  409 QTC Calculation: 490 R Axis:   40  Text Interpretation: Sinus rhythm Low voltage, precordial leads Abnormal R-wave progression, early transition Borderline repolarization abnormality Borderline prolonged QT interval Confirmed by Estanislado Pandy [phone]) on 09/28/2023 8:59:20 PM  Radiology DG  Chest Port 1 View Result Date: 09/28/2023 CLINICAL DATA:  Fever and dizziness EXAM: PORTABLE CHEST 1 VIEW COMPARISON:  01/15/2020 FINDINGS: The lungs appear clear. Cardiac and mediastinal contours normal. No blunting of the costophrenic angles. Mild lower thoracic spondylosis. IMPRESSION: 1. No active cardiopulmonary disease is radiographically apparent. 2. Mild lower thoracic spondylosis. Electronically Signed   By: Gaylyn Rong M.D.   On: 09/28/2023 21:00   CT Head  Wo Contrast Result Date: 09/28/2023 CLINICAL DATA:  Provided history: Mental status change, persistent or worsening. EXAM: CT HEAD WITHOUT CONTRAST TECHNIQUE: Contiguous axial images were obtained from the base of the skull through the vertex without intravenous contrast. RADIATION DOSE REDUCTION: This exam was performed according to the departmental dose-optimization program which includes automated exposure control, adjustment of the mA and/or kV according to patient size and/or use of iterative reconstruction technique. COMPARISON:  Brain MRI 05/27/2021.  CTA head/neck 05/27/2021. FINDINGS: Brain: Generalized cerebral atrophy. Patchy and ill-defined hypoattenuation within the cerebral white matter, nonspecific but compatible with mild chronic small vessel ischemic disease. There is no acute intracranial hemorrhage. No demarcated cortical infarct. No extra-axial fluid collection. No evidence of an intracranial mass. No midline shift. Vascular: No hyperdense vessels.  Atherosclerotic calcifications. Skull: No calvarial fracture or aggressive osseous lesion. Sinuses/Orbits: No mass or acute finding within the imaged orbits. Mild mucosal thickening within the bilateral maxillary sinuses the imaged levels. Mucosal thickening within both sphenoid sinuses (moderate right, mild left). Mild mucosal thickening scattered within the ethmoid air cells. Complete opacification of the left frontal sinus. IMPRESSION: 1.  No evidence of an acute intracranial  abnormality. 2. Parenchymal atrophy and chronic small vessel ischemic disease. 3. Paranasal sinus disease as described. Electronically Signed   By: Jackey Loge D.O.   On: 09/28/2023 20:42    Procedures Procedures    Medications Ordered in ED Medications  ondansetron [facility]) injection 4 mg (4 mg Intravenous Given 09/28/23 2102)  meclizine (ANTIVERT) tablet 25 mg (25 mg Oral Given 09/28/23 2252)  sodium chloride 0.9 % bolus 1,000 mL (1,000 mLs Intravenous New Bag/Given 09/28/23 2255)    ED Course/ Medical Decision Making/ A&P                                 Medical Decision Making Patient complains of dizzy episodes 3-4 times a day for the last week.  Patient reports his medicine was changed by his primary care physician 1 week ago  Amount and/or Complexity of Data Reviewed Independent Historian: spouse    Details: Patient is here with his wife who is supportive she reports patient has had similar episodes in the past and has been treated for vertigo Labs: ordered.    Details: Labs ordered reviewed and interpreted.  Potassium is 3.2.  Flu COVID and RSV are negative.  Troponin is negative Radiology: ordered and independent interpretation performed. Decision-making details documented in ED Course.    Details: CT head shows no acute abnormality ECG/medicine tests: ordered and independent interpretation performed. Decision-making details documented in ED Course.    Details: EKG shows sinus rhythm no ST changes  Risk Prescription drug management. Risk Details: Patient is given IV fluids x 1 L he is given Ativan 25 mg p.o.  Patient is counseled on results.  He is advised to contact his primary care physician tomorrow to discuss his medications.  Patient is given a prescription for meclizine.  He is discharged in stable condition.           Final Clinical Impression(s) / ED Diagnoses Final diagnoses:  Vertigo    Rx / DC Orders ED Discharge Orders          Ordered     meclizine (ANTIVERT) 25 MG tablet  3 times daily PRN        09/28/23 2345           An After Visit  Summary was printed and given to the patient.    Elson Areas, PA-C 09/28/23 2351    Coral Spikes, DO 09/28/23 2352

## 2023-09-28 NOTE — ED Notes (Signed)
 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   Pt denies feeling dizzy while sitting still.

## 2023-09-29 DIAGNOSIS — R42 Dizziness and giddiness: Secondary | ICD-10-CM | POA: Diagnosis not present

## 2023-09-30 ENCOUNTER — Other Ambulatory Visit: Payer: Self-pay | Admitting: Cardiovascular Disease

## 2023-10-07 DIAGNOSIS — R5383 Other fatigue: Secondary | ICD-10-CM | POA: Diagnosis not present

## 2023-10-07 DIAGNOSIS — R42 Dizziness and giddiness: Secondary | ICD-10-CM | POA: Diagnosis not present

## 2023-10-07 DIAGNOSIS — Z09 Encounter for follow-up examination after completed treatment for conditions other than malignant neoplasm: Secondary | ICD-10-CM | POA: Diagnosis not present

## 2023-10-14 ENCOUNTER — Encounter: Payer: Self-pay | Admitting: Cardiovascular Disease

## 2023-10-14 NOTE — Progress Notes (Unsigned)
 No show  This encounter was created in error - please disregard.

## 2023-10-15 ENCOUNTER — Ambulatory Visit: Payer: Medicare Other | Attending: Cardiovascular Disease | Admitting: Cardiovascular Disease

## 2023-10-26 ENCOUNTER — Ambulatory Visit
Admission: EM | Admit: 2023-10-26 | Discharge: 2023-10-26 | Disposition: A | Discharge status: Home / Self Care | Attending: Family Medicine | Admitting: Family Medicine

## 2023-10-26 DIAGNOSIS — H349 Unspecified retinal vascular occlusion: Secondary | ICD-10-CM | POA: Insufficient documentation

## 2023-10-26 DIAGNOSIS — I129 Hypertensive chronic kidney disease with stage 1 through stage 4 chronic kidney disease, or unspecified chronic kidney disease: Secondary | ICD-10-CM | POA: Insufficient documentation

## 2023-10-26 DIAGNOSIS — Z882 Allergy status to sulfonamides status: Secondary | ICD-10-CM | POA: Insufficient documentation

## 2023-10-26 DIAGNOSIS — E669 Obesity, unspecified: Secondary | ICD-10-CM | POA: Insufficient documentation

## 2023-10-26 DIAGNOSIS — R9439 Abnormal result of other cardiovascular function study: Secondary | ICD-10-CM | POA: Insufficient documentation

## 2023-10-26 DIAGNOSIS — J4540 Moderate persistent asthma, uncomplicated: Secondary | ICD-10-CM | POA: Insufficient documentation

## 2023-10-26 DIAGNOSIS — F339 Major depressive disorder, recurrent, unspecified: Secondary | ICD-10-CM | POA: Insufficient documentation

## 2023-10-26 DIAGNOSIS — M8580 Other specified disorders of bone density and structure, unspecified site: Secondary | ICD-10-CM | POA: Insufficient documentation

## 2023-10-26 DIAGNOSIS — E559 Vitamin D deficiency, unspecified: Secondary | ICD-10-CM | POA: Insufficient documentation

## 2023-10-26 DIAGNOSIS — I700 Atherosclerosis of aorta: Secondary | ICD-10-CM | POA: Insufficient documentation

## 2023-10-26 DIAGNOSIS — J4521 Mild intermittent asthma with (acute) exacerbation: Secondary | ICD-10-CM | POA: Diagnosis not present

## 2023-10-26 DIAGNOSIS — T464X5S Adverse effect of angiotensin-converting-enzyme inhibitors, sequela: Secondary | ICD-10-CM | POA: Insufficient documentation

## 2023-10-26 DIAGNOSIS — Z789 Other specified health status: Secondary | ICD-10-CM | POA: Insufficient documentation

## 2023-10-26 DIAGNOSIS — B351 Tinea unguium: Secondary | ICD-10-CM | POA: Insufficient documentation

## 2023-10-26 DIAGNOSIS — L578 Other skin changes due to chronic exposure to nonionizing radiation: Secondary | ICD-10-CM | POA: Insufficient documentation

## 2023-10-26 DIAGNOSIS — I2510 Atherosclerotic heart disease of native coronary artery without angina pectoris: Secondary | ICD-10-CM | POA: Insufficient documentation

## 2023-10-26 DIAGNOSIS — Z1211 Encounter for screening for malignant neoplasm of colon: Secondary | ICD-10-CM | POA: Insufficient documentation

## 2023-10-26 DIAGNOSIS — M19049 Primary osteoarthritis, unspecified hand: Secondary | ICD-10-CM | POA: Insufficient documentation

## 2023-10-26 DIAGNOSIS — E291 Testicular hypofunction: Secondary | ICD-10-CM | POA: Insufficient documentation

## 2023-10-26 DIAGNOSIS — I6529 Occlusion and stenosis of unspecified carotid artery: Secondary | ICD-10-CM | POA: Insufficient documentation

## 2023-10-26 DIAGNOSIS — F321 Major depressive disorder, single episode, moderate: Secondary | ICD-10-CM | POA: Insufficient documentation

## 2023-10-26 DIAGNOSIS — H8110 Benign paroxysmal vertigo, unspecified ear: Secondary | ICD-10-CM | POA: Insufficient documentation

## 2023-10-26 DIAGNOSIS — M1990 Unspecified osteoarthritis, unspecified site: Secondary | ICD-10-CM | POA: Insufficient documentation

## 2023-10-26 DIAGNOSIS — E785 Hyperlipidemia, unspecified: Secondary | ICD-10-CM | POA: Insufficient documentation

## 2023-10-26 DIAGNOSIS — Z720 Tobacco use: Secondary | ICD-10-CM | POA: Insufficient documentation

## 2023-10-26 DIAGNOSIS — M7740 Metatarsalgia, unspecified foot: Secondary | ICD-10-CM | POA: Insufficient documentation

## 2023-10-26 MED ORDER — ALBUTEROL SULFATE HFA 108 (90 BASE) MCG/ACT IN AERS: 2.0000 | INHALATION_SPRAY | RESPIRATORY_TRACT | 0 refills | Status: AC | PRN

## 2023-10-26 MED ORDER — DEXAMETHASONE SODIUM PHOSPHATE 10 MG/ML IJ SOLN
10.0000 mg | Freq: Once | INTRAMUSCULAR | Status: AC
  Administered 2023-10-26: 10 mg via INTRAMUSCULAR

## 2023-10-26 MED ORDER — PREDNISONE 20 MG PO TABS: 40.0000 mg | ORAL_TABLET | Freq: Every day | ORAL | 0 refills | Status: AC

## 2023-10-26 NOTE — ED Triage Notes (Signed)
"  I think these allergies have flared up my asthma the last few days but now getting to where I can't breath".

## 2023-10-26 NOTE — Discharge Instructions (Signed)
 You have been given a shot of dexamethasone 10 mg, steroid.  Albuterol inhaler--do 2 puffs every 4 hours as needed for shortness of breath or wheezing  Take prednisone 20 mg--2 daily for 5 days  If you worsen in any way please go to the emergency room for further evaluation

## 2023-10-26 NOTE — ED Provider Notes (Addendum)
 [facility]    CSN: [id number] Arrival date & time: 10/26/23  1519      History   Chief Complaint No chief complaint on file.   HPI Erik Wiggins is a 78 y.o. male.   HPI Here for feeling that his throat was closing and having trouble breathing.  This began about an hour prior to arrival.  He used his albuterol inhaler and it did help some.  This is the way his asthma usually presents.  No recent fever or cough or congestion.  He does not have diabetes  He is allergic to sulfa and lisinopril and has trouble taking bupropion.  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   Carotid US 05/27/2022: RICA 40-59, LICA 1-39, right subclavian stenosis   Umbilical hernia    Vitamin D deficiency     Patient Active Problem List   Diagnosis Date Noted   Abnormal stress ECG with treadmill 10/26/2023   ACE inhibitor intolerance 10/26/2023   Adverse reaction to ACE inhibitor drug, sequela 10/26/2023   Inflammatory arthritis  10/26/2023   Hand arthritis 10/26/2023   Onychomycosis 10/26/2023   Osteopenia 10/26/2023   Other skin changes due to chronic exposure to nonionizing radiation 10/26/2023   Osteoarthritis 10/26/2023   Androgen deficiency 10/26/2023   Tobacco user 10/26/2023   Vitamin D deficiency 10/26/2023   Atherosclerotic heart disease of native coronary artery without angina pectoris 10/26/2023   Aortic atherosclerosis (HCC) 10/26/2023   Mild atherosclerosis of carotid artery 10/26/2023   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   Hyperlipidemia 10/26/2023   Asthma, moderate persistent 10/26/2023   Morbid obesity (HCC) 10/26/2023   Recurrent depression (HCC) 10/26/2023   Moderate major depression, single episode (HCC) 10/26/2023   Metatarsalgia 10/26/2023   Chronic kidney disease due to hypertension 10/26/2023   Colon cancer screening 10/26/2023   Benign paroxysmal positional vertigo 10/26/2023   Allergy to sulfa drugs 10/26/2023   Obesity, unspecified 10/26/2023   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   Unilateral primary osteoarthritis, right knee 04/30/2022   Osteoarthritis of right knee 04/30/2022   Osteoarthritis of knee 03/30/2022   Osteoarthritis of left knee 03/27/2022   Carotid artery disease (HCC) 03/02/2022   CAD (coronary artery disease) 03/01/2022   Hyperlipidemia LDL goal <70 03/01/2022   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   Pain in thoracic spine 12/08/2021   Cervicalgia 12/08/2021   Bilateral primary osteoarthritis of knee 08/15/2021  Mixed hyperlipidemia 06/21/2021   Pre-diabetes 06/05/2021   Hypertriglyceridemia 06/05/2021   Metabolic syndrome 06/05/2021   HTN (hypertension) 05/28/2021   Obesity, Class III, BMI 40-49.9 (morbid obesity) (HCC) 05/28/2021   History of TIA (transient ischemic attack) 05/27/2021   Unilateral primary osteoarthritis, left knee 11/25/2020   History of nasal polyp 03/26/2020   Pain in left knee 02/01/2020   Umbilical hernia  02/09/2018   Moderate persistent asthma without complication 08/12/2016   Asthma, mild intermittent, well-controlled 12/22/2014   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   Mild intermittent asthma, uncomplicated 08/03/2014   Morbid (severe) obesity due to excess calories (HCC) 08/03/2014   Asthma 08/03/2014   Morbid obesity with BMI of 40.0-44.9, adult (HCC) 08/03/2014   Other allergic rhinitis 06/21/2014   Nasal polyps 06/21/2014   Obesity 06/21/2014   Nasal polyp 06/21/2014   Allergic rhinitis, seasonal 06/21/2014   OSA (obstructive sleep apnea) 06/05/2014   Obstructive sleep apnea (adult) (pediatric) 06/05/2014   Male erectile dysfunction, unspecified 11/09/2008   Endocrine disorder, unspecified 11/09/2008   Venous insufficiency 04/25/2004   Sleep apnea 03/23/2003   Hypercholesterolemia 03/16/2001   Dermatitis, atopic 03/07/2001   Essential hypertension 02/26/2000    Past Surgical History:  Procedure Laterality Date   CARDIAC CATHETERIZATION     COLONSCOPY     EYE SURGERY     cataracts   INSERTION OF MESH N/A 02/11/2018   Procedure: INSERTION OF MESH;  Surgeon: Darnell Level, MD;  Location: [facility];  Service: General;  Laterality: N/A;   UMBILICAL HERNIA REPAIR N/A 02/11/2018   Procedure: OPEN REPAIR UMBILICAL HERNIA;  Surgeon: Darnell Level, MD;  Location: [facility];  Service: General;  Laterality: N/A;       Home Medications    Prior to Admission medications   Medication Sig Start Date End Date Taking? Authorizing Provider  albuterol (VENTOLIN HFA) 108 (90 Base) MCG/ACT inhaler Inhale 2 puffs into the lungs every 4 (four) hours as needed for wheezing or shortness of breath. 10/26/23  Yes Lonza Shimabukuro, Janace Aris, MD  amoxicillin (AMOXIL) 500 MG capsule Take 500 mg by mouth 3 (three) times daily. 08/11/23  Yes [provider]  buPROPion (WELLBUTRIN XL) 150 MG 24 hr tablet Take 150 mg by mouth daily. 09/20/23  Yes [provider]   loratadine (CLARITIN) 10 MG tablet Take 10 mg by mouth daily. 08/11/21  Yes [provider]  predniSONE (DELTASONE) 20 MG tablet Take 2 tablets (40 mg total) by mouth daily with breakfast for 5 days. 10/26/23 10/31/23 Yes Noelle Hoogland, Janace Aris, MD  Semaglutide-Weight Management (WEGOVY) 0.25 MG/0.5ML SOAJ Inject 0.25 mg into the skin every 30 (thirty) days. 05/10/23  Yes [provider]  sildenafil (VIAGRA) 100 MG tablet Take 100 mg by mouth as needed. 06/01/11  Yes [provider]  amLODipine (NORVASC) 5 MG tablet Take 5 mg by mouth daily.    [provider]  azelastine (ASTELIN) 0.1 % nasal spray Place 1-2 sprays into both nostrils 2 (two) times daily as needed (nasal drainage). Use in each nostril as directed 06/06/21   Ellamae Sia, DO  buPROPion (WELLBUTRIN XL) 300 MG 24 hr tablet Take 300 mg by mouth daily.    [provider]  cetirizine (ZYRTEC) 10 MG tablet Take 10 mg by mouth daily.    [provider]  Cholecalciferol (VITAMIN D) 2000 units CAPS Take 2,000 Units by mouth 2 (two) times daily.    [provider]  clopidogrel (PLAVIX) 75 MG tablet  TAKE ONE TABLET BY MOUTH ONE TIME DAILY 09/30/23   Nahser, Deloris Ping, MD  cyclobenzaprine (FLEXERIL) 10 MG tablet Take 10 mg by mouth at bedtime.    [provider]  ezetimibe (ZETIA) 10 MG tablet TAKE ONE TABLET BY MOUTH ONE TIME DAILY 09/30/23   Nahser, Deloris Ping, MD  indapamide (LOZOL) 2.5 MG tablet Take 2.5 mg by mouth daily.  05/31/14   [provider]  ipratropium (ATROVENT) 0.03 % nasal spray Place 1 spray into both nostrils daily as needed for rhinitis. 05/16/14   [provider]  meclizine (ANTIVERT) 25 MG tablet Take 1 tablet (25 mg total) by mouth 3 (three) times daily as needed for dizziness. 09/28/23   Elson Areas, PA-C  montelukast (SINGULAIR) 10 MG tablet Take 10 mg by mouth at bedtime.  05/31/14   [provider]  rosuvastatin (CRESTOR) 20 MG  tablet Take 1 tablet (20 mg total) by mouth daily. 02/05/21   Nahser, Deloris Ping, MD  sertraline (ZOLOFT) 50 MG tablet Take 50 mg by mouth daily.  10/29/16   [provider]  Triamcinolone Acetonide (NASACORT ALLERGY 24HR NA) Place 1 spray into the nose daily as needed (congestion).    [provider]  TURMERIC CURCUMIN PO Take 1,500 mg by mouth daily.    [provider]    Family History Family History  Problem Relation Age of Onset   Healthy Mother    Heart attack Father 16   AAA (abdominal aortic aneurysm) Father    Healthy Son    Healthy Son    Healthy Son    Cancer Maternal Grandfather    Multiple sclerosis Son        youngest   Allergic rhinitis Neg Hx    Angioedema Neg Hx    Asthma Neg Hx    Atopy Neg Hx    Eczema Neg Hx    Immunodeficiency Neg Hx    Urticaria Neg Hx     Social History Social History   Tobacco Use   Smoking status: Former    Types: Cigars    Quit date: 07/21/1995    Years since quitting: 28.2   Smokeless tobacco: Never  Vaping Use   Vaping status: Never Used  Substance Use Topics   Alcohol use: Not Currently    Comment: social   Drug use: No     Allergies   Lisinopril, Sulfa antibiotics, and Sulfacetamide sodium   Review of Systems Review of Systems   Physical Exam Triage Vital Signs ED Triage Vitals  Encounter Vitals Group     BP 10/26/23 1522 (!) 145/75     Systolic BP Percentile --      Diastolic BP Percentile --      Pulse Rate 10/26/23 1522 77     Resp 10/26/23 1522 (!) 30     Temp 10/26/23 1522 97.6 F (36.4 C)     Temp Source 10/26/23 1522 Oral     SpO2 10/26/23 1522 96 %     Weight 10/26/23 1526 (!) 300 lb 0.7 oz (136.1 kg)     Height 10/26/23 1526 5\' 10"  (1.778 m)     Head Circumference --      Peak Flow --      Pain Score 10/26/23 1525 0     Pain Loc --      Pain Education --      Exclude from Growth Chart --    No data found.  Updated Vital Signs BP Marland Kitchen)  145/75 (BP Location: Left Arm)    Pulse 74   Temp 97.6 F (36.4 C) (Oral)   Resp (!) 30   Ht 5\' 10"  (1.778 m)   Wt (!) 136.1 kg   SpO2 93%   BMI 43.05 kg/m   Visual Acuity Right Eye Distance:   Left Eye Distance:   Bilateral Distance:    Right Eye Near:   Left Eye Near:    Bilateral Near:     Physical Exam Vitals reviewed.  Constitutional:      General: He is not in acute distress.    Appearance: He is not ill-appearing, toxic-appearing or diaphoretic.     Comments: No acute respiratory distress.  His O2 sat improved to 96% on room air, and initially it was 93%.  He was breathing about 30 times a minute when triage, and that it fallen to about 26 times a minute when I saw him.  HENT:     Nose: Nose normal.     Mouth/Throat:     Mouth: Mucous membranes are moist.     Comments: There is no swelling of the tongue or of the oropharynx.  Lips are not swollen. Eyes:     Extraocular Movements: Extraocular movements intact.     Pupils: Pupils are equal, round, and reactive to light.  Cardiovascular:     Rate and Rhythm: Normal rate.     Heart sounds: No murmur heard. Pulmonary:     Effort: Pulmonary effort is normal. No respiratory distress.     Breath sounds: Normal breath sounds. No stridor. No wheezing, rhonchi or rales.  Musculoskeletal:     Cervical back: Neck supple.  Lymphadenopathy:     Cervical: No cervical adenopathy.  Skin:    Coloration: Skin is not pale.  Neurological:     General: No focal deficit present.     Mental Status: He is alert and oriented to person, place, and time.  Psychiatric:        Behavior: Behavior normal.      UC Treatments / Results  Labs (all labs ordered are listed, but only abnormal results are displayed) Labs Reviewed - No data to display  EKG   Radiology No results found.  Procedures Procedures (including critical care time)  Medications Ordered in UC Medications  dexamethasone (DECADRON) injection 10 mg (10 mg Intramuscular Given 10/26/23 1548)     Initial Impression / Assessment and Plan / UC Course  I have reviewed the triage vital signs and the nursing notes.  Pertinent labs & imaging results that were available during my care of the patient were reviewed by me and considered in my medical decision making (see chart for details).     Albuterol inhaler is sent in as his 1 from home is expired.  Decadron Final Clinical Impressions(s) / UC Diagnoses   Final diagnoses:  Mild intermittent asthma with acute exacerbation     Discharge Instructions      You have been given a shot of dexamethasone 10 mg, steroid.  Albuterol inhaler--do 2 puffs every 4 hours as needed for shortness of breath or wheezing  Take prednisone 20 mg--2 daily for 5 days  If you worsen in any way please go to the emergency room for further evaluation     ED Prescriptions     Medication Sig Dispense Auth. Provider   albuterol (VENTOLIN HFA) 108 (90 Base) MCG/ACT inhaler Inhale 2 puffs into the lungs every 4 (four) hours as needed for wheezing or shortness  of breath. 1 each Zenia Resides, MD   predniSONE (DELTASONE) 20 MG tablet Take 2 tablets (40 mg total) by mouth daily with breakfast for 5 days. 10 tablet Marlinda Mike Janace Aris, MD      PDMP not reviewed this encounter.   Zenia Resides, MD 10/26/23 1553    Zenia Resides, MD 10/26/23 [phone]

## 2023-10-26 NOTE — ED Notes (Signed)
"  I did recently started 2 different medications from my doctor for anxiety/depression and had to go the ED and stopped taking them". Names (unknown to review).

## 2023-10-27 ENCOUNTER — Other Ambulatory Visit: Payer: Self-pay | Admitting: Cardiovascular Disease

## 2023-10-27 DIAGNOSIS — F321 Major depressive disorder, single episode, moderate: Secondary | ICD-10-CM | POA: Diagnosis not present

## 2023-10-27 DIAGNOSIS — R5381 Other malaise: Secondary | ICD-10-CM | POA: Diagnosis not present

## 2023-11-19 DIAGNOSIS — M170 Bilateral primary osteoarthritis of knee: Secondary | ICD-10-CM | POA: Diagnosis not present

## 2023-11-23 DIAGNOSIS — E669 Obesity, unspecified: Secondary | ICD-10-CM | POA: Diagnosis not present

## 2023-11-23 DIAGNOSIS — G4730 Sleep apnea, unspecified: Secondary | ICD-10-CM | POA: Diagnosis not present

## 2023-11-23 DIAGNOSIS — G4733 Obstructive sleep apnea (adult) (pediatric): Secondary | ICD-10-CM | POA: Diagnosis not present

## 2023-12-26 ENCOUNTER — Other Ambulatory Visit: Payer: Self-pay | Admitting: Cardiovascular Disease

## 2024-01-07 DIAGNOSIS — M170 Bilateral primary osteoarthritis of knee: Secondary | ICD-10-CM | POA: Diagnosis not present

## 2024-01-14 DIAGNOSIS — M170 Bilateral primary osteoarthritis of knee: Secondary | ICD-10-CM | POA: Diagnosis not present

## 2024-01-20 DIAGNOSIS — M170 Bilateral primary osteoarthritis of knee: Secondary | ICD-10-CM | POA: Diagnosis not present

## 2024-01-26 ENCOUNTER — Other Ambulatory Visit: Payer: Self-pay | Admitting: Cardiovascular Disease

## 2024-02-16 DIAGNOSIS — M170 Bilateral primary osteoarthritis of knee: Secondary | ICD-10-CM | POA: Diagnosis not present

## 2024-02-24 DIAGNOSIS — H938X3 Other specified disorders of ear, bilateral: Secondary | ICD-10-CM | POA: Diagnosis not present

## 2024-02-24 DIAGNOSIS — H6123 Impacted cerumen, bilateral: Secondary | ICD-10-CM | POA: Diagnosis not present

## 2024-02-25 ENCOUNTER — Telehealth: Payer: Self-pay | Admitting: Cardiology

## 2024-02-25 NOTE — Telephone Encounter (Signed)
*  STAT* If patient is at the pharmacy, call can be transferred to refill team.   1. Which medications need to be refilled? (please list name of each medication and dose if known)   clopidogrel  (PLAVIX ) 75 MG tablet     4. Which pharmacy/location (including street and city if local pharmacy) is medication to be sent to?  [id number] - [place], [place] - [address]     5. Do they need a 30 day or 90 day supply? 90     Scheduled 10/6

## 2024-02-28 DIAGNOSIS — H5315 Visual distortions of shape and size: Secondary | ICD-10-CM | POA: Diagnosis not present

## 2024-02-28 DIAGNOSIS — H04123 Dry eye syndrome of bilateral lacrimal glands: Secondary | ICD-10-CM | POA: Diagnosis not present

## 2024-02-28 DIAGNOSIS — H53411 Scotoma involving central area, right eye: Secondary | ICD-10-CM | POA: Diagnosis not present

## 2024-02-28 DIAGNOSIS — D3131 Benign neoplasm of right choroid: Secondary | ICD-10-CM | POA: Diagnosis not present

## 2024-02-28 DIAGNOSIS — H35373 Puckering of macula, bilateral: Secondary | ICD-10-CM | POA: Diagnosis not present

## 2024-03-22 DIAGNOSIS — Z23 Encounter for immunization: Secondary | ICD-10-CM | POA: Diagnosis not present

## 2024-03-22 DIAGNOSIS — M1990 Unspecified osteoarthritis, unspecified site: Secondary | ICD-10-CM | POA: Diagnosis not present

## 2024-03-22 DIAGNOSIS — M79642 Pain in left hand: Secondary | ICD-10-CM | POA: Diagnosis not present

## 2024-03-22 DIAGNOSIS — M170 Bilateral primary osteoarthritis of knee: Secondary | ICD-10-CM | POA: Diagnosis not present

## 2024-03-23 DIAGNOSIS — M79642 Pain in left hand: Secondary | ICD-10-CM | POA: Diagnosis not present

## 2024-03-23 DIAGNOSIS — M170 Bilateral primary osteoarthritis of knee: Secondary | ICD-10-CM | POA: Diagnosis not present

## 2024-03-23 DIAGNOSIS — Z6841 Body Mass Index (BMI) 40.0 and over, adult: Secondary | ICD-10-CM | POA: Diagnosis not present

## 2024-03-24 ENCOUNTER — Telehealth: Payer: Self-pay | Admitting: Cardiology

## 2024-03-24 MED ORDER — EZETIMIBE 10 MG PO TABS
10.0000 mg | ORAL_TABLET | Freq: Every day | ORAL | 0 refills | Status: DC
Start: 2024-03-24 — End: 2024-04-19

## 2024-03-24 NOTE — Telephone Encounter (Signed)
*  STAT* If patient is at the pharmacy, call can be transferred to refill team.   1. Which medications need to be refilled? (please list name of each medication and dose if known)   ezetimibe  (ZETIA ) 10 MG tablet   2. Would you like to learn more about the convenience, safety, & potential cost savings by using the [facility]?   3. Are you open to using the [facility] (Type [facility]. ).  4. Which pharmacy/location (including street and city if local pharmacy) is medication to be sent to?  [id number] - [place],  - [address]   5. Do they need a 30 day or 90 day supply?   90 day  Patient stated he is completely out of this medication.  Patient has appointment with Dr. Anner on 10/6.

## 2024-03-24 NOTE — Telephone Encounter (Signed)
 Pt's medication was sent to pt's pharmacy as requested. Confirmation received.

## 2024-03-29 DIAGNOSIS — M79642 Pain in left hand: Secondary | ICD-10-CM | POA: Diagnosis not present

## 2024-04-13 DIAGNOSIS — Z961 Presence of intraocular lens: Secondary | ICD-10-CM | POA: Diagnosis not present

## 2024-04-13 DIAGNOSIS — H01023 Squamous blepharitis right eye, unspecified eyelid: Secondary | ICD-10-CM | POA: Diagnosis not present

## 2024-04-13 DIAGNOSIS — H01026 Squamous blepharitis left eye, unspecified eyelid: Secondary | ICD-10-CM | POA: Diagnosis not present

## 2024-04-13 DIAGNOSIS — H34231 Retinal artery branch occlusion, right eye: Secondary | ICD-10-CM | POA: Diagnosis not present

## 2024-04-13 DIAGNOSIS — H40023 Open angle with borderline findings, high risk, bilateral: Secondary | ICD-10-CM | POA: Diagnosis not present

## 2024-04-13 DIAGNOSIS — H18832 Recurrent erosion of cornea, left eye: Secondary | ICD-10-CM | POA: Diagnosis not present

## 2024-04-13 DIAGNOSIS — H04123 Dry eye syndrome of bilateral lacrimal glands: Secondary | ICD-10-CM | POA: Diagnosis not present

## 2024-04-19 ENCOUNTER — Other Ambulatory Visit: Payer: Self-pay | Admitting: Physician Assistant

## 2024-04-24 ENCOUNTER — Ambulatory Visit: Attending: Cardiology | Admitting: Cardiology

## 2024-04-24 ENCOUNTER — Encounter: Payer: Self-pay | Admitting: Cardiology

## 2024-04-24 VITALS — BP 119/60 | HR 69 | Ht 70.0 in | Wt 305.0 lb

## 2024-04-24 DIAGNOSIS — Z6841 Body Mass Index (BMI) 40.0 and over, adult: Secondary | ICD-10-CM | POA: Diagnosis not present

## 2024-04-24 DIAGNOSIS — E8881 Metabolic syndrome: Secondary | ICD-10-CM | POA: Diagnosis not present

## 2024-04-24 DIAGNOSIS — Z8673 Personal history of transient ischemic attack (TIA), and cerebral infarction without residual deficits: Secondary | ICD-10-CM | POA: Diagnosis not present

## 2024-04-24 DIAGNOSIS — Z01810 Encounter for preprocedural cardiovascular examination: Secondary | ICD-10-CM | POA: Diagnosis not present

## 2024-04-24 DIAGNOSIS — I2510 Atherosclerotic heart disease of native coronary artery without angina pectoris: Secondary | ICD-10-CM | POA: Insufficient documentation

## 2024-04-24 DIAGNOSIS — I6523 Occlusion and stenosis of bilateral carotid arteries: Secondary | ICD-10-CM | POA: Insufficient documentation

## 2024-04-24 DIAGNOSIS — M25562 Pain in left knee: Secondary | ICD-10-CM | POA: Insufficient documentation

## 2024-04-24 DIAGNOSIS — I10 Essential (primary) hypertension: Secondary | ICD-10-CM | POA: Insufficient documentation

## 2024-04-24 DIAGNOSIS — E785 Hyperlipidemia, unspecified: Secondary | ICD-10-CM | POA: Insufficient documentation

## 2024-04-24 DIAGNOSIS — G8929 Other chronic pain: Secondary | ICD-10-CM | POA: Diagnosis not present

## 2024-04-24 NOTE — Assessment & Plan Note (Signed)
 Hopefully with metabolic syndrome components, he can get medication assistance for GLP-1 agonist-if not able to get Wegovy, perhaps Zepbound would be more potent as a combined agent.

## 2024-04-24 NOTE — Progress Notes (Signed)
 Cardiology Office Note:  .   Date:  04/24/2024  ID:  Erik Wiggins, DOB 08-25-1945, MRN [id number] PCP: Clarice Nottingham, MD  [facility] Providers Cardiologist:  Alm Clay, MD     Chief Complaint  Patient presents with   Follow-up    Has been 2 years since last visit; needs medication refill and preop evaluation for potential knee surgery.    Patient Profile: .     Erik Wiggins is a morbidly obese 78 y.o. male with a PMH notable for HTN, HLD & h/o TIA who presents here for delayed f/u & Pre-Op CV Evaluation for L Knee Sgx at the request of Clarice Nottingham, MD.  He is a former patient of Dr. Aleene Passe PMH :   History of TIA (2022) MRI of the brain revealed chronic small vessel ischemic disease (referred by Optho to Neuro) Carotid artery disease-CTA head and neck showed left ICA 50 to 60% stenosis with minimal irregularities in the right. On clopidogrel  75 mg daily per neurology HTN managed with Dr. Rolm on amlodipine 5 mg daily HLD -> on Crestor  20 mg daily along with Zetia  10 mg daily Morbid obesity-has not yet started Wegovy-noting cost.     Erik Wiggins was last seen in November 2023 by Dr. Passe -> echo had been ordered.  Discussed weight loss options with dietary changes.  Recommended increasing exercise.  Subjective  Discussed the use of AI scribe software for clinical note transcription with the patient, who gave verbal consent to proceed.  History of Present Illness Erik Wiggins is a 78 year old male who presents for evaluation of left knee pain and preoperative clearance for knee replacement surgery.  He has persistent left knee pain, which has significantly limited his activity levels. No pain is reported in other parts of his leg.  He has a history of a transient ischemic attack (TIA) and is currently taking Plavix . No recent episodes of rapid or irregular heartbeats, vision changes, weakness, slurred speech, or drooling. No ankle  swelling, chest pain, or pressure. He experiences shortness of breath when bending over but not during walking or other activities.  His medication regimen includes sertraline  50 mg, bupropion, Zetia  10 mg, Crestor  20 mg, amlodipine 5 mg, Singulair  daily, and albuterol  as needed. He also uses Atrovent and Astelin  sprays as needed. He has not been taking Wegovy due to cost issues.  He recalls a 40% blockage identified in a previous cardiac evaluation about a year ago, and an echocardiogram was performed in February. He was hospitalized for COVID-19 earlier this year, during which his depression medication was adjusted, leading to an emergency room visit due to adverse effects. He is currently stable on sertraline  and bupropion.  His cholesterol levels were last checked in February, showing a total cholesterol of 123, triglycerides of 155, HDL of 46, and LDL of 51. His hemoglobin was 11.9, BUN was normal, creatinine was 0.89, and potassium was 3.2. His TSH was 1.33. He is not diabetic.  Cardiovascular ROS: no chest pain or dyspnea on exertion negative for - edema, irregular heartbeat, loss of consciousness, orthopnea, palpitations, paroxysmal nocturnal dyspnea, rapid heart rate, shortness of breath, or near syncope, TIA/CVA/amaurosis fugax, claudication  ROS:  Review of Systems - Negative except L knee OA pain - limits walking.    Objective   Current Meds  Medication Sig   albuterol  (VENTOLIN  HFA) 108 (90 Base) MCG/ACT inhaler Inhale 2 puffs into the lungs every 4 (four) hours as  needed for wheezing or shortness of breath.   amLODipine (NORVASC) 5 MG tablet Take 5 mg by mouth daily.   azelastine  (ASTELIN ) 0.1 % nasal spray Place 1-2 sprays into both nostrils 2 (two) times daily as needed (nasal drainage). Use in each nostril as directed   buPROPion (WELLBUTRIN XL) 150 MG 24 hr tablet Take 150 mg by mouth daily. => Patient is not clear if he is taking   buPROPion (WELLBUTRIN XL) 300 MG 24 hr  tablet Take 300 mg by mouth daily. => Patient is not clear if he is taking   cetirizine (ZYRTEC) 10 MG tablet Take 10 mg by mouth daily.   Cholecalciferol  (VITAMIN D ) 2000 units CAPS Take 2,000 Units by mouth 2 (two) times daily.   clopidogrel  (PLAVIX ) 75 MG tablet TAKE ONE TABLET BY MOUTH ONE TIME DAILY -> Managed by Neurology   cyclobenzaprine (FLEXERIL) 10 MG tablet Take 10 mg by mouth at bedtime.   ezetimibe  (ZETIA ) 10 MG tablet Take 1 tablet (10 mg total) by mouth daily. !   indapamide (LOZOL) 2.5 MG tablet Take 2.5 mg by mouth daily.    ipratropium (ATROVENT) 0.03 % nasal spray Place 1 spray into both nostrils daily as needed for rhinitis.   loratadine  (CLARITIN ) 10 MG tablet Take 10 mg by mouth daily.   meclizine  (ANTIVERT ) 25 MG tablet Take 1 tablet (25 mg total) by mouth 3 (three) times daily as needed for dizziness.   montelukast  (SINGULAIR ) 10 MG tablet Take 10 mg by mouth at bedtime.    rosuvastatin  (CRESTOR ) 20 MG tablet Take 1 tablet (20 mg total) by mouth daily.   Semaglutide-Weight Management (WEGOVY) 0.25 MG/0.5ML SOAJ Inject 0.25 mg into the skin every 30 (thirty) days. ->  Patient has not yet started   sertraline  (ZOLOFT ) 50 MG tablet Take 50 mg by mouth daily.    sildenafil (VIAGRA) 100 MG tablet Take 100 mg by mouth as needed. => Patient no longer using   Triamcinolone  Acetonide (NASACORT ALLERGY  24HR NA) Place 1 spray into the nose daily as needed (congestion).   TURMERIC CURCUMIN PO Take 1,500 mg by mouth daily.    Studies Reviewed: SABRA   EKG Interpretation Date/Time:  Monday April 24 2024 10:22:49 EDT Ventricular Rate:  65 PR Interval:  150 QRS Duration:  82 QT Interval:  382 QTC Calculation: 397 R Axis:   8  Text Interpretation: Sinus rhythm with Premature atrial complexes Nonspecific T wave abnormality When compared with ECG of 28-Sep-2023 18:06, PREVIOUS ECG IS PRESENT Confirmed by Anner Lenis ([id number]) on 04/24/2024 10:34:36 AM    Results LABS Total  cholesterol: 123 (09/16/2023) Triglycerides: 155 (09/16/2023) HDL: 46 (09/16/2023) LDL: 51 (09/16/2023) Hemoglobin: 11.9 (09/28/2023) BUN: 8.9 (09/28/2023) Creatinine: 0.89 (09/28/2023) Potassium: 3.2 (09/28/2023) TSH: 1.33 (09/28/2023)  Myoview  July 2017: LOW RISK.  EF 64%.  No ischemia or infarction. ECHO February 2025: EF 60 to 65%.  No RWMA.  G1 DD.  Mild biatrial dilation.  Normal valves. Carotid Dopplers December 2024: Right ICA low end of 40 to 59%, left ICA less than 40%.   Risk Assessment/Calculations:                Mr. Fatzinger perioperative risk of a major cardiac event is 0.9% according to the Revised Cardiac Risk Index (RCRI).  Therefore, he is at low risk for perioperative complications.   His functional capacity is fair at 6.27 METs according to the Duke Activity Status Index (DASI). Recommendations: According to ACC/AHA guidelines, no further cardiovascular  testing needed.  The patient may proceed to surgery at acceptable risk.   Antiplatelet and/or Anticoagulation Recommendations: Clopidogrel  (Plavix ) can be held for 5 days prior to his surgery and resumed as soon as possible post op. No other medications adjustments required.  No further testing required.    Physical Exam:   VS:  BP 119/60 (BP Location: Left Arm, Patient Position: Sitting, Cuff Size: Large)   Pulse 69   Ht 5' 10 (1.778 m)   Wt (!) 305 lb (138.3 kg)   SpO2 95%   BMI 43.76 kg/m    Wt Readings from Last 3 Encounters:  04/24/24 (!) 305 lb (138.3 kg)  10/26/23 (!) 300 lb 0.7 oz (136.1 kg)  09/28/23 300 lb (136.1 kg)      GEN: Morbidly obese with mostly truncal obesity., well groomed; in no acute distress; NECK: No JVD; No carotid bruits CARDIAC: Normal S1, S2; RRR, no murmurs, rubs, gallops RESPIRATORY:  Clear to auscultation without rales, wheezing or rhonchi ; nonlabored, good air movement. ABDOMEN: Soft, non-tender, non-distended EXTREMITIES:  No edema; No deformity ; palpable radial  and pedal pulses.     ASSESSMENT AND PLAN: .    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  Stress test in 2017 was nonischemic, echo in 2025 was essentially normal.  Remains on clopidogrel  75 mg daily for stroke prophylaxis along with Crestor  20 mg and Zetia  10 mg daily for lipids. He is on amlodipine for blood pressure this well-controlled.  In the absence of any active cardiac symptoms, would not consider the need for any further cardiac testing with stress test prior to surgery.      RESOLVED: CAD (coronary artery disease) - Primary (Chronic)   Relevant Orders   EKG 12-Lead (Completed)   Carotid artery disease (Chronic)   Moderate carotid disease on Dopplers December 2024 - Would consider follow-up Dopplers in 2026      Essential hypertension (Chronic)   Well-controlled blood pressure on amlodipine 5 mg daily. -Continue amlodipine.      Hyperlipidemia LDL goal <70 (Chronic)   With modest carotid artery disease and Coronary Calcium  Score of 500, goal LDL should be at least less than 70. LDL as of February 2025 was 51 with total cholesterol 07/20/2021 and total strides of 155.  Pretty well-controlled on current regimen. - Continue current medications: Zetia  10 mg and Crestor  20 mg.        Other   History of TIA (transient ischemic attack) (Chronic)   No recent neurological symptoms. On Plavix  for stroke prevention. - Continue Plavix  for stroke prevention along with amlodipine for blood pressure control and statin plus Zetia  for lipid management. - Hold Plavix  five days prior to knee surgery.      Metabolic syndrome (Chronic)   Hopefully with metabolic syndrome components, he can get medication assistance for GLP-1 agonist-if not able  to get Wegovy, perhaps Zepbound would be more potent as a combined agent.      Morbid obesity with BMI of 40.0-44.9, adult (HCC) (Chronic)   Morbidly obese.  Needs to lose weight for knee surgery.  He supposed be on Wegovy but having issues with cost. Will defer to PCP but hopefully we can get him on [facility] for combination of prediabetes  with an A1c of 6 back in November 2022, obesity and hypertension meeting criteria for metabolic syndrome.      Pain in left knee (Chronic)   Chronic osteoarthritis causing significant discomfort and mobility limitation. Low cardiac risk for surgery. - Proceed with knee replacement surgery after weight loss. - The orthopedic office will send the request for preoperative clearance to our office, and our PA will review and respond. - Hold Plavix  five days prior to surgery.      Preop cardiovascular exam => primary   Preop evaluation for Left Knee Surgery (likely arthroplasty) => pending reevaluation by surgeon from [facility]   Mr. Cokley perioperative risk of a major cardiac event is 0.9% according to the Revised Cardiac Risk Index (RCRI).  Therefore, he is at low risk for perioperative complications.   His functional capacity is fair at 6.27 METs according to the Duke Activity Status Index (DASI). Recommendations: According to ACC/AHA guidelines, no further cardiovascular testing needed.  The patient may proceed to surgery at acceptable risk.   Antiplatelet and/or Anticoagulation Recommendations: Clopidogrel  (Plavix ) can be held for 5 days prior to his surgery and resumed as soon as possible post op. No other medications adjustments required.  No further testing required.               Follow-Up: Return in about 1 year (around 04/24/2025) for [employer], Routine follow up with me.     Signed, Alm MICAEL Clay, MD, MS Alm Clay, M.D., M.S. Interventional Cardiologist  [facility] Pager # [phone]

## 2024-04-24 NOTE — Assessment & Plan Note (Signed)
 With modest carotid artery disease and Coronary Calcium  Score of 500, goal LDL should be at least less than 70. LDL as of February 2025 was 51 with total cholesterol 07/20/2021 and total strides of 155.  Pretty well-controlled on current regimen. - Continue current medications: Zetia  10 mg and Crestor  20 mg.

## 2024-04-24 NOTE — Assessment & Plan Note (Signed)
 Moderate carotid disease on Dopplers December 2024 - Would consider follow-up Dopplers in 2026

## 2024-04-24 NOTE — Assessment & Plan Note (Signed)
 No recent neurological symptoms. On Plavix  for stroke prevention. - Continue Plavix  for stroke prevention along with amlodipine for blood pressure control and statin plus Zetia  for lipid management. - Hold Plavix  five days prior to knee surgery.

## 2024-04-24 NOTE — Assessment & Plan Note (Deleted)
 Coronary Calcium  Score in March 2022 was 552 (50-70 5th percentile) Continues to do well with no active anginal symptoms.  Stress test in 2017 was nonischemic, echo in 2025 was essentially normal.  Remains on clopidogrel  75 mg daily for stroke prophylaxis along with Crestor  20 mg and Zetia  10 mg daily for lipids. He is on amlodipine for blood pressure this well-controlled.  In the absence of any active cardiac symptoms, would not consider the need for any further cardiac testing with stress test prior to surgery.

## 2024-04-24 NOTE — Assessment & Plan Note (Signed)
 Morbidly obese.  Needs to lose weight for knee surgery.  He supposed be on Wegovy but having issues with cost. Will defer to PCP but hopefully we can get him on [facility] for combination of prediabetes with an A1c of 6 back in November 2022, obesity and hypertension meeting criteria for metabolic syndrome.

## 2024-04-24 NOTE — Assessment & Plan Note (Signed)
 Coronary Calcium  Score in March 2022 was 552 (50-70 5th percentile) Continues to do well with no active anginal symptoms.  Stress test in 2017 was nonischemic, echo in 2025 was essentially normal.  Remains on clopidogrel  75 mg daily for stroke prophylaxis along with Crestor  20 mg and Zetia  10 mg daily for lipids. He is on amlodipine for blood pressure this well-controlled.  In the absence of any active cardiac symptoms, would not consider the need for any further cardiac testing with stress test prior to surgery.

## 2024-04-24 NOTE — Assessment & Plan Note (Signed)
 Well-controlled blood pressure on amlodipine 5 mg daily. -Continue amlodipine.

## 2024-04-24 NOTE — Assessment & Plan Note (Signed)
 Chronic osteoarthritis causing significant discomfort and mobility limitation. Low cardiac risk for surgery. - Proceed with knee replacement surgery after weight loss. - The orthopedic office will send the request for preoperative clearance to our office, and our PA will review and respond. - Hold Plavix  five days prior to surgery.

## 2024-04-24 NOTE — Patient Instructions (Addendum)
 Medication Instructions:  No changes   *If you need a refill on your cardiac medications before your next appointment, please call your pharmacy*   Lab Work: Not needed   Testing/Procedures: Not needed  Follow-Up: At [facility], you and your health needs are our priority.  As part of our continuing mission to provide you with exceptional heart care, we have created designated Provider Care Teams.  These Care Teams include your primary Cardiologist (physician) and Advanced Practice Providers (APPs -  Physician Assistants and Nurse Practitioners) who all work together to provide you with the care you need, when you need it.     Your next appointment:   12 month(s)  The format for your next appointment:   In Person  Provider:   Bryan Lemma, MD    Other Instructions

## 2024-04-24 NOTE — Assessment & Plan Note (Signed)
 Preop evaluation for Left Knee Surgery (likely arthroplasty) => pending reevaluation by surgeon from [facility]   Erik Wiggins perioperative risk of a major cardiac event is 0.9% according to the Revised Cardiac Risk Index (RCRI).  Therefore, he is at low risk for perioperative complications.   His functional capacity is fair at 6.27 METs according to the Duke Activity Status Index (DASI). Recommendations: According to ACC/AHA guidelines, no further cardiovascular testing needed.  The patient may proceed to surgery at acceptable risk.   Antiplatelet and/or Anticoagulation Recommendations: Clopidogrel  (Plavix ) can be held for 5 days prior to his surgery and resumed as soon as possible post op. No other medications adjustments required.  No further testing required.

## 2024-05-19 ENCOUNTER — Other Ambulatory Visit: Payer: Self-pay | Admitting: Cardiology

## 2024-06-07 DIAGNOSIS — Z6841 Body Mass Index (BMI) 40.0 and over, adult: Secondary | ICD-10-CM | POA: Diagnosis not present

## 2024-06-07 DIAGNOSIS — Z9181 History of falling: Secondary | ICD-10-CM | POA: Diagnosis not present

## 2024-06-07 DIAGNOSIS — M170 Bilateral primary osteoarthritis of knee: Secondary | ICD-10-CM | POA: Diagnosis not present

## 2024-07-26 ENCOUNTER — Ambulatory Visit (HOSPITAL_COMMUNITY)
Admission: RE | Admit: 2024-07-26 | Discharge: 2024-07-26 | Disposition: A | Discharge status: Home / Self Care | Source: Ambulatory Visit | Attending: Internal Medicine | Admitting: Internal Medicine

## 2024-07-26 ENCOUNTER — Ambulatory Visit: Payer: Self-pay | Admitting: Physician Assistant

## 2024-07-26 DIAGNOSIS — G459 Transient cerebral ischemic attack, unspecified: Secondary | ICD-10-CM | POA: Diagnosis not present

## 2024-07-26 DIAGNOSIS — I10 Essential (primary) hypertension: Secondary | ICD-10-CM | POA: Diagnosis not present

## 2024-07-26 DIAGNOSIS — I77810 Thoracic aortic ectasia: Secondary | ICD-10-CM

## 2024-07-26 DIAGNOSIS — I7121 Aneurysm of the ascending aorta, without rupture: Secondary | ICD-10-CM | POA: Insufficient documentation

## 2024-07-26 LAB — ECHOCARDIOGRAM COMPLETE
Area-P 1/2: 3.03 cm2
S' Lateral: 2.9 cm

## 2024-07-27 NOTE — Telephone Encounter (Signed)
 Yes sir. The decoded version is that there is not much change from the last Echo and we will get an Echo in a year to make sure there are no changes.

## 2024-08-11 ENCOUNTER — Telehealth: Payer: Self-pay | Admitting: Cardiology

## 2024-08-11 NOTE — Telephone Encounter (Signed)
 Pt c/o medication issue:  1. Name of Medication:   ezetimibe  (ZETIA ) 10 MG tablet    2. How are you currently taking this medication (dosage and times per day)? As prescribed   3. Are you having a reaction (difficulty breathing--STAT)? No   4. What is your medication issue? Rx was $90 and pt asking for alternatives.

## 2024-08-14 ENCOUNTER — Telehealth: Payer: Self-pay | Admitting: Pharmacy Technician

## 2024-08-14 ENCOUNTER — Other Ambulatory Visit (HOSPITAL_COMMUNITY): Payer: Self-pay

## 2024-08-14 NOTE — Telephone Encounter (Signed)
 Lets ask for a tier exceptio please. If not he can use good rx for cheaper

## 2024-08-14 NOTE — Telephone Encounter (Signed)
 [facility] not open for me to ask about his 08/10/24 fill of ezetimibe  10mg    Tried to do tier exception but it would not allow it  Will try [facility] later

## 2024-08-15 NOTE — Telephone Encounter (Signed)
 I called [facility] and cost is 90 due to deductible he has a 700 deductible. They said they can run it on his [facility] membership and 19.00. I called the patient and he is aware
